# Patient Record
Sex: Female | Born: 1980
Health system: Southern US, Community
[De-identification: ages and names within clinical notes are randomized; demographics above are authoritative.]

## PROBLEM LIST (undated history)

## (undated) ENCOUNTER — Ambulatory Visit (HOSPITAL_COMMUNITY): Source: Home / Self Care

## (undated) DIAGNOSIS — O24419 Gestational diabetes mellitus in pregnancy, unspecified control: Secondary | ICD-10-CM

## (undated) DIAGNOSIS — Z5189 Encounter for other specified aftercare: Secondary | ICD-10-CM

## (undated) DIAGNOSIS — F419 Anxiety disorder, unspecified: Secondary | ICD-10-CM

## (undated) DIAGNOSIS — O139 Gestational [pregnancy-induced] hypertension without significant proteinuria, unspecified trimester: Secondary | ICD-10-CM

## (undated) DIAGNOSIS — E119 Type 2 diabetes mellitus without complications: Secondary | ICD-10-CM

## (undated) DIAGNOSIS — D696 Thrombocytopenia, unspecified: Secondary | ICD-10-CM

## (undated) DIAGNOSIS — I10 Essential (primary) hypertension: Secondary | ICD-10-CM

## (undated) HISTORY — DX: Encounter for other specified aftercare: Z51.89

## (undated) HISTORY — DX: Anxiety disorder, unspecified: F41.9

## (undated) HISTORY — DX: Gestational diabetes mellitus in pregnancy, unspecified control: O24.419

## (undated) HISTORY — DX: Gestational (pregnancy-induced) hypertension without significant proteinuria, unspecified trimester: O13.9

## (undated) NOTE — *Deleted (*Deleted)
HEMATOLOGY/ONCOLOGY CONSULTATION NOTE  Date of Service: 06/18/2020  Patient Care Team: Care, Jovita Kussmaul [facility] as PCP - General (Family Medicine)  CHIEF COMPLAINTS/PURPOSE OF CONSULTATION:  Thrombocytopenia in pregnancy  HISTORY OF PRESENTING ILLNESS:  Terri Bradford is a wonderful 58 y.o. female who has been referred to [place] by Dr Oneita Hurt for evaluation and management of thrombocytopenia in pregnancy. Pt is accompanied today by ***. The pt reports that she is doing well overall.   The pt reports ***   Of note prior to the patient's visit today, pt has had *** completed on *** with results revealing ***.   Most recent lab results (06/17/2020) of CBC is as follows: all values are WNL except for RBC at 3.10, Hgb at 10.5, HCT at 30.5, MCV at 98, MCH at 33.9, PLT at 75K.  On review of systems, pt reports *** and denies ***and any other symptoms.   On PMHx the pt reports ***. On Social Hx the pt reports *** On Family Hx the pt reports ***  A: -Discussed patient's most recent labs from ***, *** -***  MEDICAL HISTORY:  Past Medical History:  Diagnosis Date  . Anxiety   . Blood transfusion without reported diagnosis   . Diabetes mellitus without complication (HCC)   . Hypertension   . Pregnancy induced hypertension   . Thrombocytopenia, unspecified (HCC)    Thrombocytopenia    SURGICAL HISTORY: Past Surgical History:  Procedure Laterality Date  . CESAREAN SECTION      SOCIAL HISTORY: Social History   Socioeconomic History  . Marital status: Single    Spouse name: Not on file  . Number of children: 2  . Years of education: Not on file  . Highest education level: Not on file  Occupational History  . Occupation: unemployed  Tobacco Use  . Smoking status: Current Every Day Smoker    Packs/day: 0.00    Types: Cigarettes  . Smokeless tobacco: Never Used  Vaping Use  . Vaping Use: Never used  Substance and Sexual Activity  . Alcohol use: Not Currently  .  Drug use: No  . Sexual activity: Yes    Birth control/protection: None  Other Topics Concern  . Not on file  Social History Narrative  . Not on file   Social Determinants of Health   Financial Resource Strain:   . Difficulty of Paying Living Expenses: Not on file  Food Insecurity:   . Worried About Programme researcher, broadcasting/film/video in the Last Year: Not on file  . Ran Out of Food in the Last Year: Not on file  Transportation Needs:   . Lack of Transportation (Medical): Not on file  . Lack of Transportation (Non-Medical): Not on file  Physical Activity:   . Days of Exercise per Week: Not on file  . Minutes of Exercise per Session: Not on file  Stress:   . Feeling of Stress : Not on file  Social Connections:   . Frequency of Communication with Friends and Family: Not on file  . Frequency of Social Gatherings with Friends and Family: Not on file  . Attends Religious Services: Not on file  . Active Member of Clubs or Organizations: Not on file  . Attends Banker Meetings: Not on file  . Marital Status: Not on file  Intimate Partner Violence:   . Fear of Current or Ex-Partner: Not on file  . Emotionally Abused: Not on file  . Physically Abused: Not on file  .  Sexually Abused: Not on file    FAMILY HISTORY: Family History  Problem Relation Age of Onset  . Hypertension Mother   . COPD Mother   . Anxiety disorder Sister   . Diabetes Sister     ALLERGIES:  has No Known Allergies.  MEDICATIONS:  Current Outpatient Medications  Medication Sig Dispense Refill  . aspirin EC 81 MG tablet Take 1 tablet (81 mg total) by mouth daily. Take after 12 weeks for prevention of preeclampsia later in pregnancy 300 tablet 2  . Blood Pressure Monitoring (BLOOD PRESSURE KIT) DEVI 1 kit by Does not apply route once a week. Check Blood Pressure regularly and record readings into the [employer] App.  Large Cuff.  DX O90.0 1 each 0  . labetalol (NORMODYNE) 200 MG tablet Take 1 tablet (200 mg  total) by mouth 2 (two) times daily. 60 tablet 3  . metFORMIN (GLUCOPHAGE) 500 MG tablet Take 1 tablet (500 mg total) by mouth 2 (two) times daily with a meal. 60 tablet 5  . potassium chloride (MICRO-K) 10 MEQ CR capsule Take 10 mEq by mouth daily. (Patient not taking: Reported on 01/31/2020)    . Prenatal Vit-Fe Fumarate-FA (PREPLUS) 27-1 MG TABS Take 1 tablet by mouth daily. 30 tablet 13   No current facility-administered medications for this visit.    REVIEW OF SYSTEMS:    10 Point review of Systems was done is negative except as noted above.  PHYSICAL EXAMINATION: ECOG PERFORMANCE STATUS: {CHL ONC ECOG ZO:[id number]}  .There were no vitals filed for this visit. There were no vitals filed for this visit. .There is no height or weight on file to calculate BMI.  *** GENERAL:alert, in no acute distress and comfortable SKIN: no acute rashes, no significant lesions EYES: conjunctiva are pink and non-injected, sclera anicteric OROPHARYNX: MMM, no exudates, no oropharyngeal erythema or ulceration NECK: supple, no JVD LYMPH:  no palpable lymphadenopathy in the cervical, axillary or inguinal regions LUNGS: clear to auscultation b/l with normal respiratory effort HEART: regular rate & rhythm ABDOMEN:  normoactive bowel sounds , non tender, not distended. Extremity: no pedal edema PSYCH: alert & oriented x 3 with fluent speech NEURO: no focal motor/sensory deficits  LABORATORY DATA:  I have reviewed the data as listed  . CBC Latest Ref Rng & Units 06/17/2020 05/08/2020 03/26/2020  WBC 3.4 - 10.8 x10E3/uL 6.6 6.8 9.7  Hemoglobin 11.1 - 15.9 g/dL 10.5(L) 11.8 12.2  Hematocrit 34.0 - 46.6 % 30.5(L) 33.8(L) 36.1  Platelets 150 - 450 x10E3/uL 75(LL) 81(LL) 96(LL)    . CMP Latest Ref Rng & Units 05/08/2020 01/03/2020 08/24/2019  Glucose 65 - 99 mg/dL 76 811(B) 147(W)  BUN 6 - 24 mg/dL 4(L) 5(L) <2(N)  Creatinine 0.57 - 1.00 mg/dL 5.62(Z) 3.08 6.57  Sodium 134 - 144 mmol/L 142 140 134(L)   Potassium 3.5 - 5.2 mmol/L 4.0 3.9 3.6  Chloride 96 - 106 mmol/L 110(H) 107(H) 98  CO2 20 - 29 mmol/L 20 20 26   Calcium 8.7 - 10.2 mg/dL 8.4(O) 9.2 9.5  Total Protein 6.0 - 8.5 g/dL 5.9(L) 6.3 -  Total Bilirubin 0.0 - 1.2 mg/dL 0.5 0.4 -  Alkaline Phos 44 - 121 IU/L 116 68 -  AST 0 - 40 IU/L 15 11 -  ALT 0 - 32 IU/L 9 6 -   Component     Latest Ref Rng & Units 08/24/2019 01/03/2020 01/31/2020 02/28/2020  Platelets     150 - 450 x10E3/uL 154 100 (LL) 91 (LL)  95 (LL)   Component     Latest Ref Rng & Units 03/26/2020 05/08/2020 06/17/2020  Platelets     150 - 450 x10E3/uL 96 (LL) 81 (LL) 75 (LL)    RADIOGRAPHIC STUDIES: I have personally reviewed the radiological images as listed and agreed with the findings in the report. [place] MFM FETAL BPP WO NON STRESS  Result Date: 06/12/2020 ----------------------------------------------------------------------  OBSTETRICS REPORT                       (Signed Final 06/12/2020 03:27 pm) ---------------------------------------------------------------------- Patient Info  ID #:       [id number]                          D.O.B.:  April 05, 1981 (39 yrs)  Name:       Terri Bradford                   Visit Date: 06/12/2020 01:59 pm ---------------------------------------------------------------------- Performed By  Attending:        Ma Rings MD         Ref[facility] Address[facility]     [facility]                                                             [facility]  Performed By:     Reinaldo Raddle            [facility]  Referred By:      Warden Fillers MD ---------------------------------------------------------------------- Orders  #  Description  Code        Ordered By  1  [place] MFM FETAL BPP WO NON               [id number]    YU FANG     STRESS ----------------------------------------------------------------------  #  Order #                     Accession #                Episode #  1  [id number]                   [id number]                 [id number] ---------------------------------------------------------------------- Indications  Polyhydramnios, third trimester, antepartum    O40.3XX0  condition or complication, unspecified fetus  Advanced maternal age multigravida 73+,        O51.523  third trimester  Pre-existing diabetes, type 2, in pregnancy,   O24.113  third trimester (metformin)  Tobacco use complicating pregnancy, third      O99.333  trimester  Hypertension - Chronic/Pre-existing            O10.019  (Labetalol)  Poor obstetric history: Previous IUFD          O09.299  (stillbirth)  Thrombocytopenia affecting pregnancy,          O99.119, D69.6  antepartum  History of cesarean delivery, currently        O6.219  pregnant  Maternal care for known or suspected poor      O36.5930  fetal growth, third trimester, not applicable or  unspecified IUGR  Low Risk NIPS(Negative Horizon)  [redacted] weeks gestation of pregnancy                Z3A.34 ---------------------------------------------------------------------- Vital Signs                                                 Height:        5'3" ---------------------------------------------------------------------- Fetal Evaluation  Num Of Fetuses:         1  Fetal Heart Rate(bpm):  155  Cardiac Activity:       Observed  Presentation:           Cephalic  Placenta:               Posterior  P. Cord Insertion:      Visualized, central  Amniotic Fluid  AFI FV:      Subjectively increased  AFI Sum(cm)     %Tile       Largest Pocket(cm)  24.54           94          8.41  RUQ(cm)       RLQ(cm)       LUQ(cm)        LLQ(cm)  5.72          8.41          5.28           5.13  ---------------------------------------------------------------------- Biophysical Evaluation  Amniotic F.V:   Pocket => 2 cm             F. Tone:        Observed  F. Movement:    Observed  Score:          8/8  F. Breathing:   Observed ---------------------------------------------------------------------- OB History  Gravidity:    4         Term:   3  Living:       2 ---------------------------------------------------------------------- Gestational Age  LMP:           34w 5d        Date:  10/13/19                 EDD:   07/19/20  Best:          34w 5d     Det. By:  LMP  (10/13/19)          EDD:   07/19/20 ---------------------------------------------------------------------- Anatomy  Diaphragm:             Appears normal         Kidneys:                Appear normal  Stomach:               Appears normal, left   Bladder:                Appears normal                         sided ---------------------------------------------------------------------- Cervix Uterus Adnexa  Cervix  Not visualized (advanced GA >24wks) ---------------------------------------------------------------------- Comments  This patient was seen for advanced maternal age greater  than 40, pre-existing diabetes currently treated with  Metformin, history of IUFD, and chronic hypertension  currently treated with labetalol.  She denies any problems  since her last exam.  A biophysical profile performed today was 8 out of 8.  Borderline polyhydramnios continues to be noted today.  Due  to her underlying medical conditions, we will continue to  follow her with weekly fetal testing until delivery.  She reports that she will most likely be delivered at around 37  weeks.  She was advised that I agree with this  recommendation. ----------------------------------------------------------------------                   Ma Rings, MD Electronically Signed Final Report   06/12/2020 03:27 pm  ----------------------------------------------------------------------  [place] MFM FETAL BPP WO NON STRESS  Result Date: 06/05/2020 ----------------------------------------------------------------------  OBSTETRICS REPORT                       (Signed Final 06/05/2020 05:12 pm) ---------------------------------------------------------------------- Patient Info  ID #:       [id number]                          D.O.B.:  1980/08/11 (39 yrs)  Name:       Terri Bradford                   Visit Date: 06/05/2020 02:48 pm ---------------------------------------------------------------------- Performed By  Attending:        Noralee Space MD        Ref[facility]  Performed By:     Emeline Darling BS,      Location[facility]  Referred By:      Warden Fillers MD ---------------------------------------------------------------------- Orders  #  Description                           Code        Ordered By  1  [place] MFM FETAL BPP WO NON               76819.01    RAVI SHANKAR     STRESS  2  [place] MFM OB FOLLOW UP                   76816.01    RAVI [facility] ----------------------------------------------------------------------  #  Order #                     Accession #                Episode #  1  [id number]                   [id number]                 [id number]  2  [id number]                   [id number]                 [id number] ---------------------------------------------------------------------- Indications  [redacted] weeks gestation of pregnancy                Z3A.33  Polyhydramnios, third trimester, antepartum    O40.3XX0  condition or complication,  unspecified fetus  Advanced maternal age multigravida 18+,        O1.523  third trimester  Pre-existing diabetes, type 2, in pregnancy,  O24.113  third trimester (metformin)  Tobacco use complicating pregnancy, third      O99.333  trimester  Hypertension - Chronic/Pre-existing            O10.019  (Labetalol)  Poor obstetric history: Previous IUFD          O09.299  (stillbirth)  Thrombocytopenia affecting pregnancy,          O99.119, D69.6  antepartum  History of cesarean delivery, currently        O34.219  pregnant  Maternal care for known or suspected poor      O36.5930  fetal growth, third trimester, not applicable or  unspecified IUGR  Low Risk NIPS(Negative Horizon) ---------------------------------------------------------------------- Vital Signs                                                 Height:        5'3" ---------------------------------------------------------------------- Fetal Evaluation  Num Of Fetuses:         1  Fetal Heart Rate(bpm):  141  Cardiac Activity:       Observed  Presentation:           Cephalic  Placenta:               Anterior  P. Cord Insertion:      Previously Visualized  Amniotic Fluid  AFI FV:      Polyhydramnios  AFI Sum(cm)     %Tile       Largest Pocket(cm)  25.04           95          10.72  RUQ(cm)       RLQ(cm)       LUQ(cm)        LLQ(cm)  5.49          4.53          10.72          4.3 ---------------------------------------------------------------------- Biophysical Evaluation  Amniotic F.V:   Pocket => 2 cm             F. Tone:        Observed  F. Movement:    Observed                   Score:          8/8  F. Breathing:   Observed ---------------------------------------------------------------------- Biometry  BPD:      80.8  mm     G. Age:  32w 3d         14  %    CI:        71.25   %    70 - 86                                                          FL/HC:      19.4   %    19.4 - 21.8  HC:      304.9  mm     G. Age:  33w 6d         20  %    HC/AC:      1.03  0.96 - 1.11  AC:      295.9  mm     G. Age:  33w 4d         49  %    FL/BPD:     73.1   %    71 - 87  FL:       59.1  mm     G. Age:  30w 6d        < 1  %    FL/AC:      20.0   %    20 - 24  Est. FW:    2035  gm      4 lb 8 oz     17  % ---------------------------------------------------------------------- OB History  Gravidity:    4         Term:   3  Living:       2 ---------------------------------------------------------------------- Gestational Age  LMP:           33w 5d        Date:  10/13/19                 EDD:   07/19/20  U/S Today:     32w 5d                                        EDD:   07/26/20  Best:          33w 5d     Det. By:  LMP  (10/13/19)          EDD:   07/19/20 ---------------------------------------------------------------------- Anatomy  Cranium:               Appears normal         Aortic Arch:            Previously seen  Cavum:                 Previously seen        Ductal Arch:            Previously seen  Ventricles:            Appears normal         Diaphragm:              Appears normal  Choroid Plexus:        Previously seen        Stomach:                Appears normal, left                                                                        sided  Cerebellum:            Previously seen        Abdomen:                Appears normal  Posterior Fossa:       Previously seen        Abdominal Wall:         Previously seen  Nuchal Fold:  Not applicable (>20    Cord Vessels:           Previously seen                         wks GA)  Face:                  Orbits and profile     Kidneys:                Appear normal                         previously seen  Lips:                  Previously seen        Bladder:                Appears normal  Thoracic:              Appears normal         Spine:                  Previously seen  Heart:                 Previously seen        Upper Extremities:      Previously seen  RVOT:                  Previously seen        Lower Extremities:       Rt Clubfoot prev                                                                        seen  LVOT:                  Previously seen  Other:  Fetus appears to be a female. Heels and 5th digit visualized. (Rt Club          Foot) 3VV visualized. Nasal bone visualized. Technically difficult due          to fetal position. ---------------------------------------------------------------------- Cervix Uterus Adnexa  Cervix  Not visualized (advanced GA >24wks) ---------------------------------------------------------------------- Impression  Fetal growth is appropriate for gestational age . Mild  polyhydramnios is seen. Antenatal testing is reassuring. BPP  8/8.  This study was remotely read . ---------------------------------------------------------------------- Recommendations  -Continue weekly BPP till delivery. ----------------------------------------------------------------------                  Noralee Space, MD Electronically Signed Final Report   06/05/2020 05:12 pm ----------------------------------------------------------------------  [place] MFM FETAL BPP WO NON STRESS  Result Date: 05/29/2020 ----------------------------------------------------------------------  OBSTETRICS REPORT                       (Signed Final 05/29/2020 02:10 pm) ---------------------------------------------------------------------- Patient Info  ID #:       [id number]                          D.O.B.:  Dec 10, 1980 (39 yrs)  Name:       Terri Bradford  Visit Date: 05/29/2020 01:27 pm ---------------------------------------------------------------------- Performed By  Attending:        Noralee Space MD        Ref. Address:     [facility]                                                             [facility]care  Performed By:     Lenise Arena        Location[facility]  Referred By:      Warden Fillers MD ---------------------------------------------------------------------- Orders  #  Description                           Code        Ordered By  1  [place] MFM FETAL BPP WO NON               76819.01    RAVI SHANKAR     STRESS  2  [place] MFM UA CORD DOPPLER                16109.60    RAVI [facility] ----------------------------------------------------------------------  #  Order #                     Accession #                Episode #  1  [id number]                   [id number]                 [id number]  2  [id number]  [id number]                 [id number] ---------------------------------------------------------------------- Indications  Polyhydramnios, third trimester, antepartum    O40.3XX0  condition or complication, unspecified fetus  Advanced maternal age multigravida 53+,        O42.523  third trimester  Pre-existing diabetes, type 2, in pregnancy,   O24.113  third trimester (metformin)  Tobacco use complicating pregnancy, third      O99.333  trimester  Hypertension - Chronic/Pre-existing            O10.019  (Labetalol)  [redacted] weeks gestation of pregnancy                Z3A.32  Poor obstetric history: Previous IUFD          O09.299  (stillbirth)  Thrombocytopenia affecting pregnancy,          O99.119, D69.6  antepartum  History of cesarean delivery, currently        O38.219  pregnant  Maternal care for known or suspected poor      O36.5930  fetal growth, third trimester, not applicable or  unspecified IUGR  Encounter for other antenatal screening        Z36.2  follow-up  (low risk NIPS, neg Horizon) ---------------------------------------------------------------------- Vital Signs                                                 Height:        5'3" ----------------------------------------------------------------------  Fetal Evaluation  Num Of Fetuses:         1  Fetal Heart Rate(bpm):  138  Cardiac Activity:       Observed  Presentation:           Cephalic  Placenta:               Anterior  P. Cord Insertion:      Previously Visualized  Amniotic Fluid  AFI FV:      Polyhydramnios  AFI Sum(cm)     %Tile       Largest Pocket(cm)  26.47           96          10.69  RUQ(cm)       RLQ(cm)       LUQ(cm)        LLQ(cm)  6.76          5.42          10.69          3.6 ---------------------------------------------------------------------- Biophysical Evaluation  Amniotic F.V:   Within normal limits       F. Tone:        Observed  F. Movement:    Observed                   Score:          8/8  F. Breathing:   Observed ---------------------------------------------------------------------- OB History  Gravidity:    4         Term:   3  Living:       2 ---------------------------------------------------------------------- Gestational Age  LMP:           32w 5d        Date:  10/13/19  EDD:   07/19/20  Best:          Armida Sans 5d     Det. By:  LMP  (10/13/19)          EDD:   07/19/20 ---------------------------------------------------------------------- Anatomy  Thoracic:              Appears normal         Stomach:                Appears normal, left                                                                        sided  Diaphragm:             Appears normal         Bladder:                Appears normal ---------------------------------------------------------------------- Doppler - Fetal Vessels  Umbilical Artery   S/D     %tile      RI    %tile   3.09       76    0.68       80 ---------------------------------------------------------------------- Cervix Uterus Adnexa  Cervix  Not visualized (advanced GA >24wks) ---------------------------------------------------------------------- Impression  Chronic hypertension.  Patient takes labetalol 200 mg twice  daily.  Blood pressures today at her office were 142/92,  137/92 and repeat  blood pressure 126/76 mmHg.  Patient has diagnosis of pregestational diabetes and her  most recent hemoglobin A1c was 4.8%.  She takes Metformin  500 mg twice daily.  Previously seen fetal growth restriction had resolved.  On ultrasound, mild polyhydramnios is seen.Antenatal testing  is reassuring. BPP 8/8.  Umbilical artery Doppler showed  normal forward diastolic flow.  Cephalic presentation. ---------------------------------------------------------------------- Recommendations  -Fetal growth next week. If normal fetal growth is seen, UA  Doppler studies to be discontinued.  -Continue weekly BPP till delivery. ----------------------------------------------------------------------                  Noralee Space, MD Electronically Signed Final Report   05/29/2020 02:10 pm ----------------------------------------------------------------------  [place] MFM OB FOLLOW UP  Result Date: 06/05/2020 ----------------------------------------------------------------------  OBSTETRICS REPORT                       (Signed Final 06/05/2020 05:12 pm) ---------------------------------------------------------------------- Patient Info  ID #:       [id number]                          D.O.B.:  04/25/1981 (39 yrs)  Name:       Terri Bradford                   Visit Date: 06/05/2020 02:48 pm ---------------------------------------------------------------------- Performed By  Attending:        Noralee Space MD        Ref. Address[facility]     [facility]  Performed By:     Emeline Darling BS,      [facility]                                     [facility]  Referred By:      Warden Fillers MD  ---------------------------------------------------------------------- Orders  #  Description                           Code        Ordered By  1  [place] MFM FETAL BPP WO NON               76819.01    RAVI SHANKAR     STRESS  2  [place] MFM OB FOLLOW UP                   76816.01    [facility] ----------------------------------------------------------------------  #  Order #                     Accession #                Episode #  1  [id number]                   [id number]                 [id number]  2  [id number]                   [id number]                 [id number] ---------------------------------------------------------------------- Indications  [redacted] weeks gestation of pregnancy                Z3A.33  Polyhydramnios, third trimester, antepartum    O40.3XX0  condition or complication, unspecified fetus  Advanced maternal age multigravida 42+,        O23.523  third trimester  Pre-existing diabetes, type 2, in pregnancy,  O24.113  third trimester (metformin)  Tobacco use complicating pregnancy, third      O99.333  trimester  Hypertension - Chronic/Pre-existing            O10.019  (Labetalol)  Poor obstetric history: Previous IUFD          O09.299  (stillbirth)  Thrombocytopenia affecting pregnancy,          O99.119, D69.6  antepartum  History of cesarean delivery, currently        O34.219  pregnant  Maternal care for known or suspected poor      O36.5930  fetal growth, third trimester, not applicable or  unspecified IUGR  Low Risk NIPS(Negative Horizon) ---------------------------------------------------------------------- Vital Signs                                                 Height:        5'3" ---------------------------------------------------------------------- Fetal Evaluation  Num Of Fetuses:         1  Fetal Heart Rate(bpm):  141  Cardiac Activity:       Observed  Presentation:           Cephalic  Placenta:               Anterior  P. Cord Insertion:      Previously Visualized  Amniotic Fluid  AFI FV:       Polyhydramnios  AFI Sum(cm)     %Tile       Largest Pocket(cm)  25.04           95          10.72  RUQ(cm)       RLQ(cm)       LUQ(cm)        LLQ(cm)  5.49          4.53          10.72          4.3 ---------------------------------------------------------------------- Biophysical Evaluation  Amniotic F.V:   Pocket => 2 cm             F. Tone:        Observed  F. Movement:    Observed                   Score:          8/8  F. Breathing:   Observed ---------------------------------------------------------------------- Biometry  BPD:      80.8  mm     G. Age:  32w 3d         14  %    CI:        71.25   %    70 - 86                                                          FL/HC:      19.4   %    19.4 - 21.8  HC:      304.9  mm     G. Age:  33w 6d         20  %    HC/AC:  1.03        0.96 - 1.11  AC:      295.9  mm     G. Age:  33w 4d         49  %    FL/BPD:     73.1   %    71 - 87  FL:       59.1  mm     G. Age:  30w 6d        < 1  %    FL/AC:      20.0   %    20 - 24  Est. FW:    2035  gm      4 lb 8 oz     17  % ---------------------------------------------------------------------- OB History  Gravidity:    4         Term:   3  Living:       2 ---------------------------------------------------------------------- Gestational Age  LMP:           33w 5d        Date:  10/13/19                 EDD:   07/19/20  U/S Today:     32w 5d                                        EDD:   07/26/20  Best:          33w 5d     Det. By:  LMP  (10/13/19)          EDD:   07/19/20 ---------------------------------------------------------------------- Anatomy  Cranium:               Appears normal         Aortic Arch:            Previously seen  Cavum:                 Previously seen        Ductal Arch:            Previously seen  Ventricles:            Appears normal         Diaphragm:              Appears normal  Choroid Plexus:        Previously seen        Stomach:                Appears normal, left                                                                         sided  Cerebellum:            Previously seen        Abdomen:                Appears normal  Posterior Fossa:       Previously seen        Abdominal Wall:  Previously seen  Nuchal Fold:           Not applicable (>20    Cord Vessels:           Previously seen                         wks GA)  Face:                  Orbits and profile     Kidneys:                Appear normal                         previously seen  Lips:                  Previously seen        Bladder:                Appears normal  Thoracic:              Appears normal         Spine:                  Previously seen  Heart:                 Previously seen        Upper Extremities:      Previously seen  RVOT:                  Previously seen        Lower Extremities:      Rt Clubfoot prev                                                                        seen  LVOT:                  Previously seen  Other:  Fetus appears to be a female. Heels and 5th digit visualized. (Rt Club          Foot) 3VV visualized. Nasal bone visualized. Technically difficult due          to fetal position. ---------------------------------------------------------------------- Cervix Uterus Adnexa  Cervix  Not visualized (advanced GA >24wks) ---------------------------------------------------------------------- Impression  Fetal growth is appropriate for gestational age . Mild  polyhydramnios is seen. Antenatal testing is reassuring. BPP  8/8.  This study was remotely read . ---------------------------------------------------------------------- Recommendations  -Continue weekly BPP till delivery. ----------------------------------------------------------------------                  Noralee Space, MD Electronically Signed Final Report   06/05/2020 05:12 pm ----------------------------------------------------------------------  [place] MFM UA CORD DOPPLER  Result Date: 05/29/2020  ----------------------------------------------------------------------  OBSTETRICS REPORT                       (Signed Final 05/29/2020 02:10 pm) ---------------------------------------------------------------------- Patient Info  ID #:       [id number]                          D.O.B.:  12/27/1980 (39  yrs)  Name:       Terri Bradford                   Visit Date: 05/29/2020 01:27 pm ---------------------------------------------------------------------- Performed By  Attending:        Noralee Space MD        Ref. Address[facility]     [facility]                                                             [facility]  Performed By:     Lenise Arena        Location[facility]  Referred By:      Warden Fillers MD ---------------------------------------------------------------------- Orders  #  Description                           Code        Ordered By  1  [place] MFM FETAL BPP WO NON               76819.01    [facility]VI SHANKAR     STRESS  2  [place] MFM UA CORD DOPPLER                21308.65    [facility]VI [facility] ----------------------------------------------------------------------  #  Order #                     Accession #                Episode #  1  [id number]  [id number]                 [id number]  2  [id number]                   [id number]                 [id number] ---------------------------------------------------------------------- Indications  Polyhydramnios, third trimester, antepartum    O40.3XX0  condition or complication, unspecified fetus  Advanced maternal age multigravida 39+,        O69.523  third trimester  Pre-existing diabetes, type 2, in pregnancy,   O24.113  third trimester (metformin)  Tobacco  use complicating pregnancy, third      O99.333  trimester  Hypertension - Chronic/Pre-existing            O10.019  (Labetalol)  [redacted] weeks gestation of pregnancy                Z3A.32  Poor obstetric history: Previous IUFD          O09.299  (stillbirth)  Thrombocytopenia affecting pregnancy,          O99.119, D69.6  antepartum  History of cesarean delivery, currently        O59.219  pregnant  Maternal care for known or suspected poor      O36.5930  fetal growth, third trimester, not applicable or  unspecified IUGR  Encounter for other antenatal screening        Z36.2  follow-up  (low risk NIPS, neg Horizon) ---------------------------------------------------------------------- Vital Signs                                                 Height:        5'3" ---------------------------------------------------------------------- Fetal Evaluation  Num Of Fetuses:         1  Fetal Heart Rate(bpm):  138  Cardiac Activity:       Observed  Presentation:           Cephalic  Placenta:               Anterior  P. Cord Insertion:      Previously Visualized  Amniotic Fluid  AFI FV:      Polyhydramnios  AFI Sum(cm)     %Tile       Largest Pocket(cm)  26.47           96          10.69  RUQ(cm)       RLQ(cm)       LUQ(cm)        LLQ(cm)  6.76          5.42          10.69          3.6 ---------------------------------------------------------------------- Biophysical Evaluation  Amniotic F.V:   Within normal limits       F. Tone:        Observed  F. Movement:    Observed                   Score:          8/8  F. Breathing:   Observed ---------------------------------------------------------------------- OB History  Gravidity:    4         Term:   3  Living:       2 ---------------------------------------------------------------------- Gestational  Age  LMP:           32w 5d        Date:  10/13/19                 EDD:   07/19/20  Best:          Armida Sans 5d     Det. By:  LMP  (10/13/19)          EDD:   07/19/20  ---------------------------------------------------------------------- Anatomy  Thoracic:              Appears normal         Stomach:                Appears normal, left                                                                        sided  Diaphragm:             Appears normal         Bladder:                Appears normal ---------------------------------------------------------------------- Doppler - Fetal Vessels  Umbilical Artery   S/D     %tile      RI    %tile   3.09       76    0.68       80 ---------------------------------------------------------------------- Cervix Uterus Adnexa  Cervix  Not visualized (advanced GA >24wks) ---------------------------------------------------------------------- Impression  Chronic hypertension.  Patient takes labetalol 200 mg twice  daily.  Blood pressures today at her office were 142/92,  137/92 and repeat blood pressure 126/76 mmHg.  Patient has diagnosis of pregestational diabetes and her  most recent hemoglobin A1c was 4.8%.  She takes Metformin  500 mg twice daily.  Previously seen fetal growth restriction had resolved.  On ultrasound, mild polyhydramnios is seen.Antenatal testing  is reassuring. BPP 8/8.  Umbilical artery Doppler showed  normal forward diastolic flow.  Cephalic presentation. ---------------------------------------------------------------------- Recommendations  -Fetal growth next week. If normal fetal growth is seen, UA  Doppler studies to be discontinued.  -Continue weekly BPP till delivery. ----------------------------------------------------------------------                  Noralee Space, MD Electronically Signed Final Report   05/29/2020 02:10 pm ----------------------------------------------------------------------   ASSESSMENT & PLAN:   PLAN: ***  FOLLOW UP: ***   All of the patients questions were answered with apparent satisfaction. The patient knows to call the clinic with any problems, questions or concerns.  I spent  *** counseling the patient face to face. The total time spent in the appointment was *** and more than 50% was on counseling and direct patient cares.    Wyvonnia Lora MD MS AAHIVMS [facility] Hematology/Oncology Physician [facility]  (Office):       [phone] (Work cell):  [phone] (Fax):           [phone]  06/18/2020 8:02 AM  I, Carollee Herter, am acting as a scribe for Dr. Wyvonnia Lora.   {Add Production assistant, radio Statement}

## (undated) NOTE — *Deleted (*Deleted)
[name] CONSULT NOTE  Patient Care Team: Care, Jovita Kussmaul [facility] as PCP - General (Family Medicine)  CHIEF COMPLAINTS/PURPOSE OF CONSULTATION:  Newly diagnosed thrombocytopenia   HISTORY OF PRESENTING ILLNESS:  Terri Bradford 64 y.o. female is here because of recent diagnosis of thrombocytopenia. She is currently [redacted] weeks pregnant. She is referred by the [facility]. Labs on 03/26/20 showed platelets 96, on 05/08/20 showed platelets 81, and on 06/17/20 showed platelets 75. She presents to the clinic today for initial evaluation.   I reviewed her records extensively and collaborated the history with the patient.  MEDICAL HISTORY:  Past Medical History:  Diagnosis Date  . Anxiety   . Blood transfusion without reported diagnosis   . Diabetes mellitus without complication (HCC)   . Hypertension   . Pregnancy induced hypertension   . Thrombocytopenia, unspecified (HCC)    Thrombocytopenia    SURGICAL HISTORY: Past Surgical History:  Procedure Laterality Date  . CESAREAN SECTION      SOCIAL HISTORY: Social History   Socioeconomic History  . Marital status: Single    Spouse name: Not on file  . Number of children: 2  . Years of education: Not on file  . Highest education level: Not on file  Occupational History  . Occupation: unemployed  Tobacco Use  . Smoking status: Current Every Day Smoker    Packs/day: 0.00    Types: Cigarettes  . Smokeless tobacco: Never Used  Vaping Use  . Vaping Use: Never used  Substance and Sexual Activity  . Alcohol use: Not Currently  . Drug use: No  . Sexual activity: Yes    Birth control/protection: None  Other Topics Concern  . Not on file  Social History Narrative  . Not on file   Social Determinants of Health   Financial Resource Strain:   . Difficulty of Paying Living Expenses: Not on file  Food Insecurity:   . Worried About Programme researcher, broadcasting/film/video in the Last Year: Not on file  . Ran Out  of Food in the Last Year: Not on file  Transportation Needs:   . Lack of Transportation (Medical): Not on file  . Lack of Transportation (Non-Medical): Not on file  Physical Activity:   . Days of Exercise per Week: Not on file  . Minutes of Exercise per Session: Not on file  Stress:   . Feeling of Stress : Not on file  Social Connections:   . Frequency of Communication with Friends and Family: Not on file  . Frequency of Social Gatherings with Friends and Family: Not on file  . Attends Religious Services: Not on file  . Active Member of Clubs or Organizations: Not on file  . Attends Banker Meetings: Not on file  . Marital Status: Not on file  Intimate Partner Violence:   . Fear of Current or Ex-Partner: Not on file  . Emotionally Abused: Not on file  . Physically Abused: Not on file  . Sexually Abused: Not on file    FAMILY HISTORY: Family History  Problem Relation Age of Onset  . Hypertension Mother   . COPD Mother   . Anxiety disorder Sister   . Diabetes Sister     ALLERGIES:  has No Known Allergies.  MEDICATIONS:  Current Outpatient Medications  Medication Sig Dispense Refill  . aspirin EC 81 MG tablet Take 1 tablet (81 mg total) by mouth daily. Take after 12 weeks for prevention of preeclampsia later  in pregnancy 300 tablet 2  . Blood Pressure Monitoring (BLOOD PRESSURE KIT) DEVI 1 kit by Does not apply route once a week. Check Blood Pressure regularly and record readings into the Bab[employer] App.  Large Cuff.  DX O90.0 1 each 0  . labetalol (NORMODYNE) 200 MG tablet Take 1 tablet (200 mg total) by mouth 2 (two) times daily. 60 tablet 3  . metFORMIN (GLUCOPHAGE) 500 MG tablet Take 1 tablet (500 mg total) by mouth 2 (two) times daily with a meal. 60 tablet 5  . potassium chloride (MICRO-K) 10 MEQ CR capsule Take 10 mEq by mouth daily. (Patient not taking: Reported on 01/31/2020)    . Prenatal Vit-Fe Fumarate-FA (PREPLUS) 27-1 MG TABS Take 1 tablet by mouth  daily. 30 tablet 13   No current facility-administered medications for this visit.    REVIEW OF SYSTEMS:   Constitutional: Denies fevers, chills or abnormal night sweats Eyes: Denies blurriness of vision, double vision or watery eyes Ears, nose, mouth, throat, and face: Denies mucositis or sore throat Respiratory: Denies cough, dyspnea or wheezes Cardiovascular: Denies palpitation, chest discomfort or lower extremity swelling Gastrointestinal:  Denies nausea, heartburn or change in bowel habits Skin: Denies abnormal skin rashes Lymphatics: Denies new lymphadenopathy or easy bruising Neurological:Denies numbness, tingling or new weaknesses Behavioral/Psych: Mood is stable, no new changes  Breast: Denies any palpable lumps or discharge All other systems were reviewed with the patient and are negative.  PHYSICAL EXAMINATION: ECOG PERFORMANCE STATUS: {CHL ONC ECOG PS:[phone]}  There were no vitals filed for this visit. There were no vitals filed for this visit.  GENERAL:alert, no distress and comfortable SKIN: skin color, texture, turgor are normal, no rashes or significant lesions EYES: normal, conjunctiva are pink and non-injected, sclera clear OROPHARYNX:no exudate, no erythema and lips, buccal mucosa, and tongue normal  NECK: supple, thyroid normal size, non-tender, without nodularity LYMPH:  no palpable lymphadenopathy in the cervical, axillary or inguinal LUNGS: clear to auscultation and percussion with normal breathing effort HEART: regular rate & rhythm and no murmurs and no lower extremity edema ABDOMEN:abdomen soft, non-tender and normal bowel sounds Musculoskeletal:no cyanosis of digits and no clubbing  PSYCH: alert & oriented x 3 with fluent speech NEURO: no focal motor/sensory deficits  LABORATORY DATA:  I have reviewed the data as listed Lab Results  Component Value Date   WBC 6.6 06/17/2020   HGB 10.5 (L) 06/17/2020   HCT 30.5 (L) 06/17/2020   MCV 98 (H)  06/17/2020   PLT 75 (LL) 06/17/2020   Lab Results  Component Value Date   NA 142 05/08/2020   K 4.0 05/08/2020   CL 110 (H) 05/08/2020   CO2 20 05/08/2020    RADIOGRAPHIC STUDIES: I have personally reviewed the radiological reports and agreed with the findings in the report.  ASSESSMENT AND PLAN:  No problem-specific Assessment & Plan notes found for this encounter.   All questions were answered. The patient knows to call the clinic with any problems, questions or concerns.   Sabas Sous, MD, MPH 06/18/2020    I, Molly Dorshimer, am acting as scribe for Serena Croissant, MD.  {Add scribe attestation statement}

---
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
## 1998-01-07 ENCOUNTER — Ambulatory Visit (HOSPITAL_COMMUNITY): Admission: RE | Admit: 1998-01-07 | Discharge: 1998-01-07 | Payer: Self-pay | Admitting: Obstetrics & Gynecology

## 1998-02-10 ENCOUNTER — Inpatient Hospital Stay (HOSPITAL_COMMUNITY): Admission: AD | Admit: 1998-02-10 | Discharge: 1998-02-15 | Payer: Self-pay | Admitting: *Deleted

## 1998-02-18 ENCOUNTER — Inpatient Hospital Stay (HOSPITAL_COMMUNITY): Admission: RE | Admit: 1998-02-18 | Discharge: 1998-02-18 | Payer: Self-pay | Admitting: *Deleted

## 1999-08-05 ENCOUNTER — Inpatient Hospital Stay (HOSPITAL_COMMUNITY): Admission: AD | Admit: 1999-08-05 | Discharge: 1999-08-05 | Payer: Self-pay | Admitting: Obstetrics

## 1999-10-10 ENCOUNTER — Emergency Department (HOSPITAL_COMMUNITY): Admission: EM | Admit: 1999-10-10 | Discharge: 1999-10-10 | Payer: Self-pay | Admitting: Emergency Medicine

## 2003-09-26 ENCOUNTER — Emergency Department (HOSPITAL_COMMUNITY): Admission: EM | Admit: 2003-09-26 | Discharge: 2003-09-26 | Payer: Self-pay | Admitting: Emergency Medicine

## 2004-03-17 ENCOUNTER — Emergency Department (HOSPITAL_COMMUNITY): Admission: EM | Admit: 2004-03-17 | Discharge: 2004-03-17 | Payer: Self-pay | Admitting: Emergency Medicine

## 2005-10-05 ENCOUNTER — Emergency Department (HOSPITAL_COMMUNITY): Admission: EM | Admit: 2005-10-05 | Discharge: 2005-10-05 | Payer: Self-pay | Admitting: Emergency Medicine

## 2005-11-11 ENCOUNTER — Emergency Department (HOSPITAL_COMMUNITY): Admission: EM | Admit: 2005-11-11 | Discharge: 2005-11-11 | Payer: Self-pay | Admitting: Emergency Medicine

## 2006-05-31 ENCOUNTER — Emergency Department (HOSPITAL_COMMUNITY): Admission: EM | Admit: 2006-05-31 | Discharge: 2006-05-31 | Payer: Self-pay | Admitting: Family Medicine

## 2007-02-13 ENCOUNTER — Emergency Department (HOSPITAL_COMMUNITY): Admission: EM | Admit: 2007-02-13 | Discharge: 2007-02-13 | Payer: Self-pay | Admitting: Family Medicine

## 2008-04-11 ENCOUNTER — Emergency Department (HOSPITAL_COMMUNITY): Admission: EM | Admit: 2008-04-11 | Discharge: 2008-04-11 | Payer: Self-pay | Admitting: Family Medicine

## 2008-06-17 ENCOUNTER — Emergency Department (HOSPITAL_COMMUNITY): Admission: EM | Admit: 2008-06-17 | Discharge: 2008-06-17 | Payer: Self-pay | Admitting: Family Medicine

## 2009-01-17 ENCOUNTER — Emergency Department (HOSPITAL_COMMUNITY): Admission: EM | Admit: 2009-01-17 | Discharge: 2009-01-17 | Payer: Self-pay | Admitting: Emergency Medicine

## 2009-06-02 ENCOUNTER — Emergency Department (HOSPITAL_COMMUNITY): Admission: EM | Admit: 2009-06-02 | Discharge: 2009-06-03 | Payer: Self-pay | Admitting: Emergency Medicine

## 2010-01-08 ENCOUNTER — Emergency Department (HOSPITAL_COMMUNITY): Admission: EM | Admit: 2010-01-08 | Discharge: 2010-01-08 | Payer: Self-pay | Admitting: Emergency Medicine

## 2010-10-19 LAB — URINE CULTURE
Colony Count: NO GROWTH
Culture: NO GROWTH

## 2010-10-19 LAB — POCT URINALYSIS DIP (DEVICE)
Bilirubin Urine: NEGATIVE
Glucose, UA: NEGATIVE mg/dL
Hgb urine dipstick: NEGATIVE
Ketones, ur: NEGATIVE mg/dL
Nitrite: NEGATIVE
Protein, ur: NEGATIVE mg/dL
Specific Gravity, Urine: >=1.03 (ref 1.005–1.030)
Urobilinogen, UA: 0.2 mg/dL (ref 0.0–1.0)
pH: 5.5 (ref 5.0–8.0)

## 2010-10-19 LAB — POCT PREGNANCY, URINE: Preg Test, Ur: NEGATIVE

## 2010-11-04 LAB — RAPID URINE DRUG SCREEN, HOSP PERFORMED
Amphetamines: NOT DETECTED
Barbiturates: NOT DETECTED
Benzodiazepines: NOT DETECTED
Cocaine: NOT DETECTED
Opiates: NOT DETECTED
Tetrahydrocannabinol: NOT DETECTED

## 2010-11-04 LAB — CK TOTAL AND CKMB (NOT AT ARMC)
CK, MB: 1.1 ng/mL (ref 0.3–4.0)
Relative Index: 0.8 (ref 0.0–2.5)
Total CK: 144 U/L (ref 7–177)

## 2010-11-04 LAB — TROPONIN I: Troponin I: 0.01 ng/mL (ref 0.00–0.06)

## 2010-11-09 LAB — POCT CARDIAC MARKERS
CKMB, poc: <1 ng/mL — ABNORMAL LOW (ref 1.0–8.0)
Myoglobin, poc: 382 ng/mL (ref 12–200)
Troponin i, poc: <0.05 ng/mL (ref 0.00–0.09)

## 2010-11-09 LAB — BASIC METABOLIC PANEL
BUN: 6 mg/dL (ref 6–23)
CO2: 26 mEq/L (ref 19–32)
Calcium: 9.7 mg/dL (ref 8.4–10.5)
Chloride: 106 mEq/L (ref 96–112)
Creatinine, Ser: 0.89 mg/dL (ref 0.4–1.2)
GFR calc Af Amer: >60 mL/min (ref >60)
GFR calc non Af Amer: >60 mL/min (ref >60)
Glucose, Bld: 141 mg/dL — ABNORMAL HIGH (ref 70–99)
Potassium: 3.7 mEq/L (ref 3.5–5.1)
Sodium: 140 mEq/L (ref 135–145)

## 2010-11-09 LAB — CBC
HCT: 45 % (ref 36.0–46.0)
Hemoglobin: 15.5 g/dL — ABNORMAL HIGH (ref 12.0–15.0)
MCHC: 34.4 g/dL (ref 30.0–36.0)
MCV: 91.4 fL (ref 78.0–100.0)
Platelets: 123 10*3/uL — ABNORMAL LOW (ref 150–400)
RBC: 4.92 MIL/uL (ref 3.87–5.11)
RDW: 12.6 % (ref 11.5–15.5)
WBC: 9.6 10*3/uL (ref 4.0–10.5)

## 2010-11-09 LAB — DIFFERENTIAL
Basophils Absolute: 0.1 10*3/uL (ref 0.0–0.1)
Basophils Relative: 1 % (ref 0–1)
Eosinophils Absolute: 0.1 10*3/uL (ref 0.0–0.7)
Eosinophils Relative: 1 % (ref 0–5)
Lymphocytes Relative: 37 % (ref 12–46)
Lymphs Abs: 3.5 10*3/uL (ref 0.7–4.0)
Monocytes Absolute: 0.8 10*3/uL (ref 0.1–1.0)
Monocytes Relative: 9 % (ref 3–12)
Neutro Abs: 5.1 10*3/uL (ref 1.7–7.7)
Neutrophils Relative %: 53 % (ref 43–77)

## 2011-01-12 ENCOUNTER — Inpatient Hospital Stay (INDEPENDENT_AMBULATORY_CARE_PROVIDER_SITE_OTHER)
Admission: RE | Admit: 2011-01-12 | Discharge: 2011-01-12 | Disposition: A | Discharge status: Home / Self Care | Payer: Self-pay | Source: Ambulatory Visit | Attending: Family Medicine | Admitting: Family Medicine

## 2011-01-12 DIAGNOSIS — S335XXA Sprain of ligaments of lumbar spine, initial encounter: Secondary | ICD-10-CM

## 2011-01-12 LAB — POCT PREGNANCY, URINE: Preg Test, Ur: NEGATIVE

## 2011-01-12 LAB — POCT URINALYSIS DIP (DEVICE)
Bilirubin Urine: NEGATIVE
Glucose, UA: 250 mg/dL — AB
Hgb urine dipstick: NEGATIVE
Ketones, ur: NEGATIVE mg/dL
Leukocytes, UA: NEGATIVE
Nitrite: POSITIVE — AB
Protein, ur: 30 mg/dL — AB
Specific Gravity, Urine: >=1.03 (ref 1.005–1.030)
Urobilinogen, UA: 0.2 mg/dL (ref 0.0–1.0)
pH: 5.5 (ref 5.0–8.0)

## 2011-01-15 ENCOUNTER — Inpatient Hospital Stay (INDEPENDENT_AMBULATORY_CARE_PROVIDER_SITE_OTHER)
Admission: RE | Admit: 2011-01-15 | Discharge: 2011-01-15 | Disposition: A | Discharge status: Home / Self Care | Payer: Medicaid Other | Source: Ambulatory Visit | Attending: Family Medicine | Admitting: Family Medicine

## 2011-01-15 DIAGNOSIS — K006 Disturbances in tooth eruption: Secondary | ICD-10-CM

## 2011-01-15 DIAGNOSIS — K047 Periapical abscess without sinus: Secondary | ICD-10-CM

## 2011-05-05 LAB — POCT PREGNANCY, URINE: Preg Test, Ur: NEGATIVE

## 2011-07-13 ENCOUNTER — Encounter (HOSPITAL_COMMUNITY): Payer: Self-pay | Admitting: *Deleted

## 2011-07-13 ENCOUNTER — Other Ambulatory Visit: Payer: Self-pay

## 2011-07-13 ENCOUNTER — Emergency Department (INDEPENDENT_AMBULATORY_CARE_PROVIDER_SITE_OTHER)
Admission: EM | Admit: 2011-07-13 | Discharge: 2011-07-13 | Disposition: A | Discharge status: Home / Self Care | Payer: Medicaid Other | Source: Home / Self Care

## 2011-07-13 ENCOUNTER — Encounter: Payer: Self-pay | Admitting: *Deleted

## 2011-07-13 ENCOUNTER — Emergency Department (HOSPITAL_COMMUNITY)
Admission: EM | Admit: 2011-07-13 | Discharge: 2011-07-14 | Disposition: A | Discharge status: Home / Self Care | Payer: Medicaid Other | Attending: Emergency Medicine | Admitting: Emergency Medicine

## 2011-07-13 ENCOUNTER — Emergency Department (HOSPITAL_COMMUNITY)
Admission: EM | Admit: 2011-07-13 | Discharge: 2011-07-13 | Discharge status: Against Medical Advice | Payer: Medicaid Other | Source: Home / Self Care

## 2011-07-13 DIAGNOSIS — M5410 Radiculopathy, site unspecified: Secondary | ICD-10-CM

## 2011-07-13 DIAGNOSIS — R079 Chest pain, unspecified: Secondary | ICD-10-CM | POA: Insufficient documentation

## 2011-07-13 DIAGNOSIS — R1013 Epigastric pain: Secondary | ICD-10-CM | POA: Insufficient documentation

## 2011-07-13 DIAGNOSIS — IMO0002 Reserved for concepts with insufficient information to code with codable children: Secondary | ICD-10-CM

## 2011-07-13 DIAGNOSIS — K219 Gastro-esophageal reflux disease without esophagitis: Secondary | ICD-10-CM | POA: Insufficient documentation

## 2011-07-13 DIAGNOSIS — M79609 Pain in unspecified limb: Secondary | ICD-10-CM | POA: Insufficient documentation

## 2011-07-13 DIAGNOSIS — E119 Type 2 diabetes mellitus without complications: Secondary | ICD-10-CM | POA: Insufficient documentation

## 2011-07-13 DIAGNOSIS — I10 Essential (primary) hypertension: Secondary | ICD-10-CM | POA: Insufficient documentation

## 2011-07-13 DIAGNOSIS — F17200 Nicotine dependence, unspecified, uncomplicated: Secondary | ICD-10-CM | POA: Insufficient documentation

## 2011-07-13 HISTORY — DX: Essential (primary) hypertension: I10

## 2011-07-13 MED ORDER — IBUPROFEN 800 MG PO TABS: 800.0000 mg | ORAL_TABLET | Freq: Three times a day (TID) | ORAL | Status: AC

## 2011-07-13 MED ORDER — GI COCKTAIL ~~LOC~~
30.0000 mL | Freq: Once | ORAL | Status: AC
  Administered 2011-07-14: 30 mL via ORAL
  Filled 2011-07-13: qty 30

## 2011-07-13 NOTE — ED Notes (Signed)
To ed for eval of right leg burning, intermittent in severity, for the past cple of months. Denies injury. Ambulatory without difficulty.

## 2011-07-13 NOTE — ED Provider Notes (Signed)
History     CSN: [id number] Arrival date & time: 07/13/2011  3:40 PM   None     Chief Complaint  Patient presents with  . Leg Pain    (Consider location/radiation/quality/duration/timing/severity/associated sxs/prior treatment) HPI Comments: Burning pain Rt thigh x 1-2 mos, worse in the last week. Pain is fairly constant, may go away for few minutes but then comes right back. Has had LBP in the past but denies LBP currently.  Was seen previously at [facility] on [address] and was given stretches to do. "There aren't any stretches that are going to take burning out of my leg." Pt has not taken anything for her discomfort.   Patient is a 30 y.o. female presenting with leg pain. The history is provided by the patient.  Leg Pain  The incident occurred more than 1 week ago. There was no injury mechanism. The pain is present in the right thigh. The pain is at a severity of 8/10. The pain has been constant since onset. Pertinent negatives include no numbness, no muscle weakness and no tingling. The symptoms are aggravated by nothing.    Past Medical History  Diagnosis Date  . Diabetes mellitus     History reviewed. No pertinent past surgical history.  History reviewed. No pertinent family history.  History  Substance Use Topics  . Smoking status: Not on file  . Smokeless tobacco: Not on file  . Alcohol Use:     OB History    Grav Para Term Preterm Abortions TAB SAB Ect Mult Living                  Review of Systems  Constitutional: Negative for fever and chills.  Musculoskeletal: Negative for back pain and joint swelling.  Neurological: Negative for tingling, weakness and numbness.    Allergies  Review of patient's allergies indicates no known allergies.  Home Medications   Current Outpatient Rx  Name Route Sig Dispense Refill  . LISINOPRIL-HYDROCHLOROTHIAZIDE 10-12.5 MG PO TABS Oral Take 1 tablet by mouth daily.      Marland Kitchen VITAMIN D (ERGOCALCIFEROL) 50000 UNITS PO  CAPS Oral Take 50,000 Units by mouth every 7 (seven) days. Thursdays       BP 132/73  Pulse 73  Temp(Src) 98 F (36.7 C) (Oral)  Resp 16  SpO2 100%  Physical Exam  Nursing note and vitals reviewed. Constitutional: She appears well-developed and well-nourished. No distress.  Cardiovascular: Normal rate, regular rhythm and normal heart sounds.   Pulses:      Dorsalis pedis pulses are 2+ on the right side, and 2+ on the left side.       Posterior tibial pulses are 2+ on the right side, and 2+ on the left side.  Pulmonary/Chest: Effort normal and breath sounds normal. No respiratory distress.  Musculoskeletal:       Lumbar back: She exhibits tenderness. She exhibits normal range of motion, no swelling and no spasm.       Back:  Neurological: She is alert. She has normal strength and normal reflexes. No sensory deficit. Gait normal.  Reflex Scores:      Patellar reflexes are 2+ on the right side and 2+ on the left side. Skin: Skin is warm and dry.  Psychiatric: She has a normal mood and affect.    ED Course  Procedures (including critical care time)  Labs Reviewed - No data to display No results found.   No diagnosis found.    MDM  Rt  lumbosacral TTP on exam with burning parasthesias Rt thigh. Pt has previous visits for LBP, though currently denies LBP.  Pt referred to ortho for further eval.        Melody Comas, PA 07/13/11 [phone]

## 2011-07-13 NOTE — ED Notes (Signed)
Received pt. From EMS to room PTO 8, pt. Alert and oriented, NAD noted, pt. Seen here in ED earlier today and discharged home

## 2011-07-13 NOTE — ED Provider Notes (Signed)
Medical screening examination/treatment/procedure(s) were performed by non-physician practitioner and as supervising physician I was immediately available for consultation/collaboration.  Hillery Hunter, MD 07/13/11 [phone]

## 2011-07-13 NOTE — ED Notes (Signed)
Pt  Reports  Pain  r  Thigh      X  approx   1   Week     She  denys  Any  specefic  Injury     -  She  Reports  The  Pain is  Like  A  Burning  Sensation         -   She  Is  Ambulatory  And   Appears  In no  Distress

## 2011-07-14 LAB — D-DIMER, QUANTITATIVE: D-Dimer, Quant: 0.32 ug/mL-FEU (ref 0.00–0.48)

## 2011-07-14 MED ORDER — ALUM & MAG HYDROXIDE-SIMETH 500-450-40 MG/5ML PO SUSP: 15.0000 mL | Freq: Four times a day (QID) | ORAL | Status: AC | PRN

## 2011-07-14 MED ORDER — RANITIDINE HCL 150 MG PO TABS: 150.0000 mg | ORAL_TABLET | Freq: Two times a day (BID) | ORAL | Status: DC

## 2011-07-14 NOTE — ED Provider Notes (Addendum)
I saw and evaluated the patient, reviewed the resident's note and I agree with the findings and plan.  abd soft, lungs clear, no tachycardia, no edema  Vida Roller, MD 07/14/11 1610  Vida Roller, MD 07/14/11 [id number]

## 2011-07-14 NOTE — ED Notes (Signed)
Pt. Alert and oriented, NAD noted respirations even and regular, pt. Ambulatory, NAD noted

## 2011-07-14 NOTE — ED Provider Notes (Signed)
History     CSN: [id number] Arrival date & time: 07/13/2011 10:11 PM   First MD Initiated Contact with Patient 07/13/11 2230      Chief Complaint  Patient presents with  . Chest Pain    (Consider location/radiation/quality/duration/timing/severity/associated sxs/prior treatment) The history is provided by the patient and a parent.   Patient is a 30 year old female who presents with right lower extremity pain and epigastric pain. Right lower extremity pain has been intermittent for the past week. She describes it as mild to moderate discomfort. He tends to be worse with using her leg. It is not significantly changed in the last week. She denies preceding trauma. No low sugar swelling or redness. No recent fever. Patient started having epigastric discomfort. She notes it to be burning sensation. It has been intermittent today. Most recently started after eating a barbecue sandwich. It is worse when she lays flat and better when she sits up. She has not had any trouble breathing. She had mild palpitations at one point but that has resolved. No syncope. She said this epigastric discomfort before and knows not to be from acid reflux. The overall severity is described as mild to moderate.  Past Medical History  Diagnosis Date  . Diabetes mellitus   . Hypertension     History reviewed. No pertinent past surgical history.  History reviewed. No pertinent family history.  History  Substance Use Topics  . Smoking status: Current Everyday Smoker -- 0.5 packs/day    Types: Cigarettes  . Smokeless tobacco: Not on file  . Alcohol Use: Yes    OB History    Grav Para Term Preterm Abortions TAB SAB Ect Mult Living                  Review of Systems  Constitutional: Negative for fever and chills.  HENT: Negative for facial swelling.   Eyes: Negative for visual disturbance.  Respiratory: Negative for cough, chest tightness, shortness of breath and wheezing.   Gastrointestinal: Negative  for nausea, vomiting, abdominal pain and diarrhea.  Genitourinary: Negative for difficulty urinating.  Skin: Negative for rash.  Neurological: Negative for weakness and numbness.  Psychiatric/Behavioral: Negative for behavioral problems and confusion.  All other systems reviewed and are negative.    Allergies  Review of patient's allergies indicates no known allergies.  Home Medications   Current Outpatient Rx  Name Route Sig Dispense Refill  . IBUPROFEN 800 MG PO TABS Oral Take 1 tablet (800 mg total) by mouth 3 (three) times daily. 15 tablet 0  . LISINOPRIL-HYDROCHLOROTHIAZIDE 10-12.5 MG PO TABS Oral Take 1 tablet by mouth daily.      Marland Kitchen VITAMIN D (ERGOCALCIFEROL) 50000 UNITS PO CAPS Oral Take 50,000 Units by mouth every 7 (seven) days. Thursdays     . ALUM & MAG HYDROXIDE-SIMETH 500-450-40 MG/5ML PO SUSP Oral Take 15 mLs by mouth every 6 (six) hours as needed for indigestion. 355 mL 0  . RANITIDINE HCL 150 MG PO TABS Oral Take 1 tablet (150 mg total) by mouth 2 (two) times daily. 30 tablet 0    BP 130/92  Pulse 86  Temp(Src) 98.7 F (37.1 C) (Oral)  Resp 18  SpO2 99%  Physical Exam  Nursing note and vitals reviewed. Constitutional: She is oriented to person, place, and time. She appears well-developed and well-nourished. No distress.  HENT:  Head: Normocephalic.  Nose: Nose normal.  Eyes: EOM are normal.  Neck: Normal range of motion. Neck supple.  Cardiovascular: Normal rate,  regular rhythm and intact distal pulses.   No murmur heard. Pulmonary/Chest: Effort normal and breath sounds normal. No respiratory distress. She has no wheezes. She exhibits no tenderness.  Abdominal: Soft. She exhibits no distension. There is no tenderness.  Musculoskeletal: Normal range of motion. She exhibits no edema and no tenderness.       No calf TTP  Neurological: She is alert and oriented to person, place, and time.       Normal strength  Skin: Skin is warm and dry. No rash noted. She  is not diaphoretic.  Psychiatric: She has a normal mood and affect. Her behavior is normal. Thought content normal.    ED Course  Procedures (including critical care time)   Date: 07/13/2011  Rate: 70  Rhythm: normal sinus rhythm  QRS Axis: normal  Intervals: normal  ST/T Wave abnormalities: nonspecific T wave changes  Conduction Disutrbances:none  Narrative Interpretation: no acute ischemia  Old EKG Reviewed: unchanged     Labs Reviewed  D-DIMER, QUANTITATIVE   No results found.   1. Acid reflux       MDM   Clinical picture is consistent with acid reflux. No  abdominal tenderness on exam. These postprandial symptoms have not been present for a long period.  No historical or clinical evidence of pancreatic or gallbladder etiology. Also doubt gastritis and PUD based on nature pain and nontender abdomen.   Her lower extremity is without swelling, cellulitis, difficulty ranging, or significant tenderness next to palpation. Patient does not take estrogen containing medications nor have history of prior clotting disorder. No other stroke risk factors for DVT. She is therefore low risk by [place] for DVT and PE.  With atypical symptoms involving lower extremity and epigastric/lower chest area, d-dimer ordered. It was negative thereby excluding DVT and PE.  Patient notes complete resolution in her symptoms after GI cocktail. Discussed diagnoses as well as importance of PCP follow up. Return precautions discussed.        Milus Glazier 07/14/11 0141

## 2011-07-19 LAB — GLUCOSE, CAPILLARY: Glucose-Capillary: 126 mg/dL — ABNORMAL HIGH (ref 70–99)

## 2011-11-28 ENCOUNTER — Encounter (HOSPITAL_COMMUNITY): Payer: Self-pay | Admitting: Emergency Medicine

## 2011-11-28 ENCOUNTER — Emergency Department (HOSPITAL_COMMUNITY)
Admission: EM | Admit: 2011-11-28 | Discharge: 2011-11-28 | Disposition: A | Discharge status: Home / Self Care | Payer: Medicaid Other | Attending: Emergency Medicine | Admitting: Emergency Medicine

## 2011-11-28 DIAGNOSIS — R221 Localized swelling, mass and lump, neck: Secondary | ICD-10-CM | POA: Insufficient documentation

## 2011-11-28 DIAGNOSIS — T7840XA Allergy, unspecified, initial encounter: Secondary | ICD-10-CM

## 2011-11-28 DIAGNOSIS — M5416 Radiculopathy, lumbar region: Secondary | ICD-10-CM

## 2011-11-28 DIAGNOSIS — R220 Localized swelling, mass and lump, head: Secondary | ICD-10-CM | POA: Insufficient documentation

## 2011-11-28 DIAGNOSIS — IMO0002 Reserved for concepts with insufficient information to code with codable children: Secondary | ICD-10-CM | POA: Insufficient documentation

## 2011-11-28 DIAGNOSIS — E119 Type 2 diabetes mellitus without complications: Secondary | ICD-10-CM | POA: Insufficient documentation

## 2011-11-28 DIAGNOSIS — T46905A Adverse effect of unspecified agents primarily affecting the cardiovascular system, initial encounter: Secondary | ICD-10-CM | POA: Insufficient documentation

## 2011-11-28 DIAGNOSIS — I10 Essential (primary) hypertension: Secondary | ICD-10-CM | POA: Insufficient documentation

## 2011-11-28 DIAGNOSIS — R51 Headache: Secondary | ICD-10-CM

## 2011-11-28 DIAGNOSIS — F17200 Nicotine dependence, unspecified, uncomplicated: Secondary | ICD-10-CM | POA: Insufficient documentation

## 2011-11-28 DIAGNOSIS — R229 Localized swelling, mass and lump, unspecified: Secondary | ICD-10-CM | POA: Insufficient documentation

## 2011-11-28 LAB — GLUCOSE, CAPILLARY: Glucose-Capillary: 72 mg/dL (ref 70–99)

## 2011-11-28 MED ORDER — HYDROCHLOROTHIAZIDE 25 MG PO TABS: 25.0000 mg | ORAL_TABLET | Freq: Every day | ORAL | Status: DC

## 2011-11-28 NOTE — ED Notes (Signed)
Pt c/o "tingling" to top of head, upper lip tingling,tongue tingling as well. Pt also c/o R leg burning.

## 2011-11-28 NOTE — ED Provider Notes (Signed)
History     CSN: [id number]  Arrival date & time 11/28/11  1911   First MD Initiated Contact with Patient 11/28/11 1943      Chief Complaint  Patient presents with  . Headache    (Consider location/radiation/quality/duration/timing/severity/associated sxs/prior treatment) Patient is a 31 y.o. female presenting with headaches. The history is provided by the patient.  Headache  This is a new problem. The current episode started 6 to 12 hours ago. The problem occurs constantly. The problem has been gradually improving. The headache is associated with nothing. The quality of the pain is described as dull. The pain is at a severity of 4/10. The pain is mild. Pertinent negatives include no fever.  Pt states what she is very concerned about is the fact that her tongue and lip feel numb and swollen, states tingling. Pt denies swelling of throat or difficulty breathing. States thinks headache was caused by this. These symptoms started today. Pt also reports right thigh numbness that has been there for "years." No chage, no new injury.  Past Medical History  Diagnosis Date  . Diabetes mellitus   . Hypertension     Past Surgical History  Procedure Date  . Cesarean section     No family history on file.  History  Substance Use Topics  . Smoking status: Current Everyday Smoker -- 0.5 packs/day    Types: Cigarettes  . Smokeless tobacco: Not on file  . Alcohol Use: Yes     2 weeks    OB History    Grav Para Term Preterm Abortions TAB SAB Ect Mult Living                  Review of Systems  Constitutional: Negative for fever and chills.  HENT: Positive for facial swelling. Negative for congestion, sore throat, trouble swallowing, neck pain, neck stiffness and voice change.   Eyes: Negative.   Respiratory: Negative.   Cardiovascular: Negative.   Gastrointestinal: Negative.   Genitourinary: Negative.   Musculoskeletal: Negative.   Skin: Negative.   Neurological: Positive for  numbness and headaches. Negative for weakness.    Allergies  Review of patient's allergies indicates no known allergies.  Home Medications   Current Outpatient Rx  Name Route Sig Dispense Refill  . LISINOPRIL-HYDROCHLOROTHIAZIDE 10-12.5 MG PO TABS Oral Take 1 tablet by mouth daily.      Marland Kitchen RANITIDINE HCL 150 MG PO TABS Oral Take 1 tablet (150 mg total) by mouth 2 (two) times daily. 30 tablet 0  . VITAMIN D (ERGOCALCIFEROL) 50000 UNITS PO CAPS Oral Take 50,000 Units by mouth every 7 (seven) days. Thursdays       BP 124/77  Pulse 73  Temp(Src) 98.6 F (37 C) (Oral)  Resp 18  Ht 5\' 1"  (1.549 m)  Wt 134 lb 9.6 oz (61.054 kg)  BMI 25.43 kg/m2  SpO2 100%  LMP 11/28/2011  Physical Exam  Nursing note and vitals reviewed. Constitutional: She is oriented to person, place, and time. She appears well-developed and well-nourished. No distress.  HENT:  Head: Normocephalic and atraumatic.  Right Ear: External ear normal.  Left Ear: External ear normal.  Nose: Nose normal.  Mouth/Throat: Oropharynx is clear and moist.       No obvious swelling of the tongue or lips. Uvula normal, non edemoutous  Eyes: Conjunctivae and EOM are normal. Pupils are equal, round, and reactive to light.  Neck: Normal range of motion. Neck supple.  Cardiovascular: Normal rate, regular rhythm and normal  heart sounds.   Pulmonary/Chest: Effort normal and breath sounds normal. No respiratory distress. She has no wheezes.  Musculoskeletal:       Lumbar spine non tender. Slightly decreased sensation to the right lateral thigh compared to left. Pain with right straight leg raise  Neurological: She is alert and oriented to person, place, and time. No cranial nerve deficit. Coordination normal.  Skin: Skin is warm and dry.  Psychiatric: She has a normal mood and affect.    ED Course  Procedures (including critical care time)  Pt with multiple complaints. She is in NAD, non toxic. VS all within normal. i think her  tongue swelling and lip swelling may be caused by lisinorpil. Although i do not see swelling on exam, pt states they do not feel the same, will stop lisinopril. Pt's thigh discomfort has been there for over a year, and she has not injured it again. I suspect this may be related to her back. She is neurovascularly intact with no red flags to suggest cauda equina. Pt asked for cbg to be checked bc was pre diabetic before and has not had it checked in "a long time." checked and it is normal at 72. Will d/c home with outpatient follow up.   1. Allergic reaction to drug   2. Headache   3. Lumbar radiculopathy       MDM          Lottie Mussel, PA 11/29/11 0007

## 2011-11-28 NOTE — Discharge Instructions (Signed)
Your blood sugar here is normal. I think your lip swelling and tingling is caused by lisinopril. Stop this medication. Take hydrochlorothiazide as prescribed. Follow up with your doctor for recheck. Follow up for further evaluation of your thigh pain. Return if worsening.

## 2011-11-29 NOTE — ED Provider Notes (Signed)
Medical screening examination/treatment/procedure(s) were performed by non-physician practitioner and as supervising physician I was immediately available for consultation/collaboration.   Devario Bucklew M Niki Cosman, MD 11/29/11 0023 

## 2012-07-21 ENCOUNTER — Encounter (HOSPITAL_COMMUNITY): Payer: Self-pay | Admitting: Adult Health

## 2012-07-21 ENCOUNTER — Emergency Department (HOSPITAL_COMMUNITY): Payer: Medicaid Other

## 2012-07-21 ENCOUNTER — Emergency Department (HOSPITAL_COMMUNITY)
Admission: EM | Admit: 2012-07-21 | Discharge: 2012-07-22 | Disposition: A | Discharge status: Home / Self Care | Payer: Medicaid Other | Attending: Emergency Medicine | Admitting: Emergency Medicine

## 2012-07-21 DIAGNOSIS — B957 Other staphylococcus as the cause of diseases classified elsewhere: Secondary | ICD-10-CM | POA: Insufficient documentation

## 2012-07-21 DIAGNOSIS — N760 Acute vaginitis: Secondary | ICD-10-CM | POA: Insufficient documentation

## 2012-07-21 DIAGNOSIS — I10 Essential (primary) hypertension: Secondary | ICD-10-CM | POA: Insufficient documentation

## 2012-07-21 DIAGNOSIS — F17200 Nicotine dependence, unspecified, uncomplicated: Secondary | ICD-10-CM | POA: Insufficient documentation

## 2012-07-21 DIAGNOSIS — O2390 Unspecified genitourinary tract infection in pregnancy, unspecified trimester: Secondary | ICD-10-CM | POA: Insufficient documentation

## 2012-07-21 DIAGNOSIS — Z3490 Encounter for supervision of normal pregnancy, unspecified, unspecified trimester: Secondary | ICD-10-CM

## 2012-07-21 DIAGNOSIS — O9989 Other specified diseases and conditions complicating pregnancy, childbirth and the puerperium: Secondary | ICD-10-CM | POA: Insufficient documentation

## 2012-07-21 DIAGNOSIS — Z79899 Other long term (current) drug therapy: Secondary | ICD-10-CM | POA: Insufficient documentation

## 2012-07-21 DIAGNOSIS — O26899 Other specified pregnancy related conditions, unspecified trimester: Secondary | ICD-10-CM

## 2012-07-21 DIAGNOSIS — R350 Frequency of micturition: Secondary | ICD-10-CM | POA: Insufficient documentation

## 2012-07-21 DIAGNOSIS — B9689 Other specified bacterial agents as the cause of diseases classified elsewhere: Secondary | ICD-10-CM

## 2012-07-21 DIAGNOSIS — E119 Type 2 diabetes mellitus without complications: Secondary | ICD-10-CM | POA: Insufficient documentation

## 2012-07-21 DIAGNOSIS — R109 Unspecified abdominal pain: Secondary | ICD-10-CM

## 2012-07-21 LAB — POCT PREGNANCY, URINE: Preg Test, Ur: POSITIVE — AB

## 2012-07-21 LAB — URINALYSIS, ROUTINE W REFLEX MICROSCOPIC
Glucose, UA: NEGATIVE mg/dL
Hgb urine dipstick: NEGATIVE
Ketones, ur: 15 mg/dL — AB
Leukocytes, UA: NEGATIVE
Nitrite: NEGATIVE
Protein, ur: NEGATIVE mg/dL
Specific Gravity, Urine: 1.033 — ABNORMAL HIGH (ref 1.005–1.030)
Urobilinogen, UA: 1 mg/dL (ref 0.0–1.0)
pH: 5.5 (ref 5.0–8.0)

## 2012-07-21 LAB — GLUCOSE, CAPILLARY: Glucose-Capillary: 96 mg/dL (ref 70–99)

## 2012-07-21 LAB — WET PREP, GENITAL
Trich, Wet Prep: NONE SEEN
Yeast Wet Prep HPF POC: NONE SEEN

## 2012-07-21 NOTE — ED Provider Notes (Signed)
History     CSN: [id number]  Arrival date & time 07/21/12  1953   First MD Initiated Contact with Patient 07/21/12 2249      Chief Complaint  Patient presents with  . Abdominal Pain    (Consider location/radiation/quality/duration/timing/severity/associated sxs/prior treatment) HPI Comments: 31 year old female presents to the emergency department complaining of lower abdominal pain for the past 3-4 weeks worsening today while she was standing at work. Describes the pain as cramping and intermittent and worse with movement. Admits to associated white vaginal discharge and increased urinary frequency. Denies nausea, vomiting, dysuria, hematuria, vaginal bleeding. Last menstrual period was about 2-3 months ago. She does not think pregnancy She is sexually active with one partner and not on any birth control. She does not use protection. No fever or chills. Denies back pain.  Patient is a 31 y.o. female presenting with abdominal pain. The history is provided by the patient.  Abdominal Pain The primary symptoms of the illness include abdominal pain and vaginal discharge. The primary symptoms of the illness do not include fever, shortness of breath, dysuria or vaginal bleeding.  The vaginal discharge is not associated with dysuria.   Additional symptoms associated with the illness include frequency. Symptoms associated with the illness do not include chills, hematuria or back pain.    Past Medical History  Diagnosis Date  . Diabetes mellitus   . Hypertension     Past Surgical History  Procedure Date  . Cesarean section     History reviewed. No pertinent family history.  History  Substance Use Topics  . Smoking status: Current Every Day Smoker -- 0.5 packs/day    Types: Cigarettes  . Smokeless tobacco: Not on file  . Alcohol Use: Yes     Comment: 2 weeks    OB History    Grav Para Term Preterm Abortions TAB SAB Ect Mult Living                  Review of Systems   Constitutional: Negative for fever, chills and appetite change.  HENT: Negative.   Respiratory: Negative for shortness of breath.   Cardiovascular: Negative for chest pain.  Gastrointestinal: Positive for abdominal pain.  Genitourinary: Positive for frequency, vaginal discharge and menstrual problem. Negative for dysuria, hematuria, vaginal bleeding and vaginal pain.  Musculoskeletal: Negative for back pain.  Skin: Negative.   Neurological: Negative for dizziness, light-headedness and headaches.  Psychiatric/Behavioral: Negative for confusion.    Allergies  Review of patient's allergies indicates no known allergies.  Home Medications   Current Outpatient Rx  Name  Route  Sig  Dispense  Refill  . LISINOPRIL-HYDROCHLOROTHIAZIDE 10-12.5 MG PO TABS   Oral   Take 1 tablet by mouth daily.             BP 113/79  Pulse 87  Temp 98.7 F (37.1 C) (Oral)  Resp 16  SpO2 99%  Physical Exam  Nursing note and vitals reviewed. Constitutional: She is oriented to person, place, and time. She appears well-developed and well-nourished. No distress.  HENT:  Head: Normocephalic and atraumatic.  Mouth/Throat: Oropharynx is clear and moist.  Eyes: Conjunctivae normal and EOM are normal. Pupils are equal, round, and reactive to light. No scleral icterus.  Neck: Normal range of motion. Neck supple.  Cardiovascular: Normal rate, regular rhythm and normal heart sounds.   Pulmonary/Chest: Effort normal and breath sounds normal.  Abdominal: Soft. Normal appearance and bowel sounds are normal. There is tenderness in the right lower quadrant  and suprapubic area.  Genitourinary: Uterus is enlarged and tender. Cervix exhibits discharge. Cervix exhibits no motion tenderness and no friability. Right adnexum displays tenderness. Right adnexum displays no mass and no fullness. Left adnexum displays no mass, no tenderness and no fullness. No erythema, tenderness or bleeding around the vagina. Vaginal  discharge (thick, while, copious) found.  Musculoskeletal: Normal range of motion. She exhibits no edema.  Neurological: She is alert and oriented to person, place, and time.  Skin: Skin is warm and dry.  Psychiatric: She has a normal mood and affect.    ED Course  Procedures (including critical care time)  Labs Reviewed  URINALYSIS, ROUTINE W REFLEX MICROSCOPIC - Abnormal; Notable for the following:    Color, Urine AMBER (*)  BIOCHEMICALS MAY BE AFFECTED BY COLOR   APPearance CLOUDY (*)     Specific Gravity, Urine 1.033 (*)     Bilirubin Urine SMALL (*)     Ketones, ur 15 (*)     All other components within normal limits  POCT PREGNANCY, URINE - Abnormal; Notable for the following:    Preg Test, Ur POSITIVE (*)     All other components within normal limits  WET PREP, GENITAL - Abnormal; Notable for the following:    Clue Cells Wet Prep HPF POC MODERATE (*)     WBC, Wet Prep HPF POC FEW (*)     All other components within normal limits  GLUCOSE, CAPILLARY  GC/CHLAMYDIA PROBE AMP   US Ob Limited  07/22/2012  *RADIOLOGY REPORT*  Clinical Data: Lower abdominal pain.  Positive pregnancy test.  LIMITED OBSTETRIC ULTRASOUND  Number of Fetuses: 1 Heart Rate: 144 bpm Movement: Present Presentation: Breech Placental Location: Anterior Previa: None. Amniotic Fluid (Subjective): Normal.  Vertical Pocket 3.3 cm   BPD:  3.3 cm     16 w  1 d         EDC: 01/04/2013  MATERNAL FINDINGS: Cervix: Closed Uterus/Adnexae:  Normal.  IMPRESSION: Single viable intrauterine pregnancy.  No complicating features.  Recommend followup with non-emergent complete OB 14+ wk US examination for fetal biometric evaluation and anatomic survey if not already performed.   Original Report Authenticated By: Andreas Newport, M.D.      1. Pregnancy   2. Abdominal pain in pregnancy   3. Bacterial vaginosis       MDM  31 y/o female with 16 week and 1 day viable IUP. No ectopic pregnancy. Patient in NAD. Wet prep with  moderate clue cells. Will treat for BV with flagyl. Patient has OB/GYN Dr. Gaynell Face. Prescribed prenatal vitamins. Return precautions discussed.        Trevor Mace, PA-C 07/22/12 Rich Fuchs

## 2012-07-21 NOTE — ED Notes (Signed)
Pt c/o lower abd/pelvi pain X 3 weeks. Pt reports its has been progressively worse. Thinks LMP was 2 months ago. Pt reports more vaginal d/c thank normal - thick white color. Pt also reports she has had a cough x 3 weeks. Pt denies n/v/d.

## 2012-07-21 NOTE — ED Notes (Signed)
Pt stated that she has been having lower abdominal pain x 2 months. She stated that she has been having white discharge for 2 months. She stated that her LMP 2 months ago. Pt pregnancy test is positive. No bleeding. Will continue to monitor.

## 2012-07-21 NOTE — ED Notes (Signed)
Presents with lower abdominal pain that has been hurting for 3 weeks but is worse today. Unknown last menstrual period. C/o white vaginal discharge, denies odor. Pain is worse with movement. Pain is described as achy. Reports more frequent urination and feeling like "i have to go all the time" denies nausea, vomiting and diarrhea.

## 2012-07-22 MED ORDER — PRENATAL COMPLETE 14-0.4 MG PO TABS: 14.0000 mg | ORAL_TABLET | Freq: Every day | ORAL | Status: DC

## 2012-07-22 MED ORDER — METRONIDAZOLE 500 MG PO TABS
500.0000 mg | ORAL_TABLET | Freq: Once | ORAL | Status: AC
  Administered 2012-07-22: 500 mg via ORAL
  Filled 2012-07-22: qty 1

## 2012-07-22 MED ORDER — METRONIDAZOLE 500 MG PO TABS: 500.0000 mg | ORAL_TABLET | Freq: Two times a day (BID) | ORAL | Status: DC

## 2012-07-22 NOTE — ED Provider Notes (Signed)
Medical screening examination/treatment/procedure(s) were performed by non-physician practitioner and as supervising physician I was immediately available for consultation/collaboration.   Glynn Octave, MD 07/22/12 Moses Manners

## 2012-07-23 LAB — GC/CHLAMYDIA PROBE AMP
CT Probe RNA: NEGATIVE
GC Probe RNA: NEGATIVE

## 2012-08-02 NOTE — L&D Delivery Note (Signed)
Delivery Note At 12:33 AM a viable female was delivered via Vaginal, Spontaneous Delivery (Presentation: ;  ).  APGAR: , ; weight .   Placenta status: , .  Cord:  with the following complications: .  Cord pH: not done  Anesthesia: Epidural  Episiotomy:  Lacerations:  Suture Repair: 2.0 vicryl Est. Blood Loss (mL):   Mom to postpartum.  Baby to nursery-stable.  MARSHALL,BERNARD A 12/20/2012, 12:47 AM

## 2012-08-07 ENCOUNTER — Other Ambulatory Visit: Payer: Self-pay

## 2012-08-07 LAB — OB RESULTS CONSOLE RUBELLA ANTIBODY, IGM: Rubella: NON-IMMUNE/NOT IMMUNE

## 2012-08-07 LAB — OB RESULTS CONSOLE ABO/RH: RH Type: POSITIVE

## 2012-08-07 LAB — OB RESULTS CONSOLE GC/CHLAMYDIA
Chlamydia: NEGATIVE
Gonorrhea: NEGATIVE

## 2012-08-07 LAB — OB RESULTS CONSOLE RPR: RPR: NONREACTIVE

## 2012-08-07 LAB — OB RESULTS CONSOLE HIV ANTIBODY (ROUTINE TESTING): HIV: NONREACTIVE

## 2012-08-07 LAB — OB RESULTS CONSOLE ANTIBODY SCREEN: Antibody Screen: NEGATIVE

## 2012-08-07 LAB — OB RESULTS CONSOLE HEPATITIS B SURFACE ANTIGEN: Hepatitis B Surface Ag: NEGATIVE

## 2012-08-08 ENCOUNTER — Other Ambulatory Visit (HOSPITAL_COMMUNITY): Payer: Self-pay | Admitting: Obstetrics

## 2012-08-08 ENCOUNTER — Encounter (HOSPITAL_COMMUNITY): Payer: Self-pay | Admitting: Obstetrics

## 2012-08-08 DIAGNOSIS — O10919 Unspecified pre-existing hypertension complicating pregnancy, unspecified trimester: Secondary | ICD-10-CM

## 2012-08-08 DIAGNOSIS — Z3689 Encounter for other specified antenatal screening: Secondary | ICD-10-CM

## 2012-08-15 ENCOUNTER — Ambulatory Visit (HOSPITAL_COMMUNITY)
Admission: RE | Admit: 2012-08-15 | Discharge: 2012-08-15 | Disposition: A | Discharge status: Home / Self Care | Payer: Medicaid Other | Source: Ambulatory Visit | Attending: Obstetrics | Admitting: Obstetrics

## 2012-08-15 ENCOUNTER — Encounter (HOSPITAL_COMMUNITY): Payer: Self-pay

## 2012-08-15 ENCOUNTER — Other Ambulatory Visit (HOSPITAL_COMMUNITY): Payer: Medicaid Other

## 2012-08-15 DIAGNOSIS — IMO0001 Reserved for inherently not codable concepts without codable children: Secondary | ICD-10-CM

## 2012-08-15 DIAGNOSIS — O10919 Unspecified pre-existing hypertension complicating pregnancy, unspecified trimester: Secondary | ICD-10-CM

## 2012-08-15 DIAGNOSIS — O09299 Supervision of pregnancy with other poor reproductive or obstetric history, unspecified trimester: Secondary | ICD-10-CM | POA: Insufficient documentation

## 2012-08-15 DIAGNOSIS — Z3689 Encounter for other specified antenatal screening: Secondary | ICD-10-CM

## 2012-08-15 DIAGNOSIS — O99330 Smoking (tobacco) complicating pregnancy, unspecified trimester: Secondary | ICD-10-CM | POA: Insufficient documentation

## 2012-08-15 DIAGNOSIS — Z363 Encounter for antenatal screening for malformations: Secondary | ICD-10-CM | POA: Insufficient documentation

## 2012-08-15 DIAGNOSIS — Z1389 Encounter for screening for other disorder: Secondary | ICD-10-CM | POA: Insufficient documentation

## 2012-08-15 DIAGNOSIS — O34219 Maternal care for unspecified type scar from previous cesarean delivery: Secondary | ICD-10-CM | POA: Insufficient documentation

## 2012-08-15 DIAGNOSIS — O358XX0 Maternal care for other (suspected) fetal abnormality and damage, not applicable or unspecified: Secondary | ICD-10-CM | POA: Insufficient documentation

## 2012-08-15 DIAGNOSIS — O10019 Pre-existing essential hypertension complicating pregnancy, unspecified trimester: Secondary | ICD-10-CM | POA: Insufficient documentation

## 2012-08-15 NOTE — Progress Notes (Addendum)
Maternal Fetal Medicine Consultation  Requesting Provider(s): Dr Gaynell Face  Primary OB: Dr Gaynell Face Reason for consultation: 32 yo G3 P2001 at 19 5/7 weeks, by 2nd trimester U/S, patient is unsure of LMP, and just found out she was pregnant around 16 weeks. HPI: Previous IUFD at 77 weeks, CHTN, and ? Diabetes  OB History: 17 years ago: G1 FT SVD 40 weeks, no complications 12-13 years ago: G2 FT 39 weeks, can not recollect the events but remembers having CTX and no VB, but received up to 5 U RBC. Denied trauma. Unsure as to why she had a C/S, but sound like possible abruption. G3 present PMH:  CHTN-2 years, was on Lisinopril 10 mg QD. stopped 1 week ago. Patient didn't know she was pregnant.  Diabetes dx 1 year ago and was never on meds, only diet controlled. Lost 20 lbs and was d/ch from the clinic bc" everything was fine". Since then has had no f/u. PSH: C/S x1 Meds: PNV Allergies: NKDA FH: Denies HTN, DM, cancer, birth defects Soc: Tobacco smoker 1 pack q2days. Drank ETOH up until 15-16 weeks, and stopped immediately she found out that she was pregnant. Denies etoh, drug use.  Review of Systems: no vaginal bleeding or cramping/contractions, no LOF, no nausea/vomiting, reports good fetal movement. All other systems reviewed and are negative.  PNL: see chart.   PE:  VS: BP      114/71        pulse         80          Weight 141 lbs GEN: well-appearing female ABD: gravid, NT  Please see separate document for fetal ultrasound report.  A/P: 32 yo G3 P2001 at 19 5/7 weeks, with a Previous IUFD at 39 weeks, CHTN, Low platelet count (125K), ETOH use in early pregnancy, smoking, and ? Diabetes  Unexplained previous IUFD at 39 weeks:  Various possible causes of fetal demise including but not limited to infections, chromosomal and genetic abnormalities, smoking, maternal medical disorders, cord event, placental abruption, pre eclampsia, and others. I have no records or work up for fetal or  maternal complications that may have resulted in the fetal death, therefore I cannot fully counsel her or accurate recurrence risk.  The patient was counseled on the recurrence risk based on the IUFD being unexplained, and recommendations were made for :  -Antiphospholipid antibodies (lupus anticoagulant, anticardiolipin antibodies, Beta 2 glycoprotein antibodies) studies were reviewed and found to be normal range. We do not recommend heparin or Lovenox at this time.  -If phospholipid antibody tests return and are negative, we can also consider Asprin 81mg  daily. Based on limited data, there appears to be some benefit if initiated in early pregnancy. She understands our discussion of risks and unproven benefits.  -Fetal surveillance:   -Interval serial growth ultrasounds (every 4 weeks).  -Antepartum testing to begin at 32 weeks: twice weekly non-stress tests, with weekly AFI, or twice weekly biophysical profile assessment.   -Testing may be initiated prior to stated dates if any abnormalities of growth, fluid, placental blood flow, or worsening maternal status are discovered.  -Fetal kick counts to begin at 24-26 weeks, were reviewed as well as documentation of at least 4-6 movements/hour, with presentation to L&D with any abnormality in normal movements or a decrease in amount of activity.  -We do not recommend delivery prior to 39 weeks in the absence of other complications, but since she has CHTN, consider delivery at 37 weeks.   Chronic  Hypertension and lisinopril use:  Preexisting chronic hypertension increases the risk of hypertensive complications during pregnancy including but not limited to: preeclampsia (PEC), PIH, growth restriction, oligohydramnios, and preterm delivery. After [redacted] weeks gestation the goal of antihypertensive treatment is to prevent stroke with target blood pressures less than 150/100. The patient is currently taking labetalol which has a good safety profile in pregnancy.  With a history of possible preeclampsia during prior pregnancies her estimated risk of recurrent toxemia approaches 25-35%.   Recommendations: -Detailed anatomical evaluation  -Fetal surveillance should be as stated above.  -We recommend obtaining baseline studies: EKG, 24 hour urine for total protein, CBC, AST, ALT, BUN, creatinine, and uric acid.  -Prenatal compression stockings  -Recommend never restarting Lisinopril in pregnancy, and if needs any medication, we recommend Labetalol.  If needed, increase incrementally to maximum dose 800 three times daily, and if necessary to add another agent, we suggest Procardia once daily dosing to begin at 30mg . If need to give > 60mg  daily, we recommend switching to twice daily doses to a maximum total dose of 120mg  per day. Please call us at any time if you need clarification, or further advice.  -Recommend delivery at 39 weeks if remains stable, or sooner if clinically indicated.   Suspected past history of Type 2 Diabetes With diabetes there is an increased risk of anatomical birth defects including congenital heart defects and neural tube defects. In addition, there is an increased risk of pregnancy complications including but not limited to: hypertensive disorders of pregnancy, worsening of glycemic control and renal function, preterm delivery, and need for cesarean section. Fetal and neonatal issues related to diabetes include but are not limited to: growth abnormalities neonatal hypoglycemia, hyperbilirubinemia, thrombocytopenia as well as macrosomia, polyhydramnios and increased risk of fetal demise.  This patient appears to have a good understanding of the goals of glycemic control via diet,  Has maintained her 20 lb weight loss.   Recommendations:  -Early screening for gestational diabetes. -Fetal surveillance should be as stated above.  -Fetal echocardiography should be performed, and it was ordered today.  -Because diabetic retinopathy can  worsen during pregnancy, it is recommended to have a comprehensive eye exam with an ophthalmologist for baseline and repeated each trimester.  -Diabetic pregnancies also have an increased risk of Pre-eclampsia, and the possibility of pre existing kidney dysfunction, and I would recommend having a baseline labs for PEC as stated above.  -Strict fetal kick counts with log documentation to begin at 24-26 weeks and continue until delivery.    Gestational thrombocytopenia: Patient reports that she has normal platelet counts, and has never had a problem with this before. Diagnosis, is likely gestational thrombocytopenia and not ITP.  Gestational thrombocytopenia is usually mild, and common (5% pregnancies), and accounts for over 75% of thrombocytopenia's in pregnancy. The cause is usually unclear, but may be an acceleration in the pattern of increased platelet breakdown in pregnancy. Women with this diagnosis are usually healthy, and usually have no increased risk for fetal bleeding in pregnancy (vs ITP where maternal antibodies cross over to the fetus). In gestational thrombocytopenia, platelet counts return to normal after delivery.   Asymptomatic pregnant women with thrombocytopenia and platelet counts greater than 50,000/L do not require treatment.   The American Society of Hematology ITP Practice Guideline Panel recommends treating pregnant women with platelet counts between 10,000 and 30,000/L during the second or third trimester. More aggressive treatment is often pursued close to the estimated due date. Some anesthesiologists may require  a platelet count greater than 80,000/L for placement of an epidural catheter and we would recommend consultation with anesthesia prior to delivery.  Corticosteroid drugs have been the cornerstone of ITP therapy in pregnancy. Prednisone, 1 to 1.5 mg/kg/day is the initial treatment of choice. Improvement usually occurs within 3 to 7 days and reaches a maximum within 2 to  3 weeks. If platelet counts become normal, the steroid dose can be tapered by 10% to 20% per week until the lowest dosage required to maintain the platelet count higher than 50,000/L is reached.  IVIG is used in cases of ITP refractory to corticosteroids as well as in urgent circumstances, such as preoperatively, in the peripartum period, or when the platelet count is less than 10,000/L (or <30,000/L in a bleeding patient). Hematology consultation should be considered prior to IVIG use.  Platelet transfusions should be considered only as a temporary measure to control life-threatening hemorrhage or to prepare a patient for cesarean delivery or other surgery. Survival of transfused platelets is decreased in patients with ITP, because antiplatelet antibodies also bind to platelets. Therefore, the usual elevation in platelets of approximately 10,000/L per unit of platelet concentrate is not achieved in patients with ITP. A transfusion of 8 to 10 packs is sufficient in most cases. Patients should also be counseled about the infectious risk associated with platelet transfusion.  Recommendations: Serial platelet counts q4 weeks until 32 weeks, and afterwards q2 weeks until delivery.  If at any time they are less than 80K, we recommend checking them q1 weekly. Interval should be shortened at any time if severe thrombocytopenia is noted. If platelet count is <50K, Corticosteroid treatment is recommended.  During this pregnancy we would recommend fetal surveillance by serial sonographic evaluation of fetal growth monthly and twice weekly nonstress tests after [redacted] weeks gestation if significant thrombocytopenia is encountered.   Tobacco use: Cessation was advised and discussed with patient.  She is trying to "cut down".  Effects of smoking on fetus and pregnancy were discussed with patient including low birth weight, growth restriction, placental abruption, poor maternal wound healing, increased post partum  pulmonary complications, increased neonatal and maternal morbidity, increased SIDS, and association with childhood leukemia.  Patient states that she will think about her options and use assistance if she is unable to stop.   Recommendations: -Transdermal or oral nicotine should be considered if the patient desires cessation.  Substance use: Alcohol We discussed the implications of substance use and exposure to the fetus. Risks of illicit substance use during pregnancy include but are not limited to: fetal anomalies, brain damage, developmental delay, fetal withdrawal syndromes, fetal alcohol syndrome, need for NICU care, preterm delivery, PPROM, and abruption. She stated that she has stopped and has no desire to restart use.   Recommendations: -Fetal echocardiogram -random urine drug screens, with referral to social services if abnormal.   Case reviewed with Dr Claudean Severance Patient plans to return Thursday for her lab draw, and will also bring in her 24 hr urine collection.   Thank you for the opportunity to be a part of the care of Ms. Fraga.  We will continue to follow her with ultrasounds in our [facility]. Please contact our office if we can be of further assistance.   I spent approximately 30 minutes with this patient with over 50% of time spent in face-to-face counseling.

## 2012-08-15 NOTE — Progress Notes (Signed)
Laritza RAVON MCILHENNY  was seen today for an ultrasound appointment.  See full report in [facility].  Single IUP at 19 2/7 weeks Normal detailed fetal anatomy; somewhat limited views of the fetal spine were obtained due to fetal position No markers associated with aneuploidy noted Normal amniotic fluid volume  Recommend follow-up ultrasound examination in 6 weeks to reevaluate the fetal spine / growth.  Alpha Gula, MD

## 2012-08-17 ENCOUNTER — Other Ambulatory Visit (HOSPITAL_COMMUNITY): Payer: Medicaid Other

## 2012-09-01 ENCOUNTER — Encounter: Payer: Self-pay | Admitting: Obstetrics

## 2012-09-06 ENCOUNTER — Encounter (HOSPITAL_COMMUNITY): Payer: Medicaid Other

## 2012-09-20 ENCOUNTER — Other Ambulatory Visit: Payer: Self-pay | Admitting: Obstetrics

## 2012-09-26 ENCOUNTER — Ambulatory Visit (HOSPITAL_COMMUNITY)
Admission: RE | Admit: 2012-09-26 | Discharge: 2012-09-26 | Disposition: A | Discharge status: Home / Self Care | Payer: Medicaid Other | Source: Ambulatory Visit | Attending: Obstetrics | Admitting: Obstetrics

## 2012-09-26 ENCOUNTER — Other Ambulatory Visit (HOSPITAL_COMMUNITY): Payer: Self-pay | Admitting: Maternal and Fetal Medicine

## 2012-09-26 VITALS — BP 132/70 | HR 93 | Wt 154.0 lb

## 2012-09-26 DIAGNOSIS — O24919 Unspecified diabetes mellitus in pregnancy, unspecified trimester: Secondary | ICD-10-CM | POA: Insufficient documentation

## 2012-09-26 DIAGNOSIS — IMO0001 Reserved for inherently not codable concepts without codable children: Secondary | ICD-10-CM

## 2012-09-26 DIAGNOSIS — O99330 Smoking (tobacco) complicating pregnancy, unspecified trimester: Secondary | ICD-10-CM | POA: Insufficient documentation

## 2012-09-26 DIAGNOSIS — O34219 Maternal care for unspecified type scar from previous cesarean delivery: Secondary | ICD-10-CM | POA: Insufficient documentation

## 2012-09-26 DIAGNOSIS — O09299 Supervision of pregnancy with other poor reproductive or obstetric history, unspecified trimester: Secondary | ICD-10-CM | POA: Insufficient documentation

## 2012-09-26 DIAGNOSIS — O10019 Pre-existing essential hypertension complicating pregnancy, unspecified trimester: Secondary | ICD-10-CM | POA: Insufficient documentation

## 2012-10-09 ENCOUNTER — Other Ambulatory Visit (HOSPITAL_COMMUNITY): Payer: Self-pay | Admitting: Obstetrics and Gynecology

## 2012-10-09 DIAGNOSIS — I10 Essential (primary) hypertension: Secondary | ICD-10-CM

## 2012-10-10 ENCOUNTER — Other Ambulatory Visit (HOSPITAL_COMMUNITY): Payer: Self-pay | Admitting: Obstetrics and Gynecology

## 2012-10-10 ENCOUNTER — Ambulatory Visit (HOSPITAL_COMMUNITY)
Admission: RE | Admit: 2012-10-10 | Discharge: 2012-10-10 | Disposition: A | Discharge status: Home / Self Care | Payer: Medicaid Other | Source: Ambulatory Visit | Attending: Obstetrics | Admitting: Obstetrics

## 2012-10-10 DIAGNOSIS — I10 Essential (primary) hypertension: Secondary | ICD-10-CM

## 2012-10-10 DIAGNOSIS — O34219 Maternal care for unspecified type scar from previous cesarean delivery: Secondary | ICD-10-CM | POA: Insufficient documentation

## 2012-10-10 DIAGNOSIS — O09299 Supervision of pregnancy with other poor reproductive or obstetric history, unspecified trimester: Secondary | ICD-10-CM | POA: Insufficient documentation

## 2012-10-10 DIAGNOSIS — O10019 Pre-existing essential hypertension complicating pregnancy, unspecified trimester: Secondary | ICD-10-CM | POA: Insufficient documentation

## 2012-10-10 DIAGNOSIS — O24919 Unspecified diabetes mellitus in pregnancy, unspecified trimester: Secondary | ICD-10-CM | POA: Insufficient documentation

## 2012-10-10 DIAGNOSIS — O99330 Smoking (tobacco) complicating pregnancy, unspecified trimester: Secondary | ICD-10-CM | POA: Insufficient documentation

## 2012-10-10 NOTE — Progress Notes (Signed)
[facility]  Indication: 32 yr old G3P2001 at [redacted]w[redacted]d with chronic hypertension, type II diabetes, and history of fetal demise and previous finding of lagging abdominal circumference for follow up ultrasound.  Findings: 1. Single intrauterine pregnancy. 2. Estimated fetal weight is in the 26th%. The abdominal circumference is in the 7th%. 3. Anterior placenta without evidence of previa. 4. Normal amniotic fluid volume. 5. Normal transabdominal cervical length. 6. The limited anatomy survey is normal. 7. Normal biophysical profile of 8/8. 8. Normal umbilical artery Doppler studies; systolic/diastolic ratio is at the top end of normal.  Recommendations: 1. Lagging abdominal circumference: - previously counseled - appropriate interval fetal growth - reassuring BPP and Doppler studies - recommend follow up ultrasound for fetal growth in 3 weeks 2. Chronic hypertension: - well controlled off of medication - recommend fetal growth as above - recommend start antenatal testing at [redacted] weeks gestation - recommend delivery by estimated due date but not prior to 38 weeks in the absence of other complications - recommend close monitoring of blood pressures and surveillance for the development of signs/symptoms of preeclampsia 3. Diabetes: - recommend obtain hemoglobin A1C - stressed importance of checking blood sugars 4x/day - recommend fetal growth and antenatal testing as above - normal fetal echocardiogram 4. Previous fetal demise: - previously counseled - recommend fetal growth and antenatal testing as above - do not recommend delivery prior to 38 weeks in the absence of other complications; if patient has anxiety consider option of delivery at 37 weeks with an amniocentesis for fetal lung maturity - recommend start fetal kick counts 5. Follow up in 3 weeks  Eulis Foster, MD

## 2012-10-27 ENCOUNTER — Other Ambulatory Visit (HOSPITAL_COMMUNITY): Payer: Self-pay | Admitting: Obstetrics

## 2012-10-27 DIAGNOSIS — O24319 Unspecified pre-existing diabetes mellitus in pregnancy, unspecified trimester: Secondary | ICD-10-CM

## 2012-10-27 DIAGNOSIS — F17210 Nicotine dependence, cigarettes, uncomplicated: Secondary | ICD-10-CM

## 2012-10-31 ENCOUNTER — Encounter (HOSPITAL_COMMUNITY): Payer: Self-pay

## 2012-10-31 ENCOUNTER — Ambulatory Visit (HOSPITAL_COMMUNITY)
Admission: RE | Admit: 2012-10-31 | Discharge: 2012-10-31 | Disposition: A | Discharge status: Home / Self Care | Payer: Medicaid Other | Source: Ambulatory Visit | Attending: Obstetrics | Admitting: Obstetrics

## 2012-10-31 DIAGNOSIS — O34219 Maternal care for unspecified type scar from previous cesarean delivery: Secondary | ICD-10-CM | POA: Insufficient documentation

## 2012-10-31 DIAGNOSIS — O24319 Unspecified pre-existing diabetes mellitus in pregnancy, unspecified trimester: Secondary | ICD-10-CM

## 2012-10-31 DIAGNOSIS — O09299 Supervision of pregnancy with other poor reproductive or obstetric history, unspecified trimester: Secondary | ICD-10-CM | POA: Insufficient documentation

## 2012-10-31 DIAGNOSIS — F17210 Nicotine dependence, cigarettes, uncomplicated: Secondary | ICD-10-CM

## 2012-10-31 DIAGNOSIS — O24919 Unspecified diabetes mellitus in pregnancy, unspecified trimester: Secondary | ICD-10-CM | POA: Insufficient documentation

## 2012-10-31 DIAGNOSIS — O10019 Pre-existing essential hypertension complicating pregnancy, unspecified trimester: Secondary | ICD-10-CM | POA: Insufficient documentation

## 2012-10-31 DIAGNOSIS — O99330 Smoking (tobacco) complicating pregnancy, unspecified trimester: Secondary | ICD-10-CM | POA: Insufficient documentation

## 2012-11-09 ENCOUNTER — Other Ambulatory Visit (HOSPITAL_COMMUNITY): Payer: Self-pay | Admitting: Obstetrics

## 2012-11-09 DIAGNOSIS — O161 Unspecified maternal hypertension, first trimester: Secondary | ICD-10-CM

## 2012-11-09 DIAGNOSIS — O24319 Unspecified pre-existing diabetes mellitus in pregnancy, unspecified trimester: Secondary | ICD-10-CM

## 2012-11-10 ENCOUNTER — Ambulatory Visit (HOSPITAL_COMMUNITY): Payer: Medicaid Other | Attending: Obstetrics

## 2012-11-10 ENCOUNTER — Other Ambulatory Visit (HOSPITAL_COMMUNITY): Payer: Medicaid Other

## 2012-11-13 ENCOUNTER — Other Ambulatory Visit: Payer: Self-pay | Admitting: Obstetrics

## 2012-11-17 ENCOUNTER — Ambulatory Visit (HOSPITAL_COMMUNITY)
Admission: RE | Admit: 2012-11-17 | Discharge: 2012-11-17 | Disposition: A | Discharge status: Home / Self Care | Payer: Medicaid Other | Source: Ambulatory Visit | Attending: Obstetrics | Admitting: Obstetrics

## 2012-11-17 ENCOUNTER — Encounter (HOSPITAL_COMMUNITY): Payer: Self-pay

## 2012-11-17 DIAGNOSIS — O09299 Supervision of pregnancy with other poor reproductive or obstetric history, unspecified trimester: Secondary | ICD-10-CM | POA: Insufficient documentation

## 2012-11-17 DIAGNOSIS — O10019 Pre-existing essential hypertension complicating pregnancy, unspecified trimester: Secondary | ICD-10-CM | POA: Insufficient documentation

## 2012-11-17 DIAGNOSIS — O161 Unspecified maternal hypertension, first trimester: Secondary | ICD-10-CM

## 2012-11-17 DIAGNOSIS — O99330 Smoking (tobacco) complicating pregnancy, unspecified trimester: Secondary | ICD-10-CM | POA: Insufficient documentation

## 2012-11-17 DIAGNOSIS — O34219 Maternal care for unspecified type scar from previous cesarean delivery: Secondary | ICD-10-CM | POA: Insufficient documentation

## 2012-11-17 DIAGNOSIS — O24319 Unspecified pre-existing diabetes mellitus in pregnancy, unspecified trimester: Secondary | ICD-10-CM

## 2012-11-21 ENCOUNTER — Ambulatory Visit (HOSPITAL_COMMUNITY): Payer: Medicaid Other

## 2012-11-22 ENCOUNTER — Other Ambulatory Visit (HOSPITAL_COMMUNITY): Payer: Self-pay | Admitting: Obstetrics

## 2012-11-22 DIAGNOSIS — O169 Unspecified maternal hypertension, unspecified trimester: Secondary | ICD-10-CM

## 2012-11-24 ENCOUNTER — Encounter (HOSPITAL_COMMUNITY): Payer: Self-pay

## 2012-11-24 ENCOUNTER — Ambulatory Visit (HOSPITAL_COMMUNITY)
Admission: RE | Admit: 2012-11-24 | Discharge: 2012-11-24 | Disposition: A | Discharge status: Home / Self Care | Payer: Medicaid Other | Source: Ambulatory Visit | Attending: Obstetrics | Admitting: Obstetrics

## 2012-11-24 DIAGNOSIS — O99330 Smoking (tobacco) complicating pregnancy, unspecified trimester: Secondary | ICD-10-CM | POA: Insufficient documentation

## 2012-11-24 DIAGNOSIS — O09299 Supervision of pregnancy with other poor reproductive or obstetric history, unspecified trimester: Secondary | ICD-10-CM | POA: Insufficient documentation

## 2012-11-24 DIAGNOSIS — O34219 Maternal care for unspecified type scar from previous cesarean delivery: Secondary | ICD-10-CM | POA: Insufficient documentation

## 2012-11-24 DIAGNOSIS — O169 Unspecified maternal hypertension, unspecified trimester: Secondary | ICD-10-CM

## 2012-11-24 DIAGNOSIS — O10019 Pre-existing essential hypertension complicating pregnancy, unspecified trimester: Secondary | ICD-10-CM | POA: Insufficient documentation

## 2012-11-24 NOTE — Progress Notes (Signed)
Terri Bradford  was seen today for an ultrasound appointment.  See full report in [facility].  Comments: Ms. Badgett returns for follow up antepartum fetal testing due to chronic hypertension, type II diabetes and prior term IUFD  Impression: Single IUP at 34 1/7 weeks Limited ultrasound for amniotic fluid volume only Mild polyhydramnios (AFI=25.6 cm) Reactive NST  BP today : 153/99  Recommendations: Recommend follow up growth scan next week. Continue 2x weekly NSTs with weekly AFIs. Consider delivery at 37-38 weeks.  Alpha Gula, MD

## 2012-11-27 ENCOUNTER — Other Ambulatory Visit (HOSPITAL_COMMUNITY): Payer: Self-pay | Admitting: Obstetrics

## 2012-11-27 DIAGNOSIS — O10919 Unspecified pre-existing hypertension complicating pregnancy, unspecified trimester: Secondary | ICD-10-CM

## 2012-11-27 LAB — OB RESULTS CONSOLE GC/CHLAMYDIA
Chlamydia: NEGATIVE
Gonorrhea: NEGATIVE

## 2012-11-28 ENCOUNTER — Ambulatory Visit (HOSPITAL_COMMUNITY)
Admission: RE | Admit: 2012-11-28 | Discharge: 2012-11-28 | Disposition: A | Discharge status: Home / Self Care | Payer: Medicaid Other | Source: Ambulatory Visit | Attending: Obstetrics | Admitting: Obstetrics

## 2012-11-28 ENCOUNTER — Encounter (HOSPITAL_COMMUNITY): Payer: Self-pay

## 2012-11-28 VITALS — BP 141/91 | HR 98 | Wt 162.0 lb

## 2012-11-28 DIAGNOSIS — O09299 Supervision of pregnancy with other poor reproductive or obstetric history, unspecified trimester: Secondary | ICD-10-CM | POA: Insufficient documentation

## 2012-11-28 DIAGNOSIS — O10019 Pre-existing essential hypertension complicating pregnancy, unspecified trimester: Secondary | ICD-10-CM | POA: Insufficient documentation

## 2012-11-28 DIAGNOSIS — O34219 Maternal care for unspecified type scar from previous cesarean delivery: Secondary | ICD-10-CM | POA: Insufficient documentation

## 2012-11-28 DIAGNOSIS — O10919 Unspecified pre-existing hypertension complicating pregnancy, unspecified trimester: Secondary | ICD-10-CM

## 2012-11-28 DIAGNOSIS — O99330 Smoking (tobacco) complicating pregnancy, unspecified trimester: Secondary | ICD-10-CM | POA: Insufficient documentation

## 2012-11-28 LAB — OB RESULTS CONSOLE GBS: GBS: NEGATIVE

## 2012-11-28 NOTE — Progress Notes (Signed)
[facility]  Indication: 32 yr old G3P2001 at [redacted]w[redacted]d with chronic hypertension, type II diabetes, and history of fetal demise and previous finding of lagging abdominal circumference for follow up ultrasound.  Findings: 1. Single intrauterine pregnancy. 2. Estimated fetal weight is in the 23rd%. The abdominal circumference is in the 10th%. The long bones measure 3-5 weeks behind. The appearance of the bones is normal and calcification is appropriate. 3. Anterior placenta without evidence of previa. 4. Normal amniotic fluid volume. 5. The limited anatomy survey is normal.  Recommendations: 1. Lagging abdominal circumference: - previously counseled - appropriate interval fetal growth - recommend follow up ultrasound for fetal growth in 3 weeks 2. Chronic hypertension: - well controlled off of medication - recommend fetal growth as above - continue antenatal testing at [redacted] weeks gestation - recommend delivery by estimated due date but not prior to 38 weeks in the absence of other complications - recommend close monitoring of blood pressures and surveillance for the development of signs/symptoms of preeclampsia 3. Diabetes: - recommend obtain hemoglobin A1C - stressed importance of checking blood sugars 4x/day - recommend fetal growth and antenatal testing as above - normal fetal echocardiogram 4. Previous fetal demise: - previously counseled - recommend fetal growth and antenatal testing as above - do not recommend delivery prior to 38 weeks in the absence of other complications; if patient has anxiety consider option of delivery at 37 weeks with an amniocentesis for fetal lung maturity - recommend start fetal kick counts 5. Lagging long bones: - discussed that at the anatomy survey the long bones were 1 week behind; at 27 weeks long bones were 1-2 weeks behind; and at 30 weeks long bones were 3 weeks behind - discussed now some long bones (femur) are 5 weeks  behind - discussed this may be constitutional or may be as a result of fetal aneuploidy or skeletal dysplasia - patient did not have aneuploidy screening - discussed not likely to be a lethal skeletal dysplasia if this is a skeletal dysplasia as the appearance of the bones is normal and bones are not severely lagging in size; the fetal chest size appears normal - discussed very difficult to determine prior to delivery - recommend fetal growth in 3 weeks - recommend inform Pediatrics at delivery and possible genetic referral after delivery if appropriate 6. Follow up in 3 weeks   Eulis Foster, MD

## 2012-12-01 ENCOUNTER — Ambulatory Visit (HOSPITAL_COMMUNITY): Payer: Medicaid Other | Attending: Obstetrics

## 2012-12-05 ENCOUNTER — Ambulatory Visit (HOSPITAL_COMMUNITY): Payer: Medicaid Other

## 2012-12-05 ENCOUNTER — Ambulatory Visit (HOSPITAL_COMMUNITY): Payer: Medicaid Other | Attending: Obstetrics

## 2012-12-07 ENCOUNTER — Other Ambulatory Visit (HOSPITAL_COMMUNITY): Payer: Self-pay | Admitting: Obstetrics and Gynecology

## 2012-12-07 DIAGNOSIS — O10919 Unspecified pre-existing hypertension complicating pregnancy, unspecified trimester: Secondary | ICD-10-CM

## 2012-12-12 ENCOUNTER — Ambulatory Visit (HOSPITAL_COMMUNITY): Payer: Medicaid Other | Attending: Obstetrics

## 2012-12-12 ENCOUNTER — Ambulatory Visit (HOSPITAL_COMMUNITY): Admission: RE | Admit: 2012-12-12 | Payer: Medicaid Other | Source: Ambulatory Visit

## 2012-12-13 ENCOUNTER — Ambulatory Visit (HOSPITAL_COMMUNITY): Payer: Medicaid Other | Attending: Obstetrics

## 2012-12-13 ENCOUNTER — Other Ambulatory Visit (HOSPITAL_COMMUNITY): Payer: Self-pay | Admitting: Obstetrics and Gynecology

## 2012-12-13 ENCOUNTER — Ambulatory Visit (HOSPITAL_COMMUNITY): Payer: Medicaid Other

## 2012-12-13 DIAGNOSIS — O10919 Unspecified pre-existing hypertension complicating pregnancy, unspecified trimester: Secondary | ICD-10-CM

## 2012-12-15 ENCOUNTER — Ambulatory Visit (HOSPITAL_COMMUNITY)
Admission: RE | Admit: 2012-12-15 | Discharge: 2012-12-15 | Disposition: A | Discharge status: Home / Self Care | Payer: Medicaid Other | Source: Ambulatory Visit | Attending: Obstetrics | Admitting: Obstetrics

## 2012-12-15 ENCOUNTER — Encounter (HOSPITAL_COMMUNITY): Payer: Self-pay

## 2012-12-15 DIAGNOSIS — O10919 Unspecified pre-existing hypertension complicating pregnancy, unspecified trimester: Secondary | ICD-10-CM

## 2012-12-15 DIAGNOSIS — O09299 Supervision of pregnancy with other poor reproductive or obstetric history, unspecified trimester: Secondary | ICD-10-CM | POA: Insufficient documentation

## 2012-12-15 DIAGNOSIS — O34219 Maternal care for unspecified type scar from previous cesarean delivery: Secondary | ICD-10-CM | POA: Insufficient documentation

## 2012-12-15 DIAGNOSIS — O10019 Pre-existing essential hypertension complicating pregnancy, unspecified trimester: Secondary | ICD-10-CM | POA: Insufficient documentation

## 2012-12-15 DIAGNOSIS — O99330 Smoking (tobacco) complicating pregnancy, unspecified trimester: Secondary | ICD-10-CM | POA: Insufficient documentation

## 2012-12-15 NOTE — Progress Notes (Signed)
Terri Bradford  was seen today for an ultrasound appointment.  See full report in [facility].  Impression: Single IUP at 37 1/7 weeks Limited ultrasound performed for AFI only Normal amniotic fluid volume (18.8 cm) Reactive NST; normal modified BPP  Recommendations: Recommend twice weekly nonstress tests with weekly amniotic fluid volume assessment.  Follow up ultrasound scheduled next week for interval growth and evaluation of the long bones.  Alpha Gula, MD

## 2012-12-15 NOTE — Addendum Note (Signed)
Encounter addended by: Alessandra Bevels. Chase Picket, RN on: 12/15/2012  3:08 PM<BR>     Documentation filed: Flowsheet VN

## 2012-12-18 ENCOUNTER — Inpatient Hospital Stay (HOSPITAL_COMMUNITY)
Admission: AD | Admit: 2012-12-18 | Discharge: 2012-12-22 | DRG: 774 | Disposition: A | Discharge status: Home / Self Care | Payer: Medicaid Other | Source: Ambulatory Visit | Attending: Obstetrics | Admitting: Obstetrics

## 2012-12-18 ENCOUNTER — Encounter (HOSPITAL_COMMUNITY): Payer: Self-pay | Admitting: *Deleted

## 2012-12-18 DIAGNOSIS — D696 Thrombocytopenia, unspecified: Secondary | ICD-10-CM | POA: Diagnosis present

## 2012-12-18 DIAGNOSIS — O1002 Pre-existing essential hypertension complicating childbirth: Secondary | ICD-10-CM | POA: Diagnosis present

## 2012-12-18 DIAGNOSIS — O99334 Smoking (tobacco) complicating childbirth: Secondary | ICD-10-CM | POA: Diagnosis present

## 2012-12-18 DIAGNOSIS — O9912 Other diseases of the blood and blood-forming organs and certain disorders involving the immune mechanism complicating childbirth: Principal | ICD-10-CM | POA: Diagnosis present

## 2012-12-18 DIAGNOSIS — D689 Coagulation defect, unspecified: Principal | ICD-10-CM | POA: Diagnosis present

## 2012-12-18 HISTORY — DX: Thrombocytopenia, unspecified: D69.6

## 2012-12-18 LAB — CBC
HCT: 35 % — ABNORMAL LOW (ref 36.0–46.0)
Hemoglobin: 12.4 g/dL (ref 12.0–15.0)
MCH: 32.4 pg (ref 26.0–34.0)
MCHC: 35.4 g/dL (ref 30.0–36.0)
MCV: 91.4 fL (ref 78.0–100.0)
Platelets: 102 10*3/uL — ABNORMAL LOW (ref 150–400)
RBC: 3.83 MIL/uL — ABNORMAL LOW (ref 3.87–5.11)
RDW: 15.2 % (ref 11.5–15.5)
WBC: 8.2 10*3/uL (ref 4.0–10.5)

## 2012-12-18 LAB — COMPREHENSIVE METABOLIC PANEL
ALT: 8 U/L (ref 0–35)
AST: 15 U/L (ref 0–37)
Albumin: 2.7 g/dL — ABNORMAL LOW (ref 3.5–5.2)
Alkaline Phosphatase: 151 U/L — ABNORMAL HIGH (ref 39–117)
BUN: 4 mg/dL — ABNORMAL LOW (ref 6–23)
CO2: 22 mEq/L (ref 19–32)
Calcium: 8.7 mg/dL (ref 8.4–10.5)
Chloride: 104 mEq/L (ref 96–112)
Creatinine, Ser: 0.65 mg/dL (ref 0.50–1.10)
GFR calc Af Amer: >90 mL/min (ref >90)
GFR calc non Af Amer: >90 mL/min (ref >90)
Glucose, Bld: 90 mg/dL (ref 70–99)
Potassium: 3.2 mEq/L — ABNORMAL LOW (ref 3.5–5.1)
Sodium: 135 mEq/L (ref 135–145)
Total Bilirubin: 0.7 mg/dL (ref 0.3–1.2)
Total Protein: 6.3 g/dL (ref 6.0–8.3)

## 2012-12-18 LAB — ABO/RH: ABO/RH(D): B POS

## 2012-12-18 LAB — TYPE AND SCREEN
ABO/RH(D): B POS
Antibody Screen: NEGATIVE

## 2012-12-18 LAB — URIC ACID: Uric Acid, Serum: 4.1 mg/dL (ref 2.4–7.0)

## 2012-12-18 MED ORDER — PHENYLEPHRINE 40 MCG/ML (10ML) SYRINGE FOR IV PUSH (FOR BLOOD PRESSURE SUPPORT)
80.0000 ug | PREFILLED_SYRINGE | INTRAVENOUS | Status: DC | PRN
  Filled 2012-12-18: qty 5
  Filled 2012-12-18: qty 2

## 2012-12-18 MED ORDER — LACTATED RINGERS IV SOLN
INTRAVENOUS | Status: DC
  Administered 2012-12-18 – 2012-12-19 (×2): via INTRAVENOUS

## 2012-12-18 MED ORDER — BUTORPHANOL TARTRATE 1 MG/ML IJ SOLN
1.0000 mg | INTRAMUSCULAR | Status: DC | PRN
  Administered 2012-12-19 (×2): 1 mg via INTRAVENOUS
  Filled 2012-12-18 (×2): qty 1

## 2012-12-18 MED ORDER — LACTATED RINGERS IV SOLN: 500.0000 mL | INTRAVENOUS | Status: DC | PRN

## 2012-12-18 MED ORDER — LACTATED RINGERS IV SOLN: 500.0000 mL | Freq: Once | INTRAVENOUS | Status: DC

## 2012-12-18 MED ORDER — OXYCODONE-ACETAMINOPHEN 5-325 MG PO TABS
1.0000 | ORAL_TABLET | ORAL | Status: DC | PRN
  Filled 2012-12-18: qty 1

## 2012-12-18 MED ORDER — EPHEDRINE 5 MG/ML INJ
10.0000 mg | INTRAVENOUS | Status: DC | PRN
  Filled 2012-12-18: qty 4
  Filled 2012-12-18: qty 2

## 2012-12-18 MED ORDER — LIDOCAINE HCL (PF) 1 % IJ SOLN
30.0000 mL | INTRAMUSCULAR | Status: DC | PRN
  Administered 2012-12-20: 30 mL via SUBCUTANEOUS
  Filled 2012-12-18 (×2): qty 30

## 2012-12-18 MED ORDER — DIPHENHYDRAMINE HCL 50 MG/ML IJ SOLN: 12.5000 mg | INTRAMUSCULAR | Status: DC | PRN

## 2012-12-18 MED ORDER — ONDANSETRON HCL 4 MG/2ML IJ SOLN
4.0000 mg | Freq: Four times a day (QID) | INTRAMUSCULAR | Status: DC | PRN
  Administered 2012-12-19 (×2): 4 mg via INTRAVENOUS
  Filled 2012-12-18 (×2): qty 2

## 2012-12-18 MED ORDER — IBUPROFEN 600 MG PO TABS
600.0000 mg | ORAL_TABLET | Freq: Four times a day (QID) | ORAL | Status: DC | PRN
  Administered 2012-12-20: 600 mg via ORAL
  Filled 2012-12-18 (×6): qty 1

## 2012-12-18 MED ORDER — MAGNESIUM SULFATE BOLUS VIA INFUSION
4.0000 g | Freq: Once | INTRAVENOUS | Status: AC
  Administered 2012-12-18: 4 g via INTRAVENOUS
  Filled 2012-12-18: qty 500

## 2012-12-18 MED ORDER — TERBUTALINE SULFATE 1 MG/ML IJ SOLN: 0.2500 mg | Freq: Once | INTRAMUSCULAR | Status: AC | PRN

## 2012-12-18 MED ORDER — EPHEDRINE 5 MG/ML INJ
10.0000 mg | INTRAVENOUS | Status: DC | PRN
  Filled 2012-12-18: qty 2

## 2012-12-18 MED ORDER — MAGNESIUM SULFATE 40 G IN LACTATED RINGERS - SIMPLE
2.0000 g/h | INTRAVENOUS | Status: DC
  Administered 2012-12-18: 4 g/h via INTRAVENOUS
  Administered 2012-12-19: 2 g/h via INTRAVENOUS
  Filled 2012-12-18 (×2): qty 500

## 2012-12-18 MED ORDER — OXYTOCIN 40 UNITS IN LACTATED RINGERS INFUSION - SIMPLE MED
1.0000 m[IU]/min | INTRAVENOUS | Status: DC
  Administered 2012-12-18: 12 m[IU]/min via INTRAVENOUS
  Administered 2012-12-18: 2 m[IU]/min via INTRAVENOUS

## 2012-12-18 MED ORDER — FENTANYL 2.5 MCG/ML BUPIVACAINE 1/10 % EPIDURAL INFUSION (WH - ANES)
14.0000 mL/h | INTRAMUSCULAR | Status: DC | PRN
  Administered 2012-12-19 (×4): 14 mL/h via EPIDURAL
  Filled 2012-12-18 (×4): qty 125

## 2012-12-18 MED ORDER — PHENYLEPHRINE 40 MCG/ML (10ML) SYRINGE FOR IV PUSH (FOR BLOOD PRESSURE SUPPORT)
80.0000 ug | PREFILLED_SYRINGE | INTRAVENOUS | Status: DC | PRN
  Filled 2012-12-18: qty 2

## 2012-12-18 MED ORDER — OXYTOCIN BOLUS FROM INFUSION: 500.0000 mL | INTRAVENOUS | Status: DC

## 2012-12-18 MED ORDER — MAGNESIUM SULFATE 40 G IN LACTATED RINGERS - SIMPLE: 2.0000 g/h | INTRAVENOUS | Status: DC

## 2012-12-18 MED ORDER — OXYTOCIN 40 UNITS IN LACTATED RINGERS INFUSION - SIMPLE MED
62.5000 mL/h | INTRAVENOUS | Status: DC
  Administered 2012-12-20: 999 mL/h via INTRAVENOUS
  Filled 2012-12-18 (×2): qty 1000

## 2012-12-18 MED ORDER — MAGNESIUM SULFATE 40 MG/ML IJ SOLN: 4.0000 g | Freq: Once | INTRAMUSCULAR | Status: DC

## 2012-12-18 MED ORDER — CITRIC ACID-SODIUM CITRATE 334-500 MG/5ML PO SOLN: 30.0000 mL | ORAL | Status: DC | PRN

## 2012-12-18 MED ORDER — ACETAMINOPHEN 325 MG PO TABS: 650.0000 mg | ORAL_TABLET | ORAL | Status: DC | PRN

## 2012-12-18 MED ORDER — FLEET ENEMA 7-19 GM/118ML RE ENEM: 1.0000 | ENEMA | RECTAL | Status: DC | PRN

## 2012-12-18 NOTE — H&P (Signed)
This is Dr. Francoise Ceo dictating the history and physical on  Terri Bradford she is a 32 year old gravida 3 para 2001 who had a fetal demise at term and had a C-section for a possible abruption of the placenta she was first seen on 08/07/2012 at 18-20 weeks and at that time she had some mild thrombocytopenia her platelets were 124 showed a history of chronic hypertension not on any medication she was referred to MFM and from 22 weeks to the present time she is run tablet 2 or 3+ proteinuria blood pressures ranged from 116/72 130/90 today however on admission blood pressure was 150 of 103 her hemoglobin A1c was 4.9 she's been getting NSTs twice weekly and ultrasounds her couple weeks with MFM today in the office showed 3+ proteinuria in 1 blood pressure 1:30 of 109 correction 130/90 and a three-day history of a headache cervix 1 cm 70% vertex -3 MFM said that since she is now developed this persistent headache she should've brought and and delivery because of a mild preeclampsia she was admitted and started on magnesium sulfate 4 g loading and 2 g and I'll and also started on Pitocin to 2 today's platelet count has not come back at the present Past medical history chronic hypertension was on lisinopril prior to her pregnancy Past medical surgical history she had a C-section in the past Social history she's a smoker System review noncontributory Physical exam well-developed female in no distress HEENT negative Breasts negative Heart regular rhythm no murmurs no gallops Abdomen 36-38 weeks size Pelvic as described above Extremities negative and

## 2012-12-19 ENCOUNTER — Encounter (HOSPITAL_COMMUNITY): Payer: Self-pay | Admitting: Anesthesiology

## 2012-12-19 ENCOUNTER — Encounter (HOSPITAL_COMMUNITY): Payer: Self-pay | Admitting: *Deleted

## 2012-12-19 ENCOUNTER — Ambulatory Visit (HOSPITAL_COMMUNITY): Payer: Medicaid Other

## 2012-12-19 ENCOUNTER — Inpatient Hospital Stay (HOSPITAL_COMMUNITY): Payer: Medicaid Other | Admitting: Anesthesiology

## 2012-12-19 LAB — CBC
HCT: 37.3 % (ref 36.0–46.0)
Hemoglobin: 13.1 g/dL (ref 12.0–15.0)
MCH: 32 pg (ref 26.0–34.0)
MCHC: 35.1 g/dL (ref 30.0–36.0)
MCV: 91 fL (ref 78.0–100.0)
Platelets: 96 10*3/uL — ABNORMAL LOW (ref 150–400)
RBC: 4.1 MIL/uL (ref 3.87–5.11)
RDW: 15.3 % (ref 11.5–15.5)
WBC: 9.7 10*3/uL (ref 4.0–10.5)

## 2012-12-19 LAB — RPR: RPR Ser Ql: NONREACTIVE

## 2012-12-19 MED ORDER — LIDOCAINE HCL (PF) 1 % IJ SOLN
INTRAMUSCULAR | Status: DC | PRN
  Administered 2012-12-19 (×2): 5 mL

## 2012-12-19 NOTE — Progress Notes (Signed)
Patient ID: Terri Bradford, female   DOB: 06-09-1981, 32 y.o.   MRN: [id number] Cervix no 5 cm vertex 0 to plus one

## 2012-12-19 NOTE — Anesthesia Procedure Notes (Signed)
Epidural Patient location during procedure: OB Start time: 12/19/2012 4:43 AM  Staffing Anesthesiologist: Angus Seller., Harrell Gave. Performed by: anesthesiologist   Preanesthetic Checklist Completed: patient identified, site marked, surgical consent, pre-op evaluation, timeout performed, IV checked, risks and benefits discussed and monitors and equipment checked  Epidural Patient position: sitting Prep: site prepped and draped and DuraPrep Patient monitoring: continuous pulse ox and blood pressure Approach: midline Injection technique: LOR air and LOR saline  Needle:  Needle type: Tuohy  Needle gauge: 17 G Needle length: 9 cm and 9 Needle insertion depth: 6 cm Catheter type: closed end flexible Catheter size: 19 Gauge Catheter at skin depth: 12 cm Test dose: negative  Assessment Events: blood not aspirated, injection not painful, no injection resistance, negative IV test and no paresthesia  Additional Notes Patient identified.  Risk benefits discussed including failed block, incomplete pain control, headache, nerve damage, paralysis, blood pressure changes, nausea, vomiting, reactions to medication both toxic or allergic, and postpartum back pain.  Patient expressed understanding and wished to proceed.  All questions were answered.  Sterile technique used throughout procedure and epidural site dressed with sterile barrier dressing. No paresthesia or other complications noted.The patient did not experience any signs of intravascular injection such as tinnitus or metallic taste in mouth nor signs of intrathecal spread such as rapid motor block. Please see nursing notes for vital signs.

## 2012-12-19 NOTE — Progress Notes (Signed)
Patient ID: Terri Bradford, female   DOB: 1980/12/14, 32 y.o.   MRN: [id number]  highest  blood pressure 150/96 she is on magnesium sulfate 2 g per hour Cervix is 2 cm 70% vertex -2 and amniotomy was performed the fluids clear she is on 60 milliunits of Pitocin

## 2012-12-19 NOTE — Progress Notes (Signed)
Patient ID: Terri Bradford, female   DOB: 02-28-1981, 32 y.o.   MRN: [id number] Patient has no 4 cm dilated 90% with the vertex at 0 station contracting every 2-3 minutes on 26 milliunits of Pitocin and her blood pressures are normal her platelets this morning   98 down from 104 last night

## 2012-12-19 NOTE — Anesthesia Preprocedure Evaluation (Signed)
Anesthesia Evaluation  Patient identified by MRN, date of birth, ID band Patient awake    Reviewed: Allergy & Precautions, H&P , Patient's Chart, lab work & pertinent test results  Airway Mallampati: II TM Distance: >3 FB Neck ROM: full    Dental no notable dental hx.    Pulmonary neg pulmonary ROS,  breath sounds clear to auscultation  Pulmonary exam normal       Cardiovascular hypertension, negative cardio ROS  Rhythm:regular Rate:Normal     Neuro/Psych negative neurological ROS  negative psych ROS   GI/Hepatic negative GI ROS, Neg liver ROS,   Endo/Other  negative endocrine ROSdiabetes  Renal/GU negative Renal ROS     Musculoskeletal   Abdominal   Peds  Hematology negative hematology ROS (+)   Anesthesia Other Findings plts 98K  Reproductive/Obstetrics (+) Pregnancy                           Anesthesia Physical Anesthesia Plan  ASA: III  Anesthesia Plan: Epidural   Post-op Pain Management:    Induction:   Airway Management Planned:   Additional Equipment:   Intra-op Plan:   Post-operative Plan:   Informed Consent: I have reviewed the patients History and Physical, chart, labs and discussed the procedure including the risks, benefits and alternatives for the proposed anesthesia with the patient or authorized representative who has indicated his/her understanding and acceptance.     Plan Discussed with:   Anesthesia Plan Comments:         Anesthesia Quick Evaluation

## 2012-12-19 NOTE — Progress Notes (Signed)
Patient ID: Terri Bradford, female   DOB: 04-16-1981, 32 y.o.   MRN: [id number] Patient is 2 cm dilated 80% and she is on 20 milliunits of Pitocin an IUPC was inserted doing well

## 2012-12-20 ENCOUNTER — Encounter (HOSPITAL_COMMUNITY): Payer: Self-pay | Admitting: *Deleted

## 2012-12-20 LAB — CBC
HCT: 35.2 % — ABNORMAL LOW (ref 36.0–46.0)
HCT: 31.5 % — ABNORMAL LOW (ref 36.0–46.0)
Hemoglobin: 11.9 g/dL — ABNORMAL LOW (ref 12.0–15.0)
Hemoglobin: 10.8 g/dL — ABNORMAL LOW (ref 12.0–15.0)
MCH: 31.2 pg (ref 26.0–34.0)
MCH: 31.6 pg (ref 26.0–34.0)
MCHC: 33.8 g/dL (ref 30.0–36.0)
MCHC: 34.3 g/dL (ref 30.0–36.0)
MCV: 92.4 fL (ref 78.0–100.0)
MCV: 92.1 fL (ref 78.0–100.0)
Platelets: 102 10*3/uL — ABNORMAL LOW (ref 150–400)
Platelets: 93 10*3/uL — ABNORMAL LOW (ref 150–400)
RBC: 3.81 MIL/uL — ABNORMAL LOW (ref 3.87–5.11)
RBC: 3.42 MIL/uL — ABNORMAL LOW (ref 3.87–5.11)
RDW: 15.4 % (ref 11.5–15.5)
RDW: 15.3 % (ref 11.5–15.5)
WBC: 18.7 10*3/uL — ABNORMAL HIGH (ref 4.0–10.5)
WBC: 26.8 10*3/uL — ABNORMAL HIGH (ref 4.0–10.5)

## 2012-12-20 LAB — MRSA PCR SCREENING: MRSA by PCR: NEGATIVE

## 2012-12-20 MED ORDER — OXYCODONE-ACETAMINOPHEN 5-325 MG PO TABS
1.0000 | ORAL_TABLET | ORAL | Status: DC | PRN
  Administered 2012-12-21: 1 via ORAL

## 2012-12-20 MED ORDER — BENZOCAINE-MENTHOL 20-0.5 % EX AERO
1.0000 "application " | INHALATION_SPRAY | CUTANEOUS | Status: DC | PRN
  Filled 2012-12-20: qty 56

## 2012-12-20 MED ORDER — SIMETHICONE 80 MG PO CHEW: 80.0000 mg | CHEWABLE_TABLET | ORAL | Status: DC | PRN

## 2012-12-20 MED ORDER — IBUPROFEN 600 MG PO TABS
600.0000 mg | ORAL_TABLET | Freq: Four times a day (QID) | ORAL | Status: DC
  Administered 2012-12-20 – 2012-12-22 (×9): 600 mg via ORAL
  Filled 2012-12-20 (×5): qty 1

## 2012-12-20 MED ORDER — WITCH HAZEL-GLYCERIN EX PADS: 1.0000 "application " | MEDICATED_PAD | CUTANEOUS | Status: DC | PRN

## 2012-12-20 MED ORDER — ZOLPIDEM TARTRATE 5 MG PO TABS: 5.0000 mg | ORAL_TABLET | Freq: Every evening | ORAL | Status: DC | PRN

## 2012-12-20 MED ORDER — DIPHENHYDRAMINE HCL 25 MG PO CAPS: 25.0000 mg | ORAL_CAPSULE | Freq: Four times a day (QID) | ORAL | Status: DC | PRN

## 2012-12-20 MED ORDER — LACTATED RINGERS IV SOLN
INTRAVENOUS | Status: DC
  Administered 2012-12-20 (×3): via INTRAVENOUS

## 2012-12-20 MED ORDER — DIBUCAINE 1 % RE OINT: 1.0000 "application " | TOPICAL_OINTMENT | RECTAL | Status: DC | PRN

## 2012-12-20 MED ORDER — SENNOSIDES-DOCUSATE SODIUM 8.6-50 MG PO TABS
2.0000 | ORAL_TABLET | Freq: Every day | ORAL | Status: DC
  Administered 2012-12-20 – 2012-12-21 (×2): 2 via ORAL

## 2012-12-20 MED ORDER — ONDANSETRON HCL 4 MG/2ML IJ SOLN: 4.0000 mg | INTRAMUSCULAR | Status: DC | PRN

## 2012-12-20 MED ORDER — TETANUS-DIPHTH-ACELL PERTUSSIS 5-2.5-18.5 LF-MCG/0.5 IM SUSP
0.5000 mL | Freq: Once | INTRAMUSCULAR | Status: DC
  Filled 2012-12-20 (×2): qty 0.5

## 2012-12-20 MED ORDER — MAGNESIUM SULFATE 40 G IN LACTATED RINGERS - SIMPLE
2.0000 g/h | INTRAVENOUS | Status: DC
  Administered 2012-12-20: 2 g/h via INTRAVENOUS
  Filled 2012-12-20 (×2): qty 500

## 2012-12-20 MED ORDER — ONDANSETRON HCL 4 MG PO TABS: 4.0000 mg | ORAL_TABLET | ORAL | Status: DC | PRN

## 2012-12-20 MED ORDER — LANOLIN HYDROUS EX OINT: TOPICAL_OINTMENT | CUTANEOUS | Status: DC | PRN

## 2012-12-20 MED ORDER — PRENATAL MULTIVITAMIN CH
1.0000 | ORAL_TABLET | Freq: Every day | ORAL | Status: DC
  Administered 2012-12-20 – 2012-12-21 (×2): 1 via ORAL
  Filled 2012-12-20 (×2): qty 1

## 2012-12-20 MED ORDER — FERROUS SULFATE 325 (65 FE) MG PO TABS
325.0000 mg | ORAL_TABLET | Freq: Two times a day (BID) | ORAL | Status: DC
  Administered 2012-12-20 – 2012-12-22 (×5): 325 mg via ORAL
  Filled 2012-12-20 (×5): qty 1

## 2012-12-20 NOTE — Progress Notes (Signed)
Pt asked if the nurse had to go out with her when she wants to smoke. Pt advised of no smoking policy & offered a Nicotine patch - pt declined patch.

## 2012-12-20 NOTE — Progress Notes (Deleted)
Spoke with Traci @ Dr Silvestre Mesi office & gave her the dosage & timing of pts lovenox so that she could call in the prescription per Dr Billy Coast

## 2012-12-20 NOTE — Progress Notes (Signed)
Patient ID: Terri Bradford, female   DOB: Jan 27, 1981, 32 y.o.   MRN: [id number] Postpartum day 0 Vital signs 157/77 output good afebrile no complaints Fundus firm Lochia moderate Legs negative Continue magnesium sulfate for 24 hours

## 2012-12-21 MED ORDER — LABETALOL HCL 200 MG PO TABS
200.0000 mg | ORAL_TABLET | Freq: Three times a day (TID) | ORAL | Status: DC
  Administered 2012-12-21 – 2012-12-22 (×3): 200 mg via ORAL
  Filled 2012-12-21 (×4): qty 1

## 2012-12-21 NOTE — Progress Notes (Signed)
Elmarie Shiley, RN [facility] charge notified pt will be ready for transfer around 0930 - will call back with bed/nurse assignment.

## 2012-12-21 NOTE — Progress Notes (Signed)
Pt asking about "going out to smoke - reminded of no smoking policy, & the effects of smoking on her BP. Pt was offered a nicotine patch yesterday but refused it.

## 2012-12-21 NOTE — Progress Notes (Signed)
Patient ID: Terri Bradford, female   DOB: [birth year], 32 y.o.   MRN: [id number] Postpartum day one Vital signs normal Fundus firm Lochia moderate Vital signs normal EDC magnesium sulfate today and transfer   stable

## 2012-12-21 NOTE — Anesthesia Postprocedure Evaluation (Signed)
  Anesthesia Post-op Note  Patient: Terri Bradford  Procedure(s) Performed: * No procedures listed *  Patient Location: Mother/Baby  Anesthesia Type:Epidural  Level of Consciousness: awake, alert  and oriented  Airway and Oxygen Therapy: Patient Spontanous Breathing  Post-op Pain: mild  Post-op Assessment: Post-op Vital signs reviewed, Patient's Cardiovascular Status Stable, No headache, No backache, No residual numbness and No residual motor weakness  Post-op Vital Signs: Reviewed and stable  Complications: No apparent anesthesia complications

## 2012-12-21 NOTE — Progress Notes (Signed)
UR chart review completed.  

## 2012-12-22 NOTE — Progress Notes (Signed)
Patient ID: Terri Bradford, female   DOB: Mar 15, 1981, 32 y.o.   MRN: [id number] Postpartum day 2 BP 123 placenta 77  She is on labetalol 200 mg every 8 hours Good fundus firm the moderate legs negative discharge today on labetalol to see me in one week for blood pressure check

## 2012-12-22 NOTE — Discharge Summary (Signed)
Obstetric Discharge Summary Reason for Admission: induction of labor Prenatal Procedures: NST Intrapartum Procedures: spontaneous vaginal delivery Postpartum Procedures: none Complications-Operative and Postpartum: none Hemoglobin  Date Value Range Status  12/20/2012 10.8* 12.0 - 15.0 g/dL Final     HCT  Date Value Range Status  12/20/2012 31.5* 36.0 - 46.0 % Final    Physical Exam:  General: alert Lochia: appropriate Uterine Fundus: firm Incision: healing well DVT Evaluation: No evidence of DVT seen on physical exam.  Discharge Diagnoses: Term Pregnancy-delivered  Discharge Information: Date: 12/22/2012 Activity: pelvic rest Diet: routine Medications: Percocet Condition: stable Instructions: refer to practice specific booklet Discharge to: home Follow-up Information   Schedule an appointment as soon as possible for a visit with Kathreen Cosier, MD.   Contact information:   [address] [place] [address] [phone]       Newborn Data: Live born female  Birth Weight: 5 lb 15 oz (2693 g) APGAR: 8, 9  Home with mother.  MARSHALL,BERNARD A 12/22/2012, 6:56 AM

## 2012-12-22 NOTE — Plan of Care (Signed)
Problem: Discharge Progression Outcomes Goal: MMR given as ordered Outcome: Not Met (add Reason) Pt declines

## 2013-01-03 ENCOUNTER — Inpatient Hospital Stay (HOSPITAL_COMMUNITY)
Admission: AD | Admit: 2013-01-03 | Discharge: 2013-01-06 | DRG: 776 | Disposition: A | Discharge status: Home / Self Care | Payer: Medicaid Other | Source: Ambulatory Visit | Attending: Obstetrics | Admitting: Obstetrics

## 2013-01-03 ENCOUNTER — Encounter (HOSPITAL_COMMUNITY): Payer: Self-pay | Admitting: *Deleted

## 2013-01-03 DIAGNOSIS — O139 Gestational [pregnancy-induced] hypertension without significant proteinuria, unspecified trimester: Secondary | ICD-10-CM | POA: Diagnosis present

## 2013-01-03 DIAGNOSIS — I10 Essential (primary) hypertension: Secondary | ICD-10-CM | POA: Diagnosis present

## 2013-01-03 DIAGNOSIS — O1495 Unspecified pre-eclampsia, complicating the puerperium: Secondary | ICD-10-CM

## 2013-01-03 DIAGNOSIS — IMO0001 Reserved for inherently not codable concepts without codable children: Principal | ICD-10-CM | POA: Diagnosis present

## 2013-01-03 LAB — URINALYSIS, ROUTINE W REFLEX MICROSCOPIC
Bilirubin Urine: NEGATIVE
Glucose, UA: NEGATIVE mg/dL
Ketones, ur: 15 mg/dL — AB
Nitrite: NEGATIVE
Protein, ur: 30 mg/dL — AB
Specific Gravity, Urine: 1.025 (ref 1.005–1.030)
Urobilinogen, UA: 1 mg/dL (ref 0.0–1.0)
pH: 6 (ref 5.0–8.0)

## 2013-01-03 LAB — COMPREHENSIVE METABOLIC PANEL
ALT: 17 U/L (ref 0–35)
AST: 19 U/L (ref 0–37)
Albumin: 3 g/dL — ABNORMAL LOW (ref 3.5–5.2)
Alkaline Phosphatase: 104 U/L (ref 39–117)
BUN: 9 mg/dL (ref 6–23)
CO2: 25 mEq/L (ref 19–32)
Calcium: 9.1 mg/dL (ref 8.4–10.5)
Chloride: 108 mEq/L (ref 96–112)
Creatinine, Ser: 0.81 mg/dL (ref 0.50–1.10)
GFR calc Af Amer: >90 mL/min (ref >90)
GFR calc non Af Amer: >90 mL/min (ref >90)
Glucose, Bld: 90 mg/dL (ref 70–99)
Potassium: 3.7 mEq/L (ref 3.5–5.1)
Sodium: 141 mEq/L (ref 135–145)
Total Bilirubin: 0.3 mg/dL (ref 0.3–1.2)
Total Protein: 6.7 g/dL (ref 6.0–8.3)

## 2013-01-03 LAB — CBC
HCT: 34.7 % — ABNORMAL LOW (ref 36.0–46.0)
Hemoglobin: 11.7 g/dL — ABNORMAL LOW (ref 12.0–15.0)
MCH: 30.5 pg (ref 26.0–34.0)
MCHC: 33.7 g/dL (ref 30.0–36.0)
MCV: 90.4 fL (ref 78.0–100.0)
Platelets: 249 10*3/uL (ref 150–400)
RBC: 3.84 MIL/uL — ABNORMAL LOW (ref 3.87–5.11)
RDW: 13.9 % (ref 11.5–15.5)
WBC: 6.2 10*3/uL (ref 4.0–10.5)

## 2013-01-03 LAB — LACTATE DEHYDROGENASE: LDH: 262 U/L — ABNORMAL HIGH (ref 94–250)

## 2013-01-03 LAB — MRSA PCR SCREENING: MRSA by PCR: NEGATIVE

## 2013-01-03 LAB — URINE MICROSCOPIC-ADD ON

## 2013-01-03 LAB — URIC ACID: Uric Acid, Serum: 7.1 mg/dL — ABNORMAL HIGH (ref 2.4–7.0)

## 2013-01-03 MED ORDER — SODIUM CHLORIDE 0.9 % IJ SOLN
3.0000 mL | Freq: Two times a day (BID) | INTRAMUSCULAR | Status: DC
  Administered 2013-01-05 (×2): 3 mL via INTRAVENOUS

## 2013-01-03 MED ORDER — LACTATED RINGERS IV SOLN
INTRAVENOUS | Status: DC
  Administered 2013-01-03 – 2013-01-05 (×3): via INTRAVENOUS

## 2013-01-03 MED ORDER — CLONIDINE HCL 0.1 MG PO TABS
0.1000 mg | ORAL_TABLET | Freq: Two times a day (BID) | ORAL | Status: DC
  Administered 2013-01-03 – 2013-01-06 (×5): 0.1 mg via ORAL
  Filled 2013-01-03 (×6): qty 1

## 2013-01-03 MED ORDER — MAGNESIUM SULFATE BOLUS VIA INFUSION
4.0000 g | Freq: Once | INTRAVENOUS | Status: AC
  Administered 2013-01-03: 4 g via INTRAVENOUS
  Filled 2013-01-03: qty 500

## 2013-01-03 MED ORDER — SODIUM CHLORIDE 0.9 % IJ SOLN: 3.0000 mL | INTRAMUSCULAR | Status: DC | PRN

## 2013-01-03 MED ORDER — LABETALOL HCL 5 MG/ML IV SOLN
40.0000 mg | INTRAVENOUS | Status: DC | PRN
  Administered 2013-01-03 – 2013-01-05 (×5): 40 mg via INTRAVENOUS
  Filled 2013-01-03 (×5): qty 8

## 2013-01-03 MED ORDER — SODIUM CHLORIDE 0.9 % IV SOLN: 250.0000 mL | INTRAVENOUS | Status: DC | PRN

## 2013-01-03 MED ORDER — MAGNESIUM SULFATE 40 G IN LACTATED RINGERS - SIMPLE
2.0000 g/h | INTRAVENOUS | Status: DC
  Administered 2013-01-04: 2 g/h via INTRAVENOUS
  Filled 2013-01-03 (×2): qty 500

## 2013-01-03 MED ORDER — PRENATAL MULTIVITAMIN CH
1.0000 | ORAL_TABLET | Freq: Every day | ORAL | Status: DC
  Administered 2013-01-04 – 2013-01-05 (×2): 1 via ORAL
  Filled 2013-01-03 (×2): qty 1

## 2013-01-03 MED ORDER — ZOLPIDEM TARTRATE 5 MG PO TABS: 5.0000 mg | ORAL_TABLET | Freq: Every evening | ORAL | Status: DC | PRN

## 2013-01-03 MED ORDER — LABETALOL HCL 200 MG PO TABS
400.0000 mg | ORAL_TABLET | Freq: Three times a day (TID) | ORAL | Status: DC
  Administered 2013-01-03: 400 mg via ORAL
  Filled 2013-01-03 (×5): qty 2

## 2013-01-03 MED ORDER — CLONIDINE HCL 0.1 MG PO TABS
0.1000 mg | ORAL_TABLET | Freq: Once | ORAL | Status: AC
  Administered 2013-01-03: 0.1 mg via ORAL
  Filled 2013-01-03: qty 1

## 2013-01-03 NOTE — MAU Provider Note (Signed)
History     CSN: [id number]  Arrival date and time: 01/03/13 1655   None     Chief Complaint  Patient presents with  . Hypertension   HPI 32 y.o. G3P3002 at 2 weeks PP with elevated BP. Sent by RN after home BP check, BP 180s/100s. Denies headache, + scotoma x 2-3 days. Has had 2 doses of labetalol 200 mg today. H/O CHTN, induced at term for mild preeclampsia. Pt has been seeing Dr. Gaynell Face regularly for BP checks, prescribed clonidine in addition to labetalol last week, but did not pick up rx because she did not have the money.   Past Medical History  Diagnosis Date  . Hypertension   . Thrombocytopenia, unspecified     Thrombocytopenia    Past Surgical History  Procedure Laterality Date  . Cesarean section      History reviewed. No pertinent family history.  History  Substance Use Topics  . Smoking status: Current Every Day Smoker -- 0.50 packs/day    Types: Cigarettes  . Smokeless tobacco: Not on file  . Alcohol Use: Yes     Comment: 2 weeks    Allergies: No Known Allergies  Prescriptions prior to admission  Medication Sig Dispense Refill  . labetalol (NORMODYNE) 200 MG tablet Take 200 mg by mouth every 8 (eight) hours.        Review of Systems  Constitutional: Negative.   Eyes:       + scotoma  Respiratory: Negative.   Cardiovascular: Negative.   Gastrointestinal: Negative for nausea, vomiting, abdominal pain, diarrhea and constipation.  Genitourinary: Negative for dysuria, urgency, frequency, hematuria and flank pain.  Musculoskeletal: Negative.   Neurological: Negative.  Negative for headaches.  Psychiatric/Behavioral: Negative.    Physical Exam   Blood pressure 180/103, pulse 55, temperature 98.3 F (36.8 C), temperature source Oral, resp. rate 18, weight 136 lb (61.689 kg), not currently breastfeeding.  Physical Exam  Nursing note and vitals reviewed. Constitutional: She is oriented to person, place, and time. She appears well-developed and  well-nourished. No distress.  Cardiovascular: Normal rate.   Respiratory: Effort normal.  GI: Soft. There is no tenderness.  Musculoskeletal: Normal range of motion.  Neurological: She is alert and oriented to person, place, and time. She has normal reflexes.  Skin: Skin is warm and dry.  Psychiatric: She has a normal mood and affect.    MAU Course  Procedures Results for orders placed during the hospital encounter of 01/03/13 (from the past 24 hour(s))  URINALYSIS, ROUTINE W REFLEX MICROSCOPIC     Status: Abnormal   Collection Time    01/03/13  5:15 PM      Result Value Range   Color, Urine YELLOW  YELLOW   APPearance HAZY (*) CLEAR   Specific Gravity, Urine 1.025  1.005 - 1.030   pH 6.0  5.0 - 8.0   Glucose, UA NEGATIVE  NEGATIVE mg/dL   Hgb urine dipstick LARGE (*) NEGATIVE   Bilirubin Urine NEGATIVE  NEGATIVE   Ketones, ur 15 (*) NEGATIVE mg/dL   Protein, ur 30 (*) NEGATIVE mg/dL   Urobilinogen, UA 1.0  0.0 - 1.0 mg/dL   Nitrite NEGATIVE  NEGATIVE   Leukocytes, UA LARGE (*) NEGATIVE  URINE MICROSCOPIC-ADD ON     Status: Abnormal   Collection Time    01/03/13  5:15 PM      Result Value Range   Squamous Epithelial / LPF FEW (*) RARE   WBC, UA 21-50  <3 WBC/hpf  RBC / HPF 3-6  <3 RBC/hpf   Bacteria, UA FEW (*) RARE  CBC     Status: Abnormal   Collection Time    01/03/13  5:20 PM      Result Value Range   WBC 6.2  4.0 - 10.5 K/uL   RBC 3.84 (*) 3.87 - 5.11 MIL/uL   Hemoglobin 11.7 (*) 12.0 - 15.0 g/dL   HCT 16.1 (*) 09.6 - 04.5 %   MCV 90.4  78.0 - 100.0 fL   MCH 30.5  26.0 - 34.0 pg   MCHC 33.7  30.0 - 36.0 g/dL   RDW 40.9  81.1 - 91.4 %   Platelets 249  150 - 400 K/uL  COMPREHENSIVE METABOLIC PANEL     Status: Abnormal   Collection Time    01/03/13  5:20 PM      Result Value Range   Sodium 141  135 - 145 mEq/L   Potassium 3.7  3.5 - 5.1 mEq/L   Chloride 108  96 - 112 mEq/L   CO2 25  19 - 32 mEq/L   Glucose, Bld 90  70 - 99 mg/dL   BUN 9  6 - 23 mg/dL    Creatinine, Ser 7.82  0.50 - 1.10 mg/dL   Calcium 9.1  8.4 - 95.6 mg/dL   Total Protein 6.7  6.0 - 8.3 g/dL   Albumin 3.0 (*) 3.5 - 5.2 g/dL   AST 19  0 - 37 U/L   ALT 17  0 - 35 U/L   Alkaline Phosphatase 104  39 - 117 U/L   Total Bilirubin 0.3  0.3 - 1.2 mg/dL   GFR calc non Af Amer >90  >90 mL/min   GFR calc Af Amer >90  >90 mL/min  URIC ACID     Status: Abnormal   Collection Time    01/03/13  5:20 PM      Result Value Range   Uric Acid, Serum 7.1 (*) 2.4 - 7.0 mg/dL  LACTATE DEHYDROGENASE     Status: Abnormal   Collection Time    01/03/13  5:20 PM      Result Value Range   LDH 262 (*) 94 - 250 U/L    Clonidine 0.1 mg PO given in MAU per Dr. Gaynell Face  BP became more elevated after dose of clonidine: Patient Vitals for the past 24 hrs:  BP Temp Temp src Pulse Resp Weight  01/03/13 1915 180/103 mmHg 98.3 F (36.8 C) Oral 55 18 -  01/03/13 1900 201/116 mmHg - - 54 - -  01/03/13 1850 196/108 mmHg - - 67 - -  01/03/13 1818 189/111 mmHg - - 53 - -  01/03/13 1808 188/108 mmHg - - 53 - -  01/03/13 1745 179/103 mmHg - - 55 - -  01/03/13 1730 175/101 mmHg - - 59 - -  01/03/13 1719 166/107 mmHg - - 62 - -  01/03/13 1710 181/100 mmHg 98.1 F (36.7 C) Oral 57 18 136 lb (61.689 kg)   Admit for BP meds and magnesium per Dr. Gaynell Face  Assessment and Plan  32 y.o. O1H0865 at 2 weeks postpartum with severe range blood pressures Admit to AICU for magnesium sulfate 4 g loading/2 g/hr Labetalol 400 mg TID Clonidine 0.1 mg BID IV labetalol PRN for >160/105   Yeray Tomas 01/03/2013, 7:27 PM

## 2013-01-03 NOTE — MAU Note (Addendum)
SVD on 05/21. Home nurse called MD, pt was told to come in. 182/92. Denies HA or epigastric pain, no increase in swelling.  occ sees spots.  Induced because of blood pressure.

## 2013-01-04 LAB — URINE CULTURE
Colony Count: NO GROWTH
Culture: NO GROWTH

## 2013-01-04 NOTE — Progress Notes (Signed)
UR chart review completed.  

## 2013-01-04 NOTE — Care Management Note (Signed)
    Page 1 of 1   01/04/2013     10:05:45 AM   CARE MANAGEMENT NOTE 01/04/2013  Patient:  Terri Bradford, Terri Bradford   Account Number:  [id number]  Date Initiated:  01/04/2013  Documentation initiated by:  Roseanne Reno  Subjective/Objective Assessment:   Pregnancy Induced Hypertension     Action/Plan:   Medication assistance   Anticipated DC Date:  01/06/2013   Anticipated DC Plan:  HOME/SELF CARE      DC Planning Services  Medication Assistance      Status of service:  Completed, signed off  Comments:  01/04/13  915a  Upon reviewing this pt's chart and being notified by Child psychotherapist, the pt states that she is unable to afford her medications.  Pt does not qualify for hospital medication assistance as she has a payer source - Medicaid.  Spoke w/ pt's Nurse as pt was sleeping, called pt's pharmacy as listed in EPIC - [facility] [phone] - copay for her Medications would be $3.00.  Nurse is aware that we are unable to provide medication assistance.  Nurse stated that she would tell the pt once she woke up.  CM available to speak w/ pt at any time.  TJohnson, RNBSN   [phone]

## 2013-01-04 NOTE — Progress Notes (Signed)
CSW received consult due to inability to afford medications.  CSW passed consult on to Care Management department.

## 2013-01-04 NOTE — Progress Notes (Addendum)
Morning labetalol and clonidine held by morning nurse because of pt's drop in BP last night after a dose of clonidine and because pt normotensive during morning.    BPs have increased this afternoon with SBPs 140's-150's and DBP 80's-90's.  Pulse upper 50's to lower 60's.    Phoned Dr. Gaynell Face with above information.  He said to hold Labetalol for now, give dose of clonidine, and continue magnesium drip overnight.  He will reevaluate in the AM.

## 2013-01-04 NOTE — H&P (Signed)
This is Dr. Francoise Ceo dictating the history and physical on  Jazae Oshima she's a 32 year old gravida 3 para 3002 is a readmit she was delivered on 521 at 37 weeks followed by MFM she was induced because of PIH and she  Has a  history of previous stillborn at 74 weeks during this pregnancy   she had a normal vaginal delivery and also had PIH was discharged home on labetalol 200  Mg q 8 hours she was seen in the office a week ago and her blood pressures were elevated a prescription clonidine 0.1 twice a day was given   but the patient states now that she did not fill a prescription because she had no money she was admitted last night blood pressure of 200  Over  116 she was started on magnesium sulfate labetalol 400 mg every 8 hours clonidine 0.1 twice a day and this morning her blood pressures 127/78 she  Will be  seen by social service today Past medical history of PIH with stillborn in the past Past surgical history negative Social history negative System review noncontributory Physical exam well-developed female in no distress HEENT negative lungs clear to P&A Heart regular rhythm no murmurs no gallops Breasts negative Abdomen uterus 20 week size postpartum Pelvic negative

## 2013-01-04 NOTE — Progress Notes (Signed)
Patient ID: Terri Bradford, female   DOB: 12-08-1980, 32 y.o.   MRN: [id number] Blood pressures 1 for similar 78 this morning there has been a wide range of elevated blood pressures row tonight She will be kept on magnesium sulfate today and observed she has no complaints

## 2013-01-05 MED ORDER — LABETALOL HCL 200 MG PO TABS
400.0000 mg | ORAL_TABLET | Freq: Three times a day (TID) | ORAL | Status: DC
  Administered 2013-01-05 – 2013-01-06 (×4): 400 mg via ORAL
  Filled 2013-01-05 (×6): qty 2

## 2013-01-05 MED ORDER — HYDRALAZINE HCL 20 MG/ML IJ SOLN
10.0000 mg | Freq: Once | INTRAMUSCULAR | Status: AC
  Administered 2013-01-05: 10 mg via INTRAVENOUS
  Filled 2013-01-05: qty 1

## 2013-01-05 MED ORDER — ACETAMINOPHEN 325 MG PO TABS
650.0000 mg | ORAL_TABLET | ORAL | Status: DC | PRN
  Administered 2013-01-05 (×2): 650 mg via ORAL
  Filled 2013-01-05 (×3): qty 2

## 2013-01-05 NOTE — Progress Notes (Signed)
Patient ID: Terri Bradford, female   DOB: Mar 06, 1981, 32 y.o.   MRN: [id number] BP 148/93  will  discontinue magnesium sulfate this morning Continued on labetalol 400 every 8 hours and clonidine as ordered

## 2013-01-05 NOTE — Progress Notes (Signed)
Labetalol 40mg  IV given

## 2013-01-05 NOTE — Progress Notes (Signed)
Dr. Gaynell Face in to see pt. Magnesium d/c'd and po labetalol ordered

## 2013-01-05 NOTE — Progress Notes (Signed)
0000 - BP 164/102, checked at 0012 - 172/110, re-check 0016 - 166/107. Proceeded to give prn Labetalol 40mg  IV, pt. Tolerated well

## 2013-01-06 DIAGNOSIS — I10 Essential (primary) hypertension: Secondary | ICD-10-CM | POA: Diagnosis present

## 2013-01-06 DIAGNOSIS — O139 Gestational [pregnancy-induced] hypertension without significant proteinuria, unspecified trimester: Secondary | ICD-10-CM | POA: Diagnosis present

## 2013-01-06 MED ORDER — LABETALOL HCL 200 MG PO TABS: 400.0000 mg | ORAL_TABLET | Freq: Three times a day (TID) | ORAL | Status: DC

## 2013-01-06 MED ORDER — CLONIDINE HCL 0.1 MG PO TABS: 0.1000 mg | ORAL_TABLET | Freq: Two times a day (BID) | ORAL | Status: DC

## 2013-01-06 NOTE — Progress Notes (Signed)
Discharge instructions provided to patient and family at bedside.  Activity, merdications, follow up appintments, when to call the doctor and community resources.  No questions at this time.  Patient left unit in stable condition with all personal belongings accompanied by staff.  Prescriptions electronically sent to pharmacy, patient verbally confirmed proper pharmacy location.  Osvaldo Angst, RN--------------------

## 2013-01-06 NOTE — Discharge Summary (Signed)
  Physician Discharge Summary  Patient ID: Terri Bradford MRN: [id number] DOB/AGE: 1981/01/27 32 y.o.  Admit date: 01/03/2013 Discharge date: 01/06/2013  Admission Diagnoses: Active Problems:   Unspecified essential hypertension   PIH (pregnancy induced hypertension)  Discharge Diagnoses:  Active Problems:   Unspecified essential hypertension   PIH (pregnancy induced hypertension)   Discharged Condition: stable  Hospital Course: The patient received MgSO4 for seizure prophylaxis.  Her blood pressures were labile, requiring parenteral medications for severe blood pressure elevations.  She was started on oral Labetalol.  On the day of discharge her blood pressures were adequately controlled.  Consults: None  Significant Diagnostic Studies: labs: PIH  Treatments: see above  Discharge Exam: Blood pressure 157/89, pulse 59, temperature 98.5 F (36.9 C), temperature source Oral, resp. rate 16, height 5\' 1"  (1.549 m), weight 135 lb (61.236 kg), SpO2 100.00%, not currently breastfeeding. General appearance: alert GI: soft, non-tender; bowel sounds normal; no masses,  no organomegaly Extremities: extremities normal, atraumatic, no cyanosis or edema  Disposition: 01-Home or Self Care  Discharge Orders   Future Orders Complete By Expires     Bed rest  As directed     Call MD for:  extreme fatigue  As directed     Call MD for:  persistant dizziness or light-headedness  As directed     Call MD for:  persistant nausea and vomiting  As directed     Call MD for:  severe uncontrolled pain  As directed     Diet - low sodium heart healthy  As directed     May shower / Bathe  As directed     Comments:      No tub baths for 6 weeks    Sexual Activity Restrictions  As directed     Comments:      No intercourse for 6  weeks        Medication List    TAKE these medications       labetalol 200 MG tablet  Commonly known as:  NORMODYNE  Take 2 tablets (400 mg total) by mouth every 8  (eight) hours.           Follow-up Information   Follow up with MARSHALL,BERNARD A, MD. Schedule an appointment as soon as possible for a visit in 2 days.   Contact information:   [address] [place] [address] [phone]       Signed: Roseanna Rainbow 01/06/2013, 11:42 AM

## 2013-02-23 ENCOUNTER — Encounter (HOSPITAL_COMMUNITY): Payer: Self-pay | Admitting: Adult Health

## 2013-02-23 DIAGNOSIS — H539 Unspecified visual disturbance: Secondary | ICD-10-CM | POA: Insufficient documentation

## 2013-02-23 DIAGNOSIS — R42 Dizziness and giddiness: Secondary | ICD-10-CM | POA: Insufficient documentation

## 2013-02-23 DIAGNOSIS — E860 Dehydration: Secondary | ICD-10-CM | POA: Insufficient documentation

## 2013-02-23 DIAGNOSIS — F17200 Nicotine dependence, unspecified, uncomplicated: Secondary | ICD-10-CM | POA: Insufficient documentation

## 2013-02-23 DIAGNOSIS — R209 Unspecified disturbances of skin sensation: Secondary | ICD-10-CM | POA: Insufficient documentation

## 2013-02-23 DIAGNOSIS — R0602 Shortness of breath: Secondary | ICD-10-CM | POA: Insufficient documentation

## 2013-02-23 DIAGNOSIS — Z862 Personal history of diseases of the blood and blood-forming organs and certain disorders involving the immune mechanism: Secondary | ICD-10-CM | POA: Insufficient documentation

## 2013-02-23 DIAGNOSIS — Z79899 Other long term (current) drug therapy: Secondary | ICD-10-CM | POA: Insufficient documentation

## 2013-02-23 DIAGNOSIS — Z3202 Encounter for pregnancy test, result negative: Secondary | ICD-10-CM | POA: Insufficient documentation

## 2013-02-23 DIAGNOSIS — I10 Essential (primary) hypertension: Secondary | ICD-10-CM | POA: Insufficient documentation

## 2013-02-23 NOTE — ED Notes (Signed)
Presents with dizziness that began yesterday and went away and has returned. She reports taking her blood pressure medication at 10 am and then she began to feel dizzy again. Pt is hypertensive, alert and oriented. Reports blurred vision for 2 months. Denies headache. No neurological deficits. She reports tingling in bilateral feet that occurs all day long.

## 2013-02-24 ENCOUNTER — Emergency Department (HOSPITAL_COMMUNITY)
Admission: EM | Admit: 2013-02-24 | Discharge: 2013-02-24 | Disposition: A | Discharge status: Home / Self Care | Payer: Medicaid Other | Attending: Emergency Medicine | Admitting: Emergency Medicine

## 2013-02-24 ENCOUNTER — Emergency Department (HOSPITAL_COMMUNITY): Payer: Medicaid Other

## 2013-02-24 DIAGNOSIS — E860 Dehydration: Secondary | ICD-10-CM

## 2013-02-24 DIAGNOSIS — I10 Essential (primary) hypertension: Secondary | ICD-10-CM

## 2013-02-24 DIAGNOSIS — R42 Dizziness and giddiness: Secondary | ICD-10-CM

## 2013-02-24 LAB — COMPREHENSIVE METABOLIC PANEL
ALT: 15 U/L (ref 0–35)
AST: 19 U/L (ref 0–37)
Albumin: 3.6 g/dL (ref 3.5–5.2)
Alkaline Phosphatase: 87 U/L (ref 39–117)
BUN: 9 mg/dL (ref 6–23)
CO2: 27 mEq/L (ref 19–32)
Calcium: 9.5 mg/dL (ref 8.4–10.5)
Chloride: 102 mEq/L (ref 96–112)
Creatinine, Ser: 0.84 mg/dL (ref 0.50–1.10)
GFR calc Af Amer: >90 mL/min (ref >90)
GFR calc non Af Amer: 90 mL/min — ABNORMAL LOW (ref >90)
Glucose, Bld: 103 mg/dL — ABNORMAL HIGH (ref 70–99)
Potassium: 3.6 mEq/L (ref 3.5–5.1)
Sodium: 138 mEq/L (ref 135–145)
Total Bilirubin: 0.2 mg/dL — ABNORMAL LOW (ref 0.3–1.2)
Total Protein: 7 g/dL (ref 6.0–8.3)

## 2013-02-24 LAB — URINALYSIS, ROUTINE W REFLEX MICROSCOPIC
Bilirubin Urine: NEGATIVE
Glucose, UA: NEGATIVE mg/dL
Hgb urine dipstick: NEGATIVE
Ketones, ur: NEGATIVE mg/dL
Leukocytes, UA: NEGATIVE
Nitrite: NEGATIVE
Protein, ur: NEGATIVE mg/dL
Specific Gravity, Urine: 1.019 (ref 1.005–1.030)
Urobilinogen, UA: 1 mg/dL (ref 0.0–1.0)
pH: 6.5 (ref 5.0–8.0)

## 2013-02-24 LAB — CBC
HCT: 40.4 % (ref 36.0–46.0)
Hemoglobin: 13.5 g/dL (ref 12.0–15.0)
MCH: 29.4 pg (ref 26.0–34.0)
MCHC: 33.4 g/dL (ref 30.0–36.0)
MCV: 88 fL (ref 78.0–100.0)
Platelets: 130 10*3/uL — ABNORMAL LOW (ref 150–400)
RBC: 4.59 MIL/uL (ref 3.87–5.11)
RDW: 13.3 % (ref 11.5–15.5)
WBC: 6.8 10*3/uL (ref 4.0–10.5)

## 2013-02-24 LAB — TROPONIN I: Troponin I: <0.3 ng/mL (ref <0.30)

## 2013-02-24 LAB — POCT PREGNANCY, URINE: Preg Test, Ur: NEGATIVE

## 2013-02-24 MED ORDER — IPRATROPIUM BROMIDE 0.02 % IN SOLN
0.5000 mg | RESPIRATORY_TRACT | Status: DC
  Administered 2013-02-24: 0.5 mg via RESPIRATORY_TRACT
  Filled 2013-02-24 (×2): qty 2.5

## 2013-02-24 MED ORDER — ALBUTEROL SULFATE (5 MG/ML) 0.5% IN NEBU
2.5000 mg | INHALATION_SOLUTION | RESPIRATORY_TRACT | Status: DC
  Administered 2013-02-24: 2.5 mg via RESPIRATORY_TRACT
  Filled 2013-02-24 (×2): qty 0.5

## 2013-02-24 MED ORDER — AMLODIPINE BESYLATE 5 MG PO TABS: 5.0000 mg | ORAL_TABLET | Freq: Once | ORAL | Status: DC

## 2013-02-24 NOTE — ED Provider Notes (Signed)
CSN: [id number]     Arrival date & time 02/23/13  2118 History     First MD Initiated Contact with Patient 02/24/13 0005     Chief Complaint  Patient presents with  . Dizziness   (Consider location/radiation/quality/duration/timing/severity/associated sxs/prior Treatment) The history is provided by the patient and medical records. No language interpreter was used.    Terri Bradford is a 32 y.o. female  with a hx of HTN (on lisinopril), preeclampsia and eclampsia presents to the Emergency Department complaining of gradual, persistent, progressively worsening HTN with associated lightheaded beginning this morning.  Pt states she has had a lot of trouble controlling her BP.  She was last seen by her PCP (Evans-Blount)< 1 month ago and started on lisinopril. She reports that she has a followup appointment on August 1. Pt had borderline high BP prior to becoming pregnant last year.  Pt states her BP went way up during her pregnancy and has continued to be high.  Associated symptoms include tingling in her hands and feet for almost 1 year, blurred vision for 2 months, intermittent SOB.  Nothing makes it better and nothing makes it worse.  Pt denies fever, chills, headache, chest pain, weakness, dizziness, syncope, dysuria, hematuria.     Past Medical History  Diagnosis Date  . Hypertension   . Thrombocytopenia, unspecified     Thrombocytopenia   Past Surgical History  Procedure Laterality Date  . Cesarean section     History reviewed. No pertinent family history. History  Substance Use Topics  . Smoking status: Current Every Day Smoker -- 0.50 packs/day    Types: Cigarettes  . Smokeless tobacco: Not on file  . Alcohol Use: Yes     Comment: 2 weeks   OB History   Grav Para Term Preterm Abortions TAB SAB Ect Mult Living   3 3 3  0 0 0 0 0 0 2     Review of Systems  Constitutional: Negative for fever, diaphoresis, appetite change, fatigue and unexpected weight change.  HENT: Negative  for mouth sores and neck stiffness.   Eyes: Positive for visual disturbance.  Respiratory: Positive for shortness of breath. Negative for cough, chest tightness and wheezing.   Cardiovascular: Negative for chest pain.  Gastrointestinal: Negative for nausea, vomiting, abdominal pain, diarrhea and constipation.  Endocrine: Negative for polydipsia, polyphagia and polyuria.  Genitourinary: Negative for dysuria, urgency, frequency and hematuria.  Musculoskeletal: Negative for back pain.  Skin: Negative for rash.  Allergic/Immunologic: Negative for immunocompromised state.  Neurological: Positive for light-headedness. Negative for syncope and headaches.       Paresthesias of the hands and feet  Hematological: Does not bruise/bleed easily.  Psychiatric/Behavioral: Negative for sleep disturbance. The patient is not nervous/anxious.     Allergies  Review of patient's allergies indicates no known allergies.  Home Medications   Current Outpatient Rx  Name  Route  Sig  Dispense  Refill  . lisinopril-hydrochlorothiazide (PRINZIDE,ZESTORETIC) 20-12.5 MG per tablet   Oral   Take 1 tablet by mouth daily.          BP 169/105  Pulse 63  Temp(Src) 98.5 F (36.9 C) (Oral)  Resp 15  SpO2 100% Physical Exam  Nursing note and vitals reviewed. Constitutional: She is oriented to person, place, and time. She appears well-developed and well-nourished. No distress.  Awake, alert, nontoxic appearance  HENT:  Head: Normocephalic and atraumatic.  Mouth/Throat: Oropharynx is clear and moist. No oropharyngeal exudate.  Eyes: Conjunctivae and EOM are normal. Pupils  are equal, round, and reactive to light. No scleral icterus.  Neck: Normal range of motion. Neck supple.  Cardiovascular: Normal rate, regular rhythm, normal heart sounds and intact distal pulses.   No murmur heard. Pulmonary/Chest: Effort normal and breath sounds normal. No respiratory distress. She has no wheezes. She has no rales. She  exhibits no tenderness.  Abdominal: Soft. Bowel sounds are normal. She exhibits no distension and no mass. There is no tenderness. There is no rebound and no guarding.  Musculoskeletal: Normal range of motion. She exhibits no edema.  Lymphadenopathy:    She has no cervical adenopathy.  Neurological: She is alert and oriented to person, place, and time. She has normal reflexes. No cranial nerve deficit. She exhibits normal muscle tone. Coordination normal.  Speech is clear and goal oriented, follows commands Cranial nerves III - XII without deficit, no facial droop Normal strength in upper and lower extremities bilaterally, strong and equal grip strength Sensation normal to light and sharp touch Moves extremities without ataxia, coordination intact Normal finger to nose and rapid alternating movements Neg romberg, no pronator drift Normal gait Normal heel-shin and balance   Skin: Skin is warm and dry. No rash noted. She is not diaphoretic. No erythema.  Psychiatric: She has a normal mood and affect. Her behavior is normal. Judgment and thought content normal.    ED Course   Procedures (including critical care time)  Labs Reviewed  CBC - Abnormal; Notable for the following:    Platelets 130 (*)    All other components within normal limits  COMPREHENSIVE METABOLIC PANEL - Abnormal; Notable for the following:    Glucose, Bld 103 (*)    Total Bilirubin 0.2 (*)    GFR calc non Af Amer 90 (*)    All other components within normal limits  URINALYSIS, ROUTINE W REFLEX MICROSCOPIC  TROPONIN I  POCT PREGNANCY, URINE   Dg Chest 2 View  02/24/2013   *RADIOLOGY REPORT*  Clinical Data: Shortness of breath and weakness.  History of recent childbirth.  CHEST - 2 VIEW  Comparison: 06/02/2009  Findings: The heart size and pulmonary vascularity are normal. The lungs appear clear and expanded without focal air space disease or consolidation. No blunting of the costophrenic angles.  No pneumothorax.   Mediastinal contours appear intact.  No significant change since previous study.  IMPRESSION: No evidence of active pulmonary disease.   Original Report Authenticated By: Burman Nieves, M.D.   ECG:  Date: 02/24/2013  Rate: 56  Rhythm: sinus bradycardia  QRS Axis: normal  Intervals: normal  ST/T Wave abnormalities: normal  Conduction Disutrbances:none  Narrative Interpretation: Sinus rhythm, nonischemic ECG, unchanged from 07/13/2011  Old EKG Reviewed: unchanged    1. HTN (hypertension)   2. Dehydration   3. Lightheadedness     MDM  Loreley CARLEEN RHUE presents with hypertension and dizziness.  Test negative, UA without evidence of urinary tract infection, troponin negative, CBC and CMP unremarkable.  ECG nonischemic and chest x-ray without evidence of cardiomegaly or pulmonary edema. No evidence of hypertensive urgency or emergency.  Patient endorses alcohol intake and minimal water intake today with decreased urine; dizziness likely secondary to dehydration.  Neurologically intact, no nystagmus on exam.  PERC negative, VSS, no tracheal deviation, no JVD or new murmur, RRR, breath sounds equal bilaterally, EKG without acute abnormalities, negative troponin, and negative CXR. I personally reviewed the imaging tests through PACS system.  I reviewed available ER/hospitalization records through the EMR. Patient is to be discharged  with instructions to take medication as prescribed and recommendation to follow up with PCP in regards to today's hospital visit. I have also discussed reasons to return immediately to the ER.  Patient expresses understanding and agrees with plan.  Case has been discussed with and seen by Dr. Rulon Abide who agrees with the above plan to discharge.   Dahlia Client Jacson Rapaport, PA-C 02/24/13 0203  Dierdre Forth, PA-C 02/24/13 1610

## 2013-02-24 NOTE — ED Provider Notes (Signed)
Medical screening examination/treatment/procedure(s) were conducted as a shared visit with non-physician practitioner(s) and myself.  I personally evaluated the patient during the encounter Jones Skene, M.D.  Terri Bradford is a 32 y.o. female complaining about dizziness and high blood pressure. Patient is taking a combination lisinopril/hydrochlorothiazide, however she's only taking about half pill a time. She is only taking half the dose she takes it sporadically. Patient sees the [facility]. She denies any chest pain says she's had some mild shortness of breath-patient smokes a pack a day. She's also complaining about some dizziness, this occurred with this, it does occur on changing positions. Patient says she's only urinated twice today, and SO says she drinks significant amount of liquor, "whenever she can get it." Symptoms are mild, intermittent. No history of venous thromboembolic disease, no chest pain.   VITAL SIGNS:   Filed Vitals:   02/24/13 0229  BP: 150/100  Pulse: 54  Temp: 97.3 F (36.3 C)  Resp: 16   CONSTITUTIONAL: Awake, oriented, appears non-toxic HENT: Atraumatic, normocephalic, oral mucosa pink and moist, airway patent. Nares patent without drainage. External ears normal. EYES: Conjunctiva clear, EOMI, PERRLA NECK: Trachea midline, non-tender, supple CARDIOVASCULAR: Normal heart rate, Normal rhythm, No murmurs, rubs, gallops PULMONARY/CHEST: Good air movement bilaterally, wheezing bilaterally Symmetrical breath sounds. Non-tender. ABDOMINAL: Non-distended, soft, non-tender - no rebound or guarding.  BS normal. NEUROLOGIC: Non-focal, moving all four extremities, no gross sensory or motor deficits. EXTREMITIES: No clubbing, cyanosis, or edema SKIN: Warm, Dry, No erythema, No rash  MDM: PERC negative, patient presents with multiple complaints including hypertension, dizziness on changing position, and some mild shortness of breath. Patient is a  smoker, she is some mild wheezing, do not think she's got a pneumonia, is likely secondary to some mild bronchitis versus early COPD.  Patient's dizziness seems to be a changing position, her she's probably mildly dehydrated, vital signs are stable and within normal limits with the exception of elevated blood pressure-I. do not think she needs intravenous fluids, she can rehydrate orally. She has no nausea vomiting or diarrhea, no abdominal pain either. Patient is counseled to quit smoking, and alcohol only in moderation, also encouraged to take medication 1 pill daily as prescribed. She has a followup appointment at her clinic, at that point on August 1 and check her blood pressures again. We'll not start a third medication. Patient stable for discharge.   Jones Skene, MD 02/24/13 [phone]

## 2013-05-11 IMAGING — US US OB LIMITED
1 series · 13 of 22 positions shown · non-contrast
Comparison: none

[Series 1: us ob limited · 0.23mm/px · 22 acquisitions, 13 frames shown]
[im 1/22]
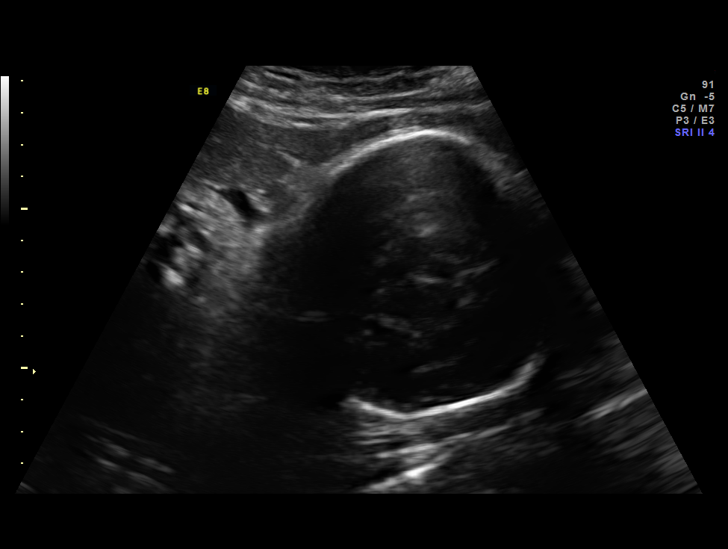
[im 3/22]
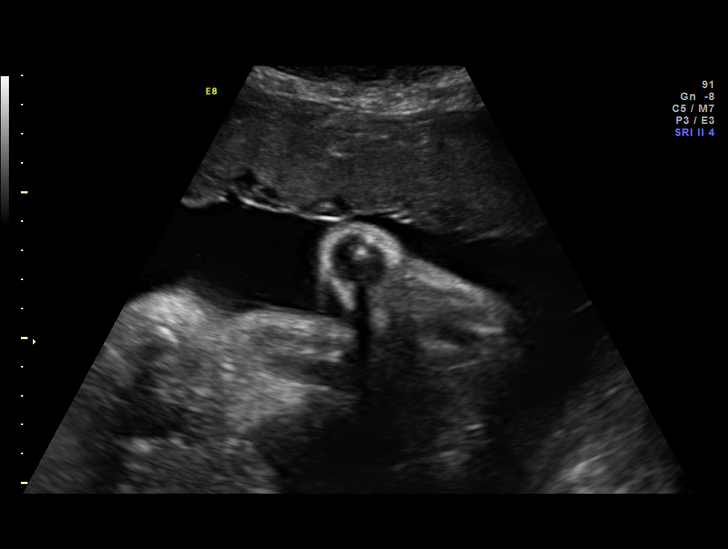
[im 5/22]
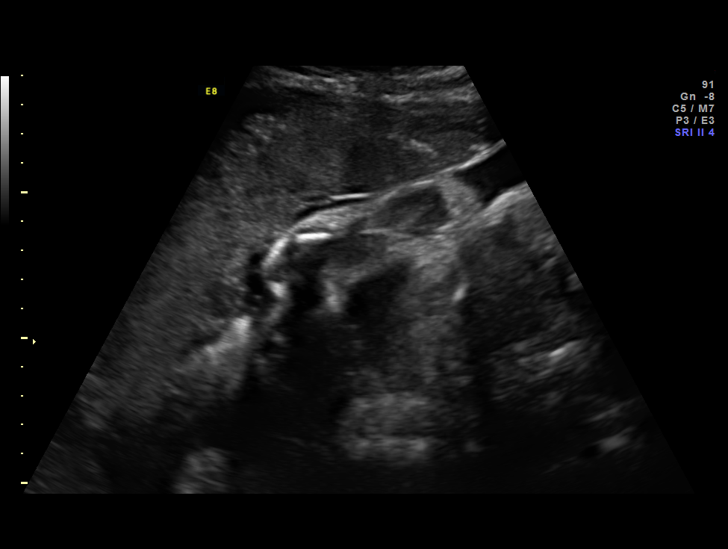
[im 6/22]
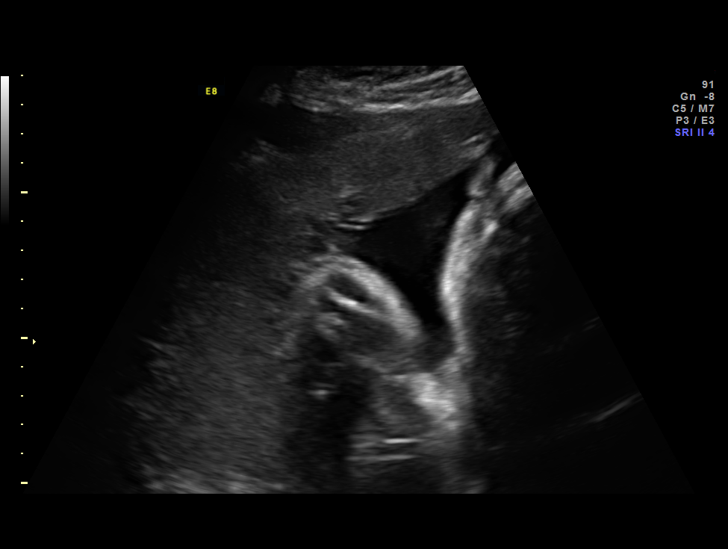
[im 8/22]
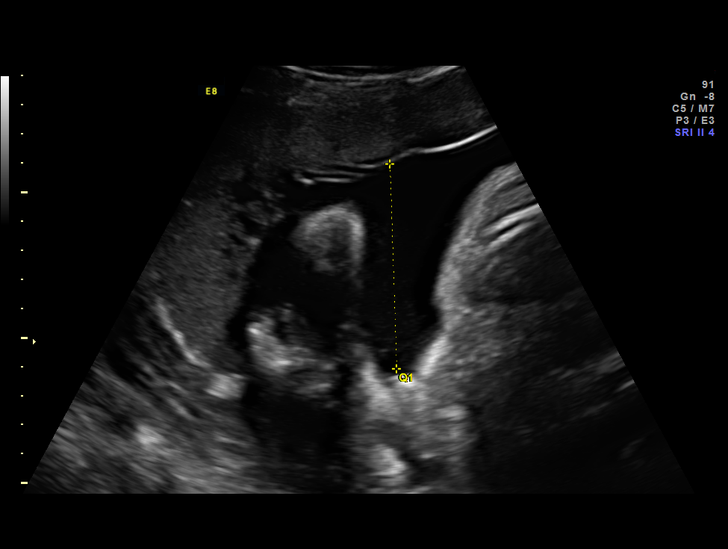
[im 10/22]
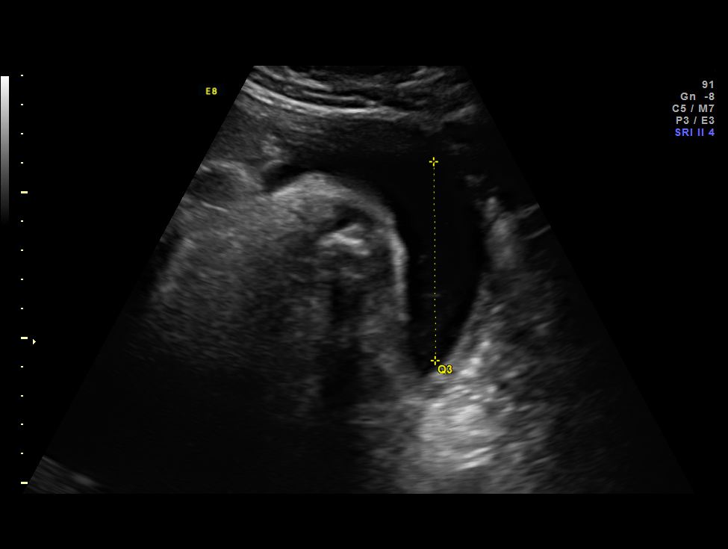
[im 12/22]
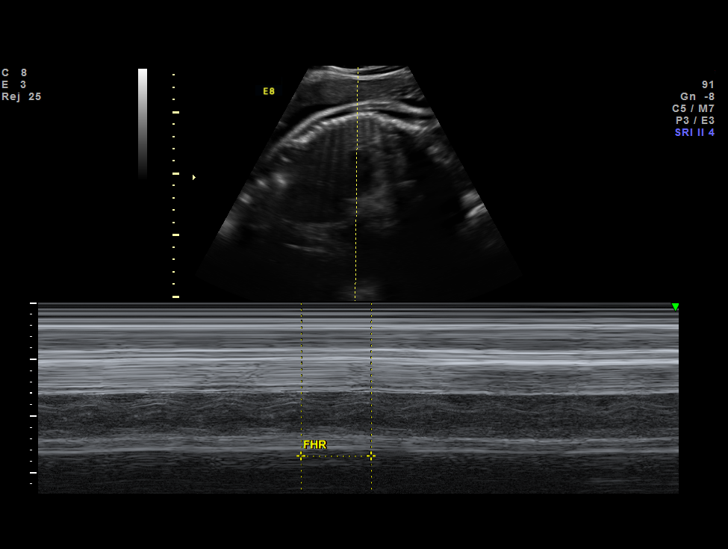
[im 13/22]
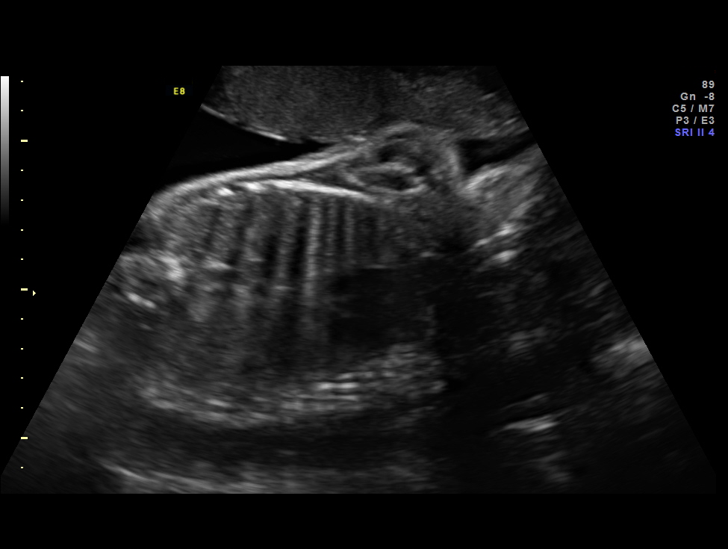
[im 15/22]
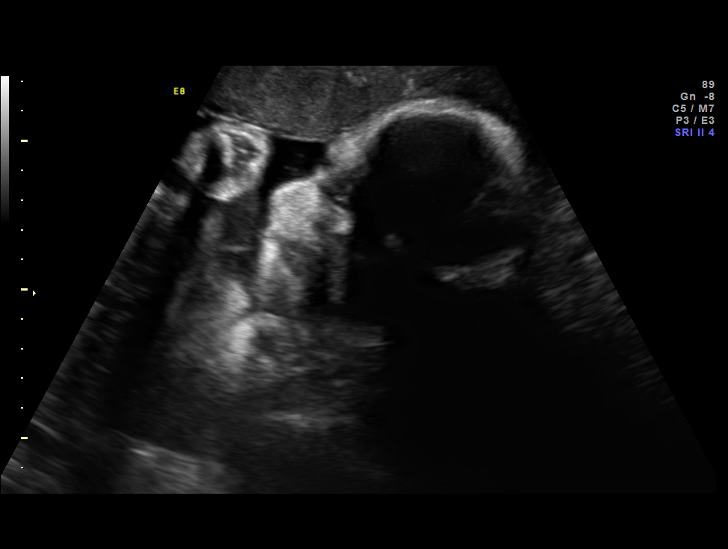
[im 17/22]
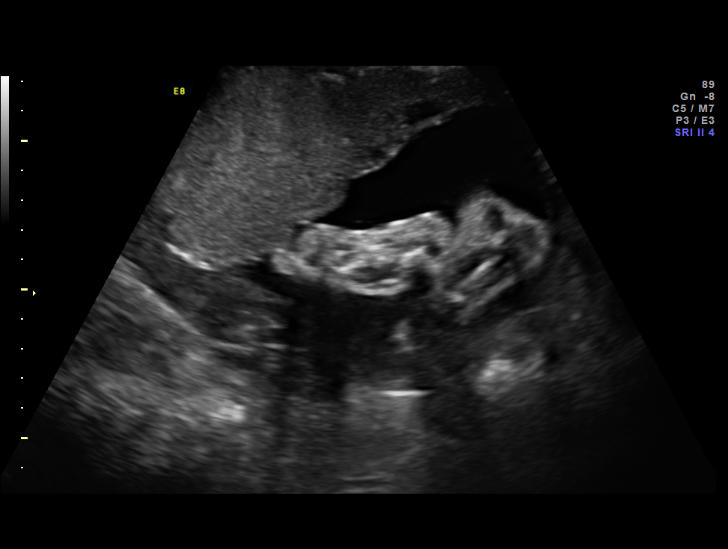
[im 18/22]
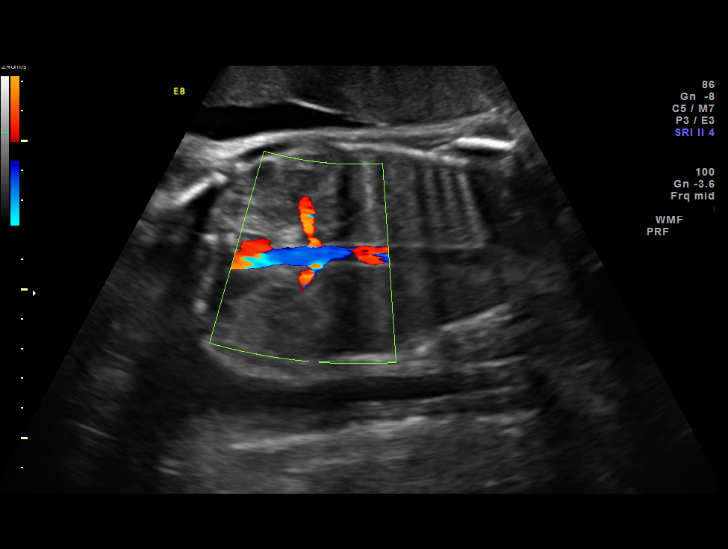
[im 20/22]
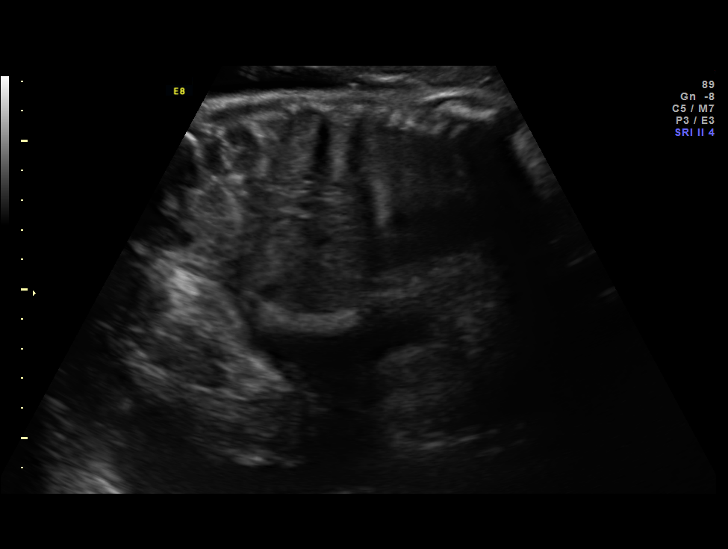
[im 22/22]
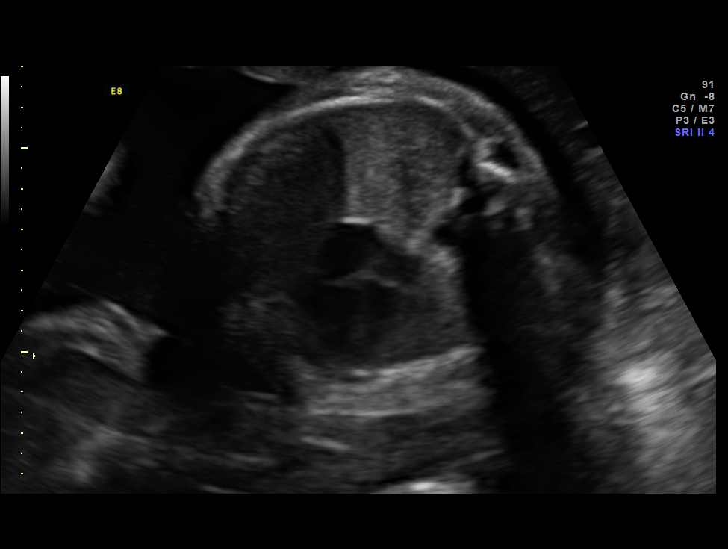

[13 of 22 positions shown; findings below may reference images not displayed]

OBSTETRICS REPORT
                      (Signed Final 11/17/2012 [DATE])

Service(s) Provided

 [HOSPITAL]                                         76815.0
Indications

 Hypertension - Chronic/Pre-existing; no meds
 during pregnancy
 Poor obstetric history: perinatal demise - ?
 abruption
 Cigarette smoker
 Previous cesarean section
 Probable mild gestational thrombocytopenia
Fetal Evaluation

 Num Of Fetuses:    1
 Fetal Heart Rate:  144                          bpm
 Cardiac Activity:  Observed
 Presentation:      Cephalic
 Placenta:          Anterior, above cervical os
 P. Cord            Previously Visualized
 Insertion:

 Amniotic Fluid
 AFI FV:      Subjectively within normal limits
 AFI Sum:     24.58   cm       95  %Tile     Larg Pckt:    7.38  cm
 RUQ:   7.05    cm   RLQ:    3.31   cm    LUQ:   7.38    cm   LLQ:    6.84   cm
Biophysical Evaluation

 N.S.T:          Reactive
Gestational Age

 Best:          33w 1d     Det. By:  Previous Ultrasound      EDD:   01/04/13
                                     (07/21/12)
Cervix Uterus Adnexa

 Cervix:       Not visualized (advanced GA >90wks)
Impression

 IUP at 33+1 weeks
 Normal amniotic fluid volume
 NST reactive
Recommendations

 Continue twice weekly NSTs with weekly AFIs
 Follow platelets every 2 weeks
 Consider delivery at 37-38 weeks

 questions or concerns.

## 2014-02-08 ENCOUNTER — Inpatient Hospital Stay (HOSPITAL_COMMUNITY)
Admission: AD | Admit: 2014-02-08 | Discharge: 2014-02-08 | Disposition: A | Discharge status: Home / Self Care | Payer: Medicaid Other | Source: Ambulatory Visit | Attending: Obstetrics & Gynecology | Admitting: Obstetrics & Gynecology

## 2014-02-08 ENCOUNTER — Encounter (HOSPITAL_COMMUNITY): Payer: Self-pay

## 2014-02-08 DIAGNOSIS — D696 Thrombocytopenia, unspecified: Secondary | ICD-10-CM | POA: Insufficient documentation

## 2014-02-08 DIAGNOSIS — I10 Essential (primary) hypertension: Secondary | ICD-10-CM | POA: Insufficient documentation

## 2014-02-08 DIAGNOSIS — R109 Unspecified abdominal pain: Secondary | ICD-10-CM | POA: Diagnosis present

## 2014-02-08 DIAGNOSIS — F17200 Nicotine dependence, unspecified, uncomplicated: Secondary | ICD-10-CM | POA: Diagnosis not present

## 2014-02-08 DIAGNOSIS — N390 Urinary tract infection, site not specified: Secondary | ICD-10-CM | POA: Insufficient documentation

## 2014-02-08 LAB — POCT PREGNANCY, URINE: Preg Test, Ur: NEGATIVE

## 2014-02-08 LAB — URINALYSIS, ROUTINE W REFLEX MICROSCOPIC
Bilirubin Urine: NEGATIVE
Glucose, UA: NEGATIVE mg/dL
Hgb urine dipstick: NEGATIVE
Ketones, ur: NEGATIVE mg/dL
Leukocytes, UA: NEGATIVE
Nitrite: POSITIVE — AB
Protein, ur: NEGATIVE mg/dL
Specific Gravity, Urine: >1.03 — ABNORMAL HIGH (ref 1.005–1.030)
Urobilinogen, UA: 1 mg/dL (ref 0.0–1.0)
pH: 6 (ref 5.0–8.0)

## 2014-02-08 LAB — URINE MICROSCOPIC-ADD ON

## 2014-02-08 MED ORDER — PHENAZOPYRIDINE HCL 100 MG PO TABS: 95.0000 mg | ORAL_TABLET | Freq: Three times a day (TID) | ORAL | Status: DC | PRN

## 2014-02-08 MED ORDER — CIPROFLOXACIN HCL 250 MG PO TABS
250.0000 mg | ORAL_TABLET | Freq: Once | ORAL | Status: AC
  Administered 2014-02-08: 250 mg via ORAL
  Filled 2014-02-08: qty 1

## 2014-02-08 MED ORDER — CIPROFLOXACIN HCL 500 MG PO TABS: 500.0000 mg | ORAL_TABLET | Freq: Two times a day (BID) | ORAL | Status: DC

## 2014-02-08 MED ORDER — PHENAZOPYRIDINE HCL 100 MG PO TABS
200.0000 mg | ORAL_TABLET | Freq: Once | ORAL | Status: AC
Start: 2014-02-08 — End: 2014-02-08
  Administered 2014-02-08: 200 mg via ORAL
  Filled 2014-02-08: qty 2

## 2014-02-08 NOTE — MAU Note (Signed)
States lower abdominal pain that is "just there. Not cramping, but kind of like a bladder infection." Pt states a lot of pressure with urination, but no burning or pain. States increase in frequency as well. Denies vaginal bleeding or discharge. LMP: January 20, 2014.

## 2014-02-08 NOTE — MAU Provider Note (Signed)
History     CSN: [id number]  Arrival date and time: 02/08/14 2026   First Provider Initiated Contact with Patient 02/08/14 2054      Chief Complaint  Patient presents with  . Abdominal Pain   HPI  Terri Bradford is a 33 y.o. [id number] who presents today with lower abdominal pain, burning, pressure and pain with urination. She states that these symptoms started yesterday, and that it feels like a UTI.   Past Medical History  Diagnosis Date  . Hypertension   . Thrombocytopenia, unspecified     Thrombocytopenia    Past Surgical History  Procedure Laterality Date  . Cesarean section      History reviewed. No pertinent family history.  History  Substance Use Topics  . Smoking status: Current Every Day Smoker -- 0.50 packs/day    Types: Cigarettes  . Smokeless tobacco: Not on file  . Alcohol Use: Yes     Comment: 2 weeks    Allergies: No Known Allergies  Prescriptions prior to admission  Medication Sig Dispense Refill  . lisinopril-hydrochlorothiazide (PRINZIDE,ZESTORETIC) 20-12.5 MG per tablet Take 1 tablet by mouth daily.        ROS Physical Exam   Blood pressure 119/76, pulse 90, temperature 98.6 F (37 C), temperature source Oral, resp. rate 18, height 5\' 3"  (1.6 m), weight 69.854 kg (154 lb), last menstrual period 01/20/2014.  Physical Exam  Nursing note and vitals reviewed. Constitutional: She is oriented to person, place, and time. She appears well-developed and well-nourished. No distress.  Cardiovascular: Normal rate.   Respiratory: Effort normal.  GI: Soft. There is no tenderness. There is no rebound.  Neurological: She is alert and oriented to person, place, and time.  Skin: Skin is warm and dry.  Psychiatric: She has a normal mood and affect.    MAU Course  Procedures  Results for orders placed during the hospital encounter of 02/08/14 (from the past 24 hour(s))  URINALYSIS, ROUTINE W REFLEX MICROSCOPIC     Status: Abnormal   Collection Time    02/08/14  8:31 PM      Result Value Ref Range   Color, Urine YELLOW  YELLOW   APPearance CLEAR  CLEAR   Specific Gravity, Urine >1.030 (*) 1.005 - 1.030   pH 6.0  5.0 - 8.0   Glucose, UA NEGATIVE  NEGATIVE mg/dL   Hgb urine dipstick NEGATIVE  NEGATIVE   Bilirubin Urine NEGATIVE  NEGATIVE   Ketones, ur NEGATIVE  NEGATIVE mg/dL   Protein, ur NEGATIVE  NEGATIVE mg/dL   Urobilinogen, UA 1.0  0.0 - 1.0 mg/dL   Nitrite POSITIVE (*) NEGATIVE   Leukocytes, UA NEGATIVE  NEGATIVE  URINE MICROSCOPIC-ADD ON     Status: Abnormal   Collection Time    02/08/14  8:31 PM      Result Value Ref Range   Squamous Epithelial / LPF FEW (*) RARE   WBC, UA 0-2  <3 WBC/hpf   RBC / HPF 3-6  <3 RBC/hpf   Bacteria, UA MANY (*) RARE  POCT PREGNANCY, URINE     Status: None   Collection Time    02/08/14  8:37 PM      Result Value Ref Range   Preg Test, Ur NEGATIVE  NEGATIVE    Assessment and Plan   1. UTI (lower urinary tract infection)   Urine culture pending     Medication List         ciprofloxacin 500 MG tablet  Commonly known  as:  CIPRO  Take 1 tablet (500 mg total) by mouth 2 (two) times daily.     lisinopril-hydrochlorothiazide 20-12.5 MG per tablet  Commonly known as:  PRINZIDE,ZESTORETIC  Take 1 tablet by mouth daily.     phenazopyridine 100 MG tablet  Commonly known as:  PYRIDIUM  Take 1 tablet (100 mg total) by mouth 3 (three) times daily as needed for pain.       Follow-up Information   Follow up with Kathreen Cosier, MD. (If symptoms worsen)    Specialty:  Obstetrics and Gynecology   Contact information:   [address] [place] [address] [phone]        Tawnya Crook 02/08/2014, 8:55 PM

## 2014-02-08 NOTE — Discharge Instructions (Signed)

## 2014-02-10 LAB — URINE CULTURE

## 2014-02-11 NOTE — MAU Provider Note (Signed)
Attestation of Attending Supervision of Advanced Practitioner (PA/CNM/NP): Evaluation and management procedures were performed by the Advanced Practitioner under my supervision and collaboration.  I have reviewed the Advanced Practitioner's note and chart, and I agree with the management and plan.  Arzu Mcgaughey, MD, FACOG Attending Obstetrician & Gynecologist Faculty Practice, [facility]   

## 2014-05-19 ENCOUNTER — Emergency Department (HOSPITAL_COMMUNITY)
Admission: EM | Admit: 2014-05-19 | Discharge: 2014-05-19 | Disposition: A | Discharge status: Home / Self Care | Payer: Medicaid Other | Attending: Emergency Medicine | Admitting: Emergency Medicine

## 2014-05-19 ENCOUNTER — Encounter (HOSPITAL_COMMUNITY): Payer: Self-pay | Admitting: Emergency Medicine

## 2014-05-19 ENCOUNTER — Emergency Department (HOSPITAL_COMMUNITY): Payer: Medicaid Other

## 2014-05-19 DIAGNOSIS — M25511 Pain in right shoulder: Secondary | ICD-10-CM | POA: Diagnosis not present

## 2014-05-19 DIAGNOSIS — Z862 Personal history of diseases of the blood and blood-forming organs and certain disorders involving the immune mechanism: Secondary | ICD-10-CM | POA: Insufficient documentation

## 2014-05-19 DIAGNOSIS — Z791 Long term (current) use of non-steroidal anti-inflammatories (NSAID): Secondary | ICD-10-CM | POA: Insufficient documentation

## 2014-05-19 DIAGNOSIS — Z720 Tobacco use: Secondary | ICD-10-CM | POA: Diagnosis not present

## 2014-05-19 DIAGNOSIS — Z79899 Other long term (current) drug therapy: Secondary | ICD-10-CM | POA: Diagnosis not present

## 2014-05-19 DIAGNOSIS — R0789 Other chest pain: Secondary | ICD-10-CM

## 2014-05-19 DIAGNOSIS — M79601 Pain in right arm: Secondary | ICD-10-CM | POA: Insufficient documentation

## 2014-05-19 DIAGNOSIS — I10 Essential (primary) hypertension: Secondary | ICD-10-CM | POA: Insufficient documentation

## 2014-05-19 DIAGNOSIS — E876 Hypokalemia: Secondary | ICD-10-CM

## 2014-05-19 DIAGNOSIS — R079 Chest pain, unspecified: Secondary | ICD-10-CM | POA: Diagnosis present

## 2014-05-19 LAB — I-STAT TROPONIN, ED: Troponin i, poc: 0 ng/mL (ref 0.00–0.08)

## 2014-05-19 LAB — BASIC METABOLIC PANEL
Anion gap: 13 (ref 5–15)
BUN: 8 mg/dL (ref 6–23)
CO2: 25 mEq/L (ref 19–32)
Calcium: 8.9 mg/dL (ref 8.4–10.5)
Chloride: 101 mEq/L (ref 96–112)
Creatinine, Ser: 0.88 mg/dL (ref 0.50–1.10)
GFR calc Af Amer: >90 mL/min (ref >90)
GFR calc non Af Amer: 84 mL/min — ABNORMAL LOW (ref >90)
Glucose, Bld: 131 mg/dL — ABNORMAL HIGH (ref 70–99)
Potassium: 3.3 mEq/L — ABNORMAL LOW (ref 3.7–5.3)
Sodium: 139 mEq/L (ref 137–147)

## 2014-05-19 LAB — CBC
HCT: 39.2 % (ref 36.0–46.0)
Hemoglobin: 13.3 g/dL (ref 12.0–15.0)
MCH: 30.9 pg (ref 26.0–34.0)
MCHC: 33.9 g/dL (ref 30.0–36.0)
MCV: 91 fL (ref 78.0–100.0)
Platelets: 120 10*3/uL — ABNORMAL LOW (ref 150–400)
RBC: 4.31 MIL/uL (ref 3.87–5.11)
RDW: 12.8 % (ref 11.5–15.5)
WBC: 6 10*3/uL (ref 4.0–10.5)

## 2014-05-19 MED ORDER — NAPROXEN 500 MG PO TABS: 500.0000 mg | ORAL_TABLET | Freq: Two times a day (BID) | ORAL | Status: DC

## 2014-05-19 MED ORDER — KETOROLAC TROMETHAMINE 10 MG PO TABS
10.0000 mg | ORAL_TABLET | Freq: Once | ORAL | Status: AC
  Administered 2014-05-19: 10 mg via ORAL
  Filled 2014-05-19: qty 1

## 2014-05-19 MED ORDER — POTASSIUM CHLORIDE CRYS ER 20 MEQ PO TBCR
80.0000 meq | EXTENDED_RELEASE_TABLET | Freq: Once | ORAL | Status: AC
  Administered 2014-05-19: 80 meq via ORAL
  Filled 2014-05-19: qty 4

## 2014-05-19 NOTE — ED Provider Notes (Signed)
CSN: [id number]     Arrival date & time 05/19/14  1625 History   First MD Initiated Contact with Patient 05/19/14 1719     Chief Complaint  Patient presents with  . right arm pain      (Consider location/radiation/quality/duration/timing/severity/associated sxs/prior Treatment) The history is provided by the patient and medical records. No language interpreter was used.    Terri Bradford is a 33 y.o. female  with a hx of hypertension, thrombocytopenia presents to the Emergency Department complaining of intermittent, persistent central chest pain onset last night.  Patient reports pain is worse with movement and deep breaths, is relieved while lying still.  Patient denies personal or family cardiac history. Associated symptoms include right arm pain that radiates from her right shoulder to her right elbow, worse with movement and palpation. Patient denies new exercises or heavy lifting. She works in a [employer].  Patient was seen approximately 1.5 years ago for significant hypertension however she reports that she is now well controlled on her lisinopril and hydrochlorothiazide. She reports no headache or vision changes.  She does not have chest pain or shortness of breath with exertion.  She has no associated nausea, diaphoresis, weakness, syncope or near syncope. Patient denies exogenous estrogen, recent surgery, recent or swelling of either leg.   Past Medical History  Diagnosis Date  . Hypertension   . Thrombocytopenia, unspecified     Thrombocytopenia   Past Surgical History  Procedure Laterality Date  . Cesarean section     No family history on file. History  Substance Use Topics  . Smoking status: Current Every Day Smoker -- 0.50 packs/day    Types: Cigarettes  . Smokeless tobacco: Not on file  . Alcohol Use: Yes     Comment: 2 weeks   OB History   Grav Para Term Preterm Abortions TAB SAB Ect Mult Living   3 3 3  0 0 0 0 0 0 2     Review of Systems   Constitutional: Negative for fever, diaphoresis, appetite change, fatigue and unexpected weight change.  HENT: Negative for mouth sores.   Eyes: Negative for visual disturbance.  Respiratory: Negative for cough, chest tightness, shortness of breath and wheezing.   Cardiovascular: Positive for chest pain (Central, with movement).  Gastrointestinal: Negative for nausea, vomiting, abdominal pain, diarrhea and constipation.  Endocrine: Negative for polydipsia, polyphagia and polyuria.  Genitourinary: Negative for dysuria, urgency, frequency and hematuria.  Musculoskeletal: Positive for arthralgias (right shoulder) and myalgias (right arm). Negative for back pain and neck stiffness.  Skin: Negative for rash.  Allergic/Immunologic: Negative for immunocompromised state.  Neurological: Negative for syncope, light-headedness and headaches.  Hematological: Does not bruise/bleed easily.  Psychiatric/Behavioral: Negative for sleep disturbance. The patient is not nervous/anxious.       Allergies  Review of patient's allergies indicates no known allergies.  Home Medications   Prior to Admission medications   Medication Sig Start Date End Date Taking? Authorizing Provider  lisinopril-hydrochlorothiazide (PRINZIDE,ZESTORETIC) 10-12.5 MG per tablet Take 1 tablet by mouth daily.   Yes Historical Provider, MD  naproxen (NAPROSYN) 500 MG tablet Take 1 tablet (500 mg total) by mouth 2 (two) times daily with a meal. 05/19/14   Nadezhda Pollitt, PA-C   BP 112/61  Pulse 53  Temp(Src) 98.4 F (36.9 C) (Oral)  Resp 18  Ht 5\' 2"  (1.575 m)  SpO2 100%  LMP 04/22/2014 Physical Exam  Nursing note and vitals reviewed. Constitutional: She is oriented to person, place, and  time. She appears well-developed and well-nourished. No distress.  Awake, alert, nontoxic appearance  HENT:  Head: Normocephalic and atraumatic.  Mouth/Throat: Oropharynx is clear and moist. No oropharyngeal exudate.  Eyes:  Conjunctivae are normal. No scleral icterus.  Neck: Normal range of motion. Neck supple.  Full range of motion of the C-spine without pain Neck no midline or paraspinal tenderness to the C-spine  Cardiovascular: Normal rate, regular rhythm, normal heart sounds and intact distal pulses.   No murmur heard. Pulmonary/Chest: Effort normal and breath sounds normal. No respiratory distress. She has no wheezes. She exhibits tenderness.  Equal chest expansion Mild anterior chest tenderness  Abdominal: Soft. Bowel sounds are normal. She exhibits no mass. There is no tenderness. There is no rebound and no guarding.  Musculoskeletal: Normal range of motion. She exhibits no edema.  Full range of motion of right shoulder with reproduction of chest pain and right shoulder and right arm pain Full range of motion of left shoulder without reproduction of chest pain or right arm pain No midline or paraspinal T-spine or L-spine tenderness  Neurological: She is alert and oriented to person, place, and time. She exhibits normal muscle tone. Coordination normal.  Speech is clear and goal oriented Moves extremities without ataxia  Skin: Skin is warm and dry. She is not diaphoretic. No erythema.  Psychiatric: She has a normal mood and affect.    ED Course  Procedures (including critical care time) Labs Review Labs Reviewed  CBC - Abnormal; Notable for the following:    Platelets 120 (*)    All other components within normal limits  BASIC METABOLIC PANEL - Abnormal; Notable for the following:    Potassium 3.3 (*)    Glucose, Bld 131 (*)    GFR calc non Af Amer 84 (*)    All other components within normal limits  I-STAT TROPOININ, ED    Imaging Review Dg Chest 2 View  05/19/2014   CLINICAL DATA:  Mid chest pain, 2 weeks duration. Extension into the right arm.  EXAM: CHEST  2 VIEW  COMPARISON:  02/24/2013  FINDINGS: Heart size is normal. Mediastinal shadows are normal. Lungs are clear. No effusions. No  bony abnormalities.  IMPRESSION: Normal chest   Electronically Signed   By: Paulina FusiMark  Shogry M.D.   On: 05/19/2014 18:35     EKG Interpretation None      ECG:  Date: 05/19/2014  Rate: 65  Rhythm: normal sinus rhythm  QRS Axis: normal  Intervals: normal  ST/T Wave abnormalities: normal  Conduction Disutrbances:none  Narrative Interpretation: Nonischemic ECG, unchanged from July 2014  Old EKG Reviewed: unchanged    MDM   Final diagnoses:  Chest wall pain  Hypokalemia   Kerilyn Marzella SchleinM Chausse presents with chest pain reproducible with palpation in the right arm movement and with deep breaths.  Patient declines her current pain medication. We'll give Toradol and reassess. Labs and chest x-ray pending.  8:46 PM Pt with mild hypokalemia repleted in the emergency department. Patient with improvement of chest pain after administration of Toradol. Chest pain remains reproducible and without associated symptoms.   Patient is to be discharged with recommendation to follow up with PCP in regards to today's hospital visit. Chest pain is not likely of cardiac or pulmonary etiology d/t presentation, PERC negative, VSS, no tracheal deviation, no JVD or new murmur, RRR, breath sounds equal bilaterally, EKG without acute abnormalities, negative troponin, and negative CXR. Pt has been advised to return to the ED if CP  becomes exertional, associated with diaphoresis or nausea, radiates to left jaw/arm, worsens or becomes concerning in any way. Pt appears reliable for follow up and is agreeable to discharge.   Case has been discussed with Dr. Micheline Mazeocherty who agrees with the above plan to discharge.   BP 112/61  Pulse 53  Temp(Src) 98.4 F (36.9 C) (Oral)  Resp 18  Ht 5\' 2"  (1.575 m)  SpO2 100%  LMP 04/22/2014   Dierdre ForthHannah Akasha Melena, PA-C 05/19/14 2049

## 2014-05-19 NOTE — ED Notes (Signed)
Pt from home c/o right pain x 2 weeks and chest pain since last pm. Painful when taking deep breaths. Denies nausea or vomiting.

## 2014-05-19 NOTE — ED Provider Notes (Signed)
Medical screening examination/treatment/procedure(s) were performed by non-physician practitioner and as supervising physician I was immediately available for consultation/collaboration.   EKG Interpretation None        Toy CookeyMegan Ruslan Mccabe, MD 05/19/14 2324

## 2014-05-19 NOTE — Discharge Instructions (Signed)
1. Medications: naprosyn, usual home medications 2. Treatment: rest, drink plenty of fluids,  3. Follow Up: Please followup with your primary doctor in 3 days for discussion of your diagnoses and further evaluation after today's visit;   Chest Wall Pain Chest wall pain is pain in or around the bones and muscles of your chest. It may take up to 6 weeks to get better. It may take longer if you must stay physically active in your work and activities.  CAUSES  Chest wall pain may happen on its own. However, it may be caused by:  A viral illness like the flu.  Injury.  Coughing.  Exercise.  Arthritis.  Fibromyalgia.  Shingles. HOME CARE INSTRUCTIONS   Avoid overtiring physical activity. Try not to strain or perform activities that cause pain. This includes any activities using your chest or your abdominal and side muscles, especially if heavy weights are used.  Put ice on the sore area.  Put ice in a plastic bag.  Place a towel between your skin and the bag.  Leave the ice on for 15-20 minutes per hour while awake for the first 2 days.  Only take over-the-counter or prescription medicines for pain, discomfort, or fever as directed by your caregiver. SEEK IMMEDIATE MEDICAL CARE IF:   Your pain increases, or you are very uncomfortable.  You have a fever.  Your chest pain becomes worse.  You have new, unexplained symptoms.  You have nausea or vomiting.  You feel sweaty or lightheaded.  You have a cough with phlegm (sputum), or you cough up blood. MAKE SURE YOU:   Understand these instructions.  Will watch your condition.  Will get help right away if you are not doing well or get worse. Document Released: 07/19/2005 Document Revised: 10/11/2011 Document Reviewed: 03/15/2011 [facility] Patient Information 2015 [place], [place]. This information is not intended to replace advice given to you by your health care provider. Make sure you discuss any questions you have with  your health care provider.

## 2014-06-03 ENCOUNTER — Encounter (HOSPITAL_COMMUNITY): Payer: Self-pay | Admitting: Emergency Medicine

## 2014-07-18 ENCOUNTER — Encounter (HOSPITAL_COMMUNITY): Payer: Self-pay | Admitting: Emergency Medicine

## 2014-07-18 ENCOUNTER — Emergency Department (HOSPITAL_COMMUNITY)
Admission: EM | Admit: 2014-07-18 | Discharge: 2014-07-18 | Disposition: A | Discharge status: Home / Self Care | Payer: Medicaid Other | Attending: Emergency Medicine | Admitting: Emergency Medicine

## 2014-07-18 DIAGNOSIS — M79601 Pain in right arm: Secondary | ICD-10-CM

## 2014-07-18 DIAGNOSIS — M79621 Pain in right upper arm: Secondary | ICD-10-CM | POA: Insufficient documentation

## 2014-07-18 DIAGNOSIS — Z3202 Encounter for pregnancy test, result negative: Secondary | ICD-10-CM | POA: Diagnosis not present

## 2014-07-18 DIAGNOSIS — Z862 Personal history of diseases of the blood and blood-forming organs and certain disorders involving the immune mechanism: Secondary | ICD-10-CM | POA: Insufficient documentation

## 2014-07-18 DIAGNOSIS — Z79899 Other long term (current) drug therapy: Secondary | ICD-10-CM | POA: Diagnosis not present

## 2014-07-18 DIAGNOSIS — Z720 Tobacco use: Secondary | ICD-10-CM | POA: Diagnosis not present

## 2014-07-18 DIAGNOSIS — R110 Nausea: Secondary | ICD-10-CM | POA: Insufficient documentation

## 2014-07-18 DIAGNOSIS — I10 Essential (primary) hypertension: Secondary | ICD-10-CM | POA: Diagnosis not present

## 2014-07-18 LAB — CBC WITH DIFFERENTIAL/PLATELET
Basophils Absolute: 0 10*3/uL (ref 0.0–0.1)
Basophils Relative: 0 % (ref 0–1)
Eosinophils Absolute: 0.1 10*3/uL (ref 0.0–0.7)
Eosinophils Relative: 1 % (ref 0–5)
HCT: 44.2 % (ref 36.0–46.0)
Hemoglobin: 15 g/dL (ref 12.0–15.0)
Lymphocytes Relative: 52 % — ABNORMAL HIGH (ref 12–46)
Lymphs Abs: 3.5 10*3/uL (ref 0.7–4.0)
MCH: 30.7 pg (ref 26.0–34.0)
MCHC: 33.9 g/dL (ref 30.0–36.0)
MCV: 90.6 fL (ref 78.0–100.0)
Monocytes Absolute: 0.7 10*3/uL (ref 0.1–1.0)
Monocytes Relative: 10 % (ref 3–12)
Neutro Abs: 2.5 10*3/uL (ref 1.7–7.7)
Neutrophils Relative %: 37 % — ABNORMAL LOW (ref 43–77)
Platelets: 120 10*3/uL — ABNORMAL LOW (ref 150–400)
RBC: 4.88 MIL/uL (ref 3.87–5.11)
RDW: 13.3 % (ref 11.5–15.5)
WBC: 6.7 10*3/uL (ref 4.0–10.5)

## 2014-07-18 LAB — COMPREHENSIVE METABOLIC PANEL
ALT: 21 U/L (ref 0–35)
AST: 21 U/L (ref 0–37)
Albumin: 3.8 g/dL (ref 3.5–5.2)
Alkaline Phosphatase: 91 U/L (ref 39–117)
Anion gap: 12 (ref 5–15)
BUN: 8 mg/dL (ref 6–23)
CO2: 26 mEq/L (ref 19–32)
Calcium: 9.5 mg/dL (ref 8.4–10.5)
Chloride: 101 mEq/L (ref 96–112)
Creatinine, Ser: 0.8 mg/dL (ref 0.50–1.10)
GFR calc Af Amer: >90 mL/min (ref >90)
GFR calc non Af Amer: >90 mL/min (ref >90)
Glucose, Bld: 92 mg/dL (ref 70–99)
Potassium: 3.8 mEq/L (ref 3.7–5.3)
Sodium: 139 mEq/L (ref 137–147)
Total Bilirubin: 0.3 mg/dL (ref 0.3–1.2)
Total Protein: 7.5 g/dL (ref 6.0–8.3)

## 2014-07-18 LAB — URINALYSIS, ROUTINE W REFLEX MICROSCOPIC
Bilirubin Urine: NEGATIVE
Glucose, UA: NEGATIVE mg/dL
Hgb urine dipstick: NEGATIVE
Ketones, ur: NEGATIVE mg/dL
Leukocytes, UA: NEGATIVE
Nitrite: NEGATIVE
Protein, ur: NEGATIVE mg/dL
Specific Gravity, Urine: 1.021 (ref 1.005–1.030)
Urobilinogen, UA: 1 mg/dL (ref 0.0–1.0)
pH: 6 (ref 5.0–8.0)

## 2014-07-18 LAB — LIPASE, BLOOD: Lipase: 54 U/L (ref 11–59)

## 2014-07-18 LAB — POC URINE PREG, ED: Preg Test, Ur: NEGATIVE

## 2014-07-18 MED ORDER — IBUPROFEN 600 MG PO TABS: 600.0000 mg | ORAL_TABLET | Freq: Three times a day (TID) | ORAL | Status: DC

## 2014-07-18 MED ORDER — IBUPROFEN 800 MG PO TABS
800.0000 mg | ORAL_TABLET | Freq: Once | ORAL | Status: AC
  Administered 2014-07-18: 800 mg via ORAL
  Filled 2014-07-18: qty 1

## 2014-07-18 NOTE — ED Notes (Addendum)
C/o right arm pain [id number], no trauma, no meds pta, also c/o of abd pain X2 days (last period Aug), vomiting, no diarrhea, A/O X4, ambulatory and in NAD

## 2014-07-18 NOTE — ED Notes (Signed)
Pt. Refused wheelchair and left with all belongings 

## 2014-07-18 NOTE — ED Provider Notes (Signed)
CSN: [id number]     Arrival date & time 07/18/14  1820 History   First MD Initiated Contact with Patient 07/18/14 1944     Chief Complaint  Patient presents with  . Arm Injury     (Consider location/radiation/quality/duration/timing/severity/associated sxs/prior Treatment) HPI  Terri Bradford is a 33 y.o. female with PMH of hypertension, thrombocytopenia presenting with  one month history of right arm pain. She denies any trauma and has not taken anything for the pain. She does work at a [employer] is constantly using her hands. She does note that it is worse at the end of the day. She denies fevers, chills. She denies numbness, tingling, weakness. No erythema, swelling. Patient also notes she has had mild aching abdominal pain for 2 days. She is completely asymptomatic at this time. At times she has mild nausea but no vomiting or diarrhea. Patient not concerned with this pain but more concerned that her last period was in August and that she may be pregnant. No fevers or chills. Patient is eating and drinking like normal. No back pain. No vaginal complaints or urinary complaints.   Past Medical History  Diagnosis Date  . Hypertension   . Thrombocytopenia, unspecified     Thrombocytopenia   Past Surgical History  Procedure Laterality Date  . Cesarean section     No family history on file. History  Substance Use Topics  . Smoking status: Current Every Day Smoker -- 0.50 packs/day    Types: Cigarettes  . Smokeless tobacco: Not on file  . Alcohol Use: Yes     Comment: 2 weeks   OB History    Gravida Para Term Preterm AB TAB SAB Ectopic Multiple Living   3 3 3  0 0 0 0 0 0 2     Review of Systems  Constitutional: Negative for fever and chills.  HENT: Negative for congestion and rhinorrhea.   Eyes: Negative for visual disturbance.  Respiratory: Negative for cough and shortness of breath.   Cardiovascular: Negative for chest pain and palpitations.  Gastrointestinal:  Positive for nausea. Negative for vomiting and diarrhea.  Genitourinary: Negative for dysuria and hematuria.  Musculoskeletal: Negative for back pain and gait problem.  Skin: Negative for rash.  Neurological: Negative for weakness and headaches.      Allergies  Review of patient's allergies indicates no known allergies.  Home Medications   Prior to Admission medications   Medication Sig Start Date End Date Taking? Authorizing Provider  lisinopril-hydrochlorothiazide (PRINZIDE,ZESTORETIC) 10-12.5 MG per tablet Take 1 tablet by mouth daily.   Yes Historical Provider, MD  ibuprofen (ADVIL,MOTRIN) 600 MG tablet Take 1 tablet (600 mg total) by mouth 3 (three) times daily. 07/18/14   Louann SjogrenVictoria L Nivaan Dicenzo, PA-C  naproxen (NAPROSYN) 500 MG tablet Take 1 tablet (500 mg total) by mouth 2 (two) times daily with a meal. Patient not taking: Reported on 07/18/2014 05/19/14   Hannah Muthersbaugh, PA-C   BP 110/70 mmHg  Pulse 55  Temp(Src) 97.6 F (36.4 C) (Oral)  Resp 18  Ht 5\' 3"  (1.6 m)  Wt 148 lb (67.132 kg)  BMI 26.22 kg/m2  SpO2 100%  LMP 03/18/2014 Physical Exam  Constitutional: She appears well-developed and well-nourished. No distress.  HENT:  Head: Normocephalic and atraumatic.  Mouth/Throat: Oropharynx is clear and moist.  Eyes: Conjunctivae and EOM are normal. Right eye exhibits no discharge. Left eye exhibits no discharge.  Cardiovascular: Normal rate, regular rhythm and normal heart sounds.   Pulmonary/Chest: Effort normal  and breath sounds normal. No respiratory distress. She has no wheezes.  Abdominal: Soft. Bowel sounds are normal. She exhibits no distension. There is no tenderness.  Musculoskeletal:  Tenderness to right anterior and lateral upper arm. No swelling, erythema. Full range of motion of right upper extremity without pain. 5 out of 5 strength in right upper extremity. Sensation intact. 2+ radial pulses equal bilaterally. Less than 3 seconds cap refill.   Neurological: She is alert. She exhibits normal muscle tone. Coordination normal.  Skin: Skin is warm and dry. She is not diaphoretic.  Nursing note and vitals reviewed.   ED Course  Procedures (including critical care time) Labs Review Labs Reviewed  CBC WITH DIFFERENTIAL - Abnormal; Notable for the following:    Platelets 120 (*)    Neutrophils Relative % 37 (*)    Lymphocytes Relative 52 (*)    All other components within normal limits  URINALYSIS, ROUTINE W REFLEX MICROSCOPIC - Abnormal; Notable for the following:    APPearance CLOUDY (*)    All other components within normal limits  LIPASE, BLOOD  COMPREHENSIVE METABOLIC PANEL  POC URINE PREG, ED    Imaging Review No results found.   EKG Interpretation None      MDM   Final diagnoses:  Nausea  Right arm pain   Patient with one month's worth of right arm pain that is worse with overuse. Patient has not taken anything for it. Trauma or injury. VSS. Neurovascularly intact. No erythema, swelling, limited range of motion. I doubt septic arthritis. Patient improved greatly with ibuprofen in the ED. Patient has been given a sling for comfort. She is to follow-up with her primary care provider/orthopedics if symptoms persist or worsen.  Patient also with 2 day history of abdominal pain and nausea. Patient without any pain currently. No fevers or chills. Patient eating and drinking like normally. Patient not concerned with the pain but concerned with her menses not occurring this month as well as concerns for pregnancy.  Patient not pregnant. Patient with benign abdomen. No signs of peritonitis. Labs reassuring. White count within normal limits. UA without signs of infection. Patient refused any abdominal imaging which I think is reasonable given she is afebrile, asymptomatic presentation in ED with benign exam, and reassuring laboratory workup. Patient given strict return precautions to return to the ED with return of her pain,  unable to tolerate fluids, vomiting blood or blood in her stool. [facility] concerns.  Discussed return precautions with patient. Discussed all results and patient verbalizes understanding and agrees with plan.      Louann SjogrenVictoria L Glennie Bose, PA-C 07/18/14 2205  Warnell Foresterrey Wofford, MD 07/19/14 [phone]

## 2014-07-18 NOTE — Discharge Instructions (Signed)
Return to the emergency room with worsening of symptoms, new symptoms or with symptoms that are concerning, especially for fevers, redness, swelling, red streaks or weakness. Or if your abdominal pain returns, unable to tolerate fluids by mouth, severe pain, vomiting blood or blood in her stool or you feel faint. RICE: Rest, Ice (three cycles of 20 mins on, 20mins off at least twice a day), compression/brace, elevation. Heating pad works well for back pain. Ibuprofen 400mg  (2 tablets 200mg ) every 5-6 hours for 3-5 days and then as needed for pain. Follow up with PCP/orthopedist if symptoms worsen or are persistent.

## 2014-09-02 ENCOUNTER — Emergency Department (HOSPITAL_COMMUNITY)
Admission: EM | Admit: 2014-09-02 | Discharge: 2014-09-02 | Disposition: A | Discharge status: Home / Self Care | Payer: Medicaid Other | Attending: Emergency Medicine | Admitting: Emergency Medicine

## 2014-09-02 ENCOUNTER — Encounter (HOSPITAL_COMMUNITY): Payer: Self-pay | Admitting: Adult Health

## 2014-09-02 DIAGNOSIS — Z720 Tobacco use: Secondary | ICD-10-CM | POA: Diagnosis not present

## 2014-09-02 DIAGNOSIS — Z791 Long term (current) use of non-steroidal anti-inflammatories (NSAID): Secondary | ICD-10-CM | POA: Diagnosis not present

## 2014-09-02 DIAGNOSIS — G8929 Other chronic pain: Secondary | ICD-10-CM | POA: Diagnosis not present

## 2014-09-02 DIAGNOSIS — I10 Essential (primary) hypertension: Secondary | ICD-10-CM | POA: Insufficient documentation

## 2014-09-02 DIAGNOSIS — M25511 Pain in right shoulder: Secondary | ICD-10-CM | POA: Diagnosis present

## 2014-09-02 DIAGNOSIS — Z862 Personal history of diseases of the blood and blood-forming organs and certain disorders involving the immune mechanism: Secondary | ICD-10-CM | POA: Insufficient documentation

## 2014-09-02 DIAGNOSIS — Z79899 Other long term (current) drug therapy: Secondary | ICD-10-CM | POA: Insufficient documentation

## 2014-09-02 NOTE — ED Notes (Signed)
Pt reports right arm and shoulder pain for 2-3 months of unknown origin.  Pt able to move arm, but with limited movement.  Pt reports being seen here 2 or 3 times for same complaint.  Pt alert and oriented.

## 2014-09-02 NOTE — ED Provider Notes (Signed)
CSN: [id number]     Arrival date & time 09/02/14  1616 History  This chart was scribed for non-physician practitioner, Wynetta EmeryNicole Ryo Klang, PA-C working with Rolan BuccoMelanie Belfi, MD by Greggory StallionKayla Andersen, ED scribe. This patient was seen in room TR04C/TR04C and the patient's care was started at 5:07 PM.   Chief Complaint  Patient presents with  . Arm Pain   The history is provided by the patient. No language interpreter was used.    HPI Comments: Terri Bradford is a 34 y.o. female who presents to the Emergency Department complaining of intermittent, throbbing right shoulder and arm pain that started 2-3 months ago. Denies injury. Rates pain 7/10 and states certain movements worsen pain. She has not seen her PCP for this. States she hasn't taken any medications for her symptoms. Pt is also complaining of right leg burning and itching due to her lisinopril. States started taking the lisinopril 3-4 years ago. She stopped taking the medication and reports relief of symptoms.   Past Medical History  Diagnosis Date  . Hypertension   . Thrombocytopenia, unspecified     Thrombocytopenia   Past Surgical History  Procedure Laterality Date  . Cesarean section     History reviewed. No pertinent family history. History  Substance Use Topics  . Smoking status: Current Every Day Smoker -- 0.50 packs/day    Types: Cigarettes  . Smokeless tobacco: Not on file  . Alcohol Use: Yes     Comment: 2 weeks   OB History    Gravida Para Term Preterm AB TAB SAB Ectopic Multiple Living   3 3 3  0 0 0 0 0 0 2     Review of Systems  A complete 10 system review of systems was obtained and all systems are negative except as noted in the HPI and PMH.   Allergies  Review of patient's allergies indicates no known allergies.  Home Medications   Prior to Admission medications   Medication Sig Start Date End Date Taking? Authorizing Provider  ibuprofen (ADVIL,MOTRIN) 600 MG tablet Take 1 tablet (600 mg total) by mouth 3  (three) times daily. 07/18/14   Louann SjogrenVictoria L Creech, PA-C  lisinopril-hydrochlorothiazide (PRINZIDE,ZESTORETIC) 10-12.5 MG per tablet Take 1 tablet by mouth daily.    Historical Provider, MD  naproxen (NAPROSYN) 500 MG tablet Take 1 tablet (500 mg total) by mouth 2 (two) times daily with a meal. Patient not taking: Reported on 07/18/2014 05/19/14   Dahlia ClientHannah Muthersbaugh, PA-C   BP 122/81 mmHg  Pulse 74  Temp(Src) 98.1 F (36.7 C) (Oral)  Resp 16  SpO2 100%  LMP 08/22/2014   Physical Exam  Constitutional: She is oriented to person, place, and time. She appears well-developed and well-nourished. No distress.  HENT:  Head: Normocephalic.  Eyes: Conjunctivae and EOM are normal.  Cardiovascular: Normal rate, regular rhythm and intact distal pulses.   Pulmonary/Chest: Effort normal and breath sounds normal. No stridor.  Abdominal: Soft. Bowel sounds are normal.  Musculoskeletal: Normal range of motion. She exhibits tenderness. She exhibits no edema.  Shoulder with no deformity. Slightly reduced range of motion to shoulder but patient is able to abduct greater than 90. Full range of motion to elbow. No focal or bony TTP. She is diffusely tender to palpation from shoulder to wrist. Drop arm negative. Neurovascularly intact   Neurological: She is alert and oriented to person, place, and time.  Psychiatric: She has a normal mood and affect.  Nursing note and vitals reviewed.   ED Course  Procedures (including critical care time)  DIAGNOSTIC STUDIES: Oxygen Saturation is 100% on RA, normal by my interpretation.    COORDINATION OF CARE: 5:09 PM-Discussed treatment plan which includes an anti-inflammatory with pt at bedside and pt agreed to plan. Will give pt an orthopedic referral and advised her to follow up with them and with her PCP.    Labs Review Labs Reviewed - No data to display  Imaging Review No results found.   EKG Interpretation None      MDM   Final diagnoses:   Chronic right shoulder pain   Filed Vitals:   09/02/14 1623 09/02/14 1718  BP: 122/81 114/87  Pulse: 74 98  Temp: 98.1 F (36.7 C) 98.3 F (36.8 C)  TempSrc: Oral Oral  Resp: 16 22  SpO2: 100% 99%    [name] is a pleasant 34 y.o. female presenting with diffuse right arm pain. States lifting makes worse, physical exam with slightly reduced range of motion. Patient with multiple visits for the same complaint, advised her she will need to follow with her primary care physician and I will give her an orthopedic referral. Patient states she occasionally has a burning sensation in the bilateral legs she thinks this is secondary to lisinopril, she's taken lisinopril for years. She states she's had this earning an itching sensation in her legs for years as well. Patient is recently self DC'd her BP medications however her blood pressure is normal in the ED today. Advised her to follow closely with her primary care physician and let them know that she will need an alternative blood pressure medication.   Evaluation does not show pathology that would require ongoing emergent intervention or inpatient treatment. Pt is hemodynamically stable and mentating appropriately. Discussed findings and plan with patient/guardian, who agrees with care plan. All questions answered. Return precautions discussed and outpatient follow up given.    I personally performed the services described in this documentation, which was scribed in my presence. The recorded information has been reviewed and is accurate.  Joni Reining Whitt Auletta, PA-C 09/03/14 1610  Rolan Bucco, MD 09/06/14 [phone]

## 2014-09-02 NOTE — ED Notes (Addendum)
Presents with right arm ongoing for months pain described as throbbing. CMS intact. Also reports that her bp medication makes her right leg burn and itch. Not taking the bp medication makes the leg burn and itching better. The m\bp med is lisinopril. She reports lifting at work makes her arm pain worse.

## 2014-09-02 NOTE — Discharge Instructions (Signed)
For pain control please take ibuprofen (also known as Motrin or Advil)  (this is normally 4 over the counter pills) 3 times a day  for 5 days. Take with food to minimize stomach irritation.   Please follow with your primary care doctor in the next 2 days for a check-up. They must obtain records for further management.   Do not hesitate to return to the Emergency Department for any new, worsening or concerning symptoms.    Arthralgia Your caregiver has diagnosed you as suffering from an arthralgia. Arthralgia means there is pain in a joint. This can come from many reasons including:  Bruising the joint which causes soreness (inflammation) in the joint.  Wear and tear on the joints which occur as we grow older (osteoarthritis).  Overusing the joint.  Various forms of arthritis.  Infections of the joint. Regardless of the cause of pain in your joint, most of these different pains respond to anti-inflammatory drugs and rest. The exception to this is when a joint is infected, and these cases are treated with antibiotics, if it is a bacterial infection. HOME CARE INSTRUCTIONS   Rest the injured area for as long as directed by your caregiver. Then slowly start using the joint as directed by your caregiver and as the pain allows. Crutches as directed may be useful if the ankles, knees or hips are involved. If the knee was splinted or casted, continue use and care as directed. If an stretchy or elastic wrapping bandage has been applied today, it should be removed and re-applied every 3 to 4 hours. It should not be applied tightly, but firmly enough to keep swelling down. Watch toes and feet for swelling, bluish discoloration, coldness, numbness or excessive pain. If any of these problems (symptoms) occur, remove the ace bandage and re-apply more loosely. If these symptoms persist, contact your caregiver or return to this location.  For the first 24 hours, keep the injured extremity elevated on  pillows while lying down.  Apply ice for 15-20 minutes to the sore joint every couple hours while awake for the first half day. Then 03-04 times per day for the first 48 hours. Put the ice in a plastic bag and place a towel between the bag of ice and your skin.  Wear any splinting, casting, elastic bandage applications, or slings as instructed.  Only take over-the-counter or prescription medicines for pain, discomfort, or fever as directed by your caregiver. Do not use aspirin immediately after the injury unless instructed by your physician. Aspirin can cause increased bleeding and bruising of the tissues.  If you were given crutches, continue to use them as instructed and do not resume weight bearing on the sore joint until instructed. Persistent pain and inability to use the sore joint as directed for more than 2 to 3 days are warning signs indicating that you should see a caregiver for a follow-up visit as soon as possible. Initially, a hairline fracture (break in bone) may not be evident on X-rays. Persistent pain and swelling indicate that further evaluation, non-weight bearing or use of the joint (use of crutches or slings as instructed), or further X-rays are indicated. X-rays may sometimes not show a small fracture until a week or 10 days later. Make a follow-up appointment with your own caregiver or one to whom we have referred you. A radiologist (specialist in reading X-rays) may read your X-rays. Make sure you know how you are to obtain your X-ray results. Do not assume everything is  normal if you do not hear from us. SEEK MEDICAL CARE IF: Bruising, swelling, or pain increases. SEEK IMMEDIATE MEDICAL CARE IF:   Your fingers or toes are numb or blue.  The pain is not responding to medications and continues to stay the same or get worse.  The pain in your joint becomes severe.  You develop a fever over 102 F (38.9 C).  It becomes impossible to move or use the joint. MAKE SURE YOU:     Understand these instructions.  Will watch your condition.  Will get help right away if you are not doing well or get worse. Document Released: 07/19/2005 Document Revised: 10/11/2011 Document Reviewed: 03/06/2008 [facility] Patient Information 2015 [employer] [place]. This information is not intended to replace advice given to you by your health care provider. Make sure you discuss any questions you have with your health care provider.

## 2014-09-02 NOTE — ED Notes (Signed)
Declined W/C at D/C and was escorted to lobby by RN. 

## 2014-12-12 ENCOUNTER — Encounter (HOSPITAL_COMMUNITY): Payer: Self-pay | Admitting: *Deleted

## 2014-12-12 ENCOUNTER — Emergency Department (HOSPITAL_COMMUNITY)
Admission: EM | Admit: 2014-12-12 | Discharge: 2014-12-12 | Disposition: A | Discharge status: Home / Self Care | Payer: Medicaid Other | Attending: Emergency Medicine | Admitting: Emergency Medicine

## 2014-12-12 ENCOUNTER — Emergency Department (HOSPITAL_COMMUNITY): Payer: Medicaid Other

## 2014-12-12 DIAGNOSIS — Z720 Tobacco use: Secondary | ICD-10-CM | POA: Diagnosis not present

## 2014-12-12 DIAGNOSIS — T383X5A Adverse effect of insulin and oral hypoglycemic [antidiabetic] drugs, initial encounter: Secondary | ICD-10-CM | POA: Insufficient documentation

## 2014-12-12 DIAGNOSIS — R079 Chest pain, unspecified: Secondary | ICD-10-CM | POA: Diagnosis present

## 2014-12-12 DIAGNOSIS — R5381 Other malaise: Secondary | ICD-10-CM | POA: Diagnosis not present

## 2014-12-12 DIAGNOSIS — R42 Dizziness and giddiness: Secondary | ICD-10-CM | POA: Insufficient documentation

## 2014-12-12 DIAGNOSIS — R0789 Other chest pain: Secondary | ICD-10-CM | POA: Diagnosis not present

## 2014-12-12 DIAGNOSIS — Z79899 Other long term (current) drug therapy: Secondary | ICD-10-CM | POA: Diagnosis not present

## 2014-12-12 DIAGNOSIS — Z862 Personal history of diseases of the blood and blood-forming organs and certain disorders involving the immune mechanism: Secondary | ICD-10-CM | POA: Insufficient documentation

## 2014-12-12 DIAGNOSIS — Z3202 Encounter for pregnancy test, result negative: Secondary | ICD-10-CM | POA: Insufficient documentation

## 2014-12-12 DIAGNOSIS — R5383 Other fatigue: Secondary | ICD-10-CM | POA: Diagnosis not present

## 2014-12-12 DIAGNOSIS — E119 Type 2 diabetes mellitus without complications: Secondary | ICD-10-CM | POA: Insufficient documentation

## 2014-12-12 DIAGNOSIS — I10 Essential (primary) hypertension: Secondary | ICD-10-CM | POA: Insufficient documentation

## 2014-12-12 DIAGNOSIS — Z789 Other specified health status: Secondary | ICD-10-CM

## 2014-12-12 DIAGNOSIS — R197 Diarrhea, unspecified: Secondary | ICD-10-CM | POA: Diagnosis not present

## 2014-12-12 DIAGNOSIS — O24112 Pre-existing diabetes mellitus, type 2, in pregnancy, second trimester: Secondary | ICD-10-CM

## 2014-12-12 DIAGNOSIS — K521 Toxic gastroenteritis and colitis: Secondary | ICD-10-CM

## 2014-12-12 LAB — URINALYSIS, ROUTINE W REFLEX MICROSCOPIC
Bilirubin Urine: NEGATIVE
Glucose, UA: NEGATIVE mg/dL
Hgb urine dipstick: NEGATIVE
Ketones, ur: 15 mg/dL — AB
Leukocytes, UA: NEGATIVE
Nitrite: NEGATIVE
Protein, ur: NEGATIVE mg/dL
Specific Gravity, Urine: 1.034 — ABNORMAL HIGH (ref 1.005–1.030)
Urobilinogen, UA: 1 mg/dL (ref 0.0–1.0)
pH: 6 (ref 5.0–8.0)

## 2014-12-12 LAB — BASIC METABOLIC PANEL
Anion gap: 13 (ref 5–15)
BUN: 8 mg/dL (ref 6–20)
CO2: 24 mmol/L (ref 22–32)
Calcium: 9.5 mg/dL (ref 8.9–10.3)
Chloride: 99 mmol/L — ABNORMAL LOW (ref 101–111)
Creatinine, Ser: 0.97 mg/dL (ref 0.44–1.00)
GFR calc Af Amer: >60 mL/min (ref >60)
GFR calc non Af Amer: >60 mL/min (ref >60)
Glucose, Bld: 127 mg/dL — ABNORMAL HIGH (ref 65–99)
Potassium: 3.5 mmol/L (ref 3.5–5.1)
Sodium: 136 mmol/L (ref 135–145)

## 2014-12-12 LAB — CBC
HCT: 42.6 % (ref 36.0–46.0)
Hemoglobin: 14.6 g/dL (ref 12.0–15.0)
MCH: 30.8 pg (ref 26.0–34.0)
MCHC: 34.3 g/dL (ref 30.0–36.0)
MCV: 89.9 fL (ref 78.0–100.0)
Platelets: 150 10*3/uL (ref 150–400)
RBC: 4.74 MIL/uL (ref 3.87–5.11)
RDW: 12.4 % (ref 11.5–15.5)
WBC: 8.7 10*3/uL (ref 4.0–10.5)

## 2014-12-12 LAB — I-STAT TROPONIN, ED
Troponin i, poc: 0 ng/mL (ref 0.00–0.08)
Troponin i, poc: 0 ng/mL (ref 0.00–0.08)

## 2014-12-12 LAB — CBG MONITORING, ED: Glucose-Capillary: 108 mg/dL — ABNORMAL HIGH (ref 65–99)

## 2014-12-12 LAB — POC URINE PREG, ED: Preg Test, Ur: NEGATIVE

## 2014-12-12 MED ORDER — ASPIRIN 325 MG PO TABS
325.0000 mg | ORAL_TABLET | Freq: Once | ORAL | Status: AC
  Administered 2014-12-12: 325 mg via ORAL
  Filled 2014-12-12: qty 1

## 2014-12-12 NOTE — Discharge Instructions (Signed)
Stay well hydrated. Avoid foods that will cause your sugar to go up (see attached forms). Continue taking your metformin! Use tylenol or motrin for any pain you experience. Use heat over your chest if it hurts, 20 minutes at a time every hour as needed. Follow up with your regular doctor in 5 days for recheck of symptoms. Return to the ER for changes or worsening symptoms.   Chest Wall Pain Chest wall pain is pain felt in or around the chest bones and muscles. It may take up to 6 weeks to get better. It may take longer if you are active. Chest wall pain can happen on its own. Other times, things like germs, injury, coughing, or exercise can cause the pain. HOME CARE   Avoid activities that make you tired or cause pain. Try not to use your chest, belly (abdominal), or side muscles. Do not use heavy weights.  Put ice on the sore area.  Put ice in a plastic bag.  Place a towel between your skin and the bag.  Leave the ice on for 15-20 minutes for the first 2 days.  Only take medicine as told by your doctor. GET HELP RIGHT AWAY IF:   You have more pain or are very uncomfortable.  You have a fever.  Your chest pain gets worse.  You have new problems.  You feel sick to your stomach (nauseous) or throw up (vomit).  You start to sweat or feel lightheaded.  You have a cough with mucus (phlegm).  You cough up blood. MAKE SURE YOU:   Understand these instructions.  Will watch your condition.  Will get help right away if you are not doing well or get worse. Document Released: 01/05/2008 Document Revised: 10/11/2011 Document Reviewed: 03/15/2011 [facility] Patient Information 2015 [place], [place]. This information is not intended to replace advice given to you by your health care provider. Make sure you discuss any questions you have with your health care provider.  Diabetes Mellitus and Food It is important for you to manage your blood sugar (glucose) level. Your blood glucose level  can be greatly affected by what you eat. Eating healthier foods in the appropriate amounts throughout the day at about the same time each day will help you control your blood glucose level. It can also help slow or prevent worsening of your diabetes mellitus. Healthy eating may even help you improve the level of your blood pressure and reach or maintain a healthy weight.  HOW CAN FOOD AFFECT ME? Carbohydrates Carbohydrates affect your blood glucose level more than any other type of food. Your dietitian will help you determine how many carbohydrates to eat at each meal and teach you how to count carbohydrates. Counting carbohydrates is important to keep your blood glucose at a healthy level, especially if you are using insulin or taking certain medicines for diabetes mellitus. Alcohol Alcohol can cause sudden decreases in blood glucose (hypoglycemia), especially if you use insulin or take certain medicines for diabetes mellitus. Hypoglycemia can be a life-threatening condition. Symptoms of hypoglycemia (sleepiness, dizziness, and disorientation) are similar to symptoms of having too much alcohol.  If your health care provider has given you approval to drink alcohol, do so in moderation and use the following guidelines:  Women should not have more than one drink per day, and men should not have more than two drinks per day. One drink is equal to:  12 oz of beer.  5 oz of wine.  1 oz of hard liquor.  Do not drink on an empty stomach.  Keep yourself hydrated. Have water, diet soda, or unsweetened iced tea.  Regular soda, juice, and other mixers might contain a lot of carbohydrates and should be counted. WHAT FOODS ARE NOT RECOMMENDED? As you make food choices, it is important to remember that all foods are not the same. Some foods have fewer nutrients per serving than other foods, even though they might have the same number of calories or carbohydrates. It is difficult to get your body what it  needs when you eat foods with fewer nutrients. Examples of foods that you should avoid that are high in calories and carbohydrates but low in nutrients include:  Trans fats (most processed foods list trans fats on the Nutrition Facts label).  Regular soda.  Juice.  Candy.  Sweets, such as cake, pie, doughnuts, and cookies.  Fried foods. WHAT FOODS CAN I EAT? Have nutrient-rich foods, which will nourish your body and keep you healthy. The food you should eat also will depend on several factors, including:  The calories you need.  The medicines you take.  Your weight.  Your blood glucose level.  Your blood pressure level.  Your cholesterol level. You also should eat a variety of foods, including:  Protein, such as meat, poultry, fish, tofu, nuts, and seeds (lean animal proteins are best).  Fruits.  Vegetables.  Dairy products, such as milk, cheese, and yogurt (low fat is best).  Breads, grains, pasta, cereal, rice, and beans.  Fats such as olive oil, trans fat-free margarine, canola oil, avocado, and olives. DOES EVERYONE WITH DIABETES MELLITUS HAVE THE SAME MEAL PLAN? Because every person with diabetes mellitus is different, there is not one meal plan that works for everyone. It is very important that you meet with a dietitian who will help you create a meal plan that is just right for you. Document Released: 04/15/2005 Document Revised: 07/24/2013 Document Reviewed: 06/15/2013 [facility] Patient Information 2015 [place], [place]. This information is not intended to replace advice given to you by your health care provider. Make sure you discuss any questions you have with your health care provider.  Diarrhea Diarrhea is watery poop (stool). It can make you feel weak, tired, thirsty, or give you a dry mouth (signs of dehydration). Watery poop is a sign of another problem, most often an infection. It often lasts 2-3 days. It can last longer if it is a sign of something  serious. Take care of yourself as told by your doctor. HOME CARE   Drink 1 cup (8 ounces) of fluid each time you have watery poop.  Do not drink the following fluids:  Those that contain simple sugars (fructose, glucose, galactose, lactose, sucrose, maltose).  Sports drinks.  Fruit juices.  Whole milk products.  Sodas.  Drinks with caffeine (coffee, tea, soda) or alcohol.  Oral rehydration solution may be used if the doctor says it is okay. You may make your own solution. Follow this recipe:   - teaspoon table salt.   teaspoon baking soda.   teaspoon salt substitute containing potassium chloride.  1 tablespoons sugar.  1 liter (34 ounces) of water.  Avoid the following foods:  High fiber foods, such as raw fruits and vegetables.  Nuts, seeds, and whole grain breads and cereals.   Those that are sweetened with sugar alcohols (xylitol, sorbitol, mannitol).  Try eating the following foods:  Starchy foods, such as rice, toast, pasta, low-sugar cereal, oatmeal, baked potatoes, crackers, and bagels.  Bananas.  Applesauce.  Eat probiotic-rich foods, such as yogurt and milk products that are fermented.  Wash your hands well after each time you have watery poop.  Only take medicine as told by your doctor.  Take a warm bath to help lessen burning or pain from having watery poop. GET HELP RIGHT AWAY IF:   You cannot drink fluids without throwing up (vomiting).  You keep throwing up.  You have blood in your poop, or your poop looks black and tarry.  You do not pee (urinate) in 6-8 hours, or there is only a small amount of very dark pee.  You have belly (abdominal) pain that gets worse or stays in the same spot (localizes).  You are weak, dizzy, confused, or light-headed.  You have a very bad headache.  Your watery poop gets worse or does not get better.  You have a fever or lasting symptoms for more than 2-3 days.  You have a fever and your symptoms  suddenly get worse. MAKE SURE YOU:   Understand these instructions.  Will watch your condition.  Will get help right away if you are not doing well or get worse. Document Released: 01/05/2008 Document Revised: 12/03/2013 Document Reviewed: 03/26/2012 [facility] Patient Information 2015 [place], [place]. This information is not intended to replace advice given to you by your health care provider. Make sure you discuss any questions you have with your health care provider.

## 2014-12-12 NOTE — ED Notes (Signed)
Pt denies pain or dizziness at present. Ambulatory to restroom without difficulty

## 2014-12-12 NOTE — ED Notes (Signed)
Pt reports intermittent chest pain, dizziness, weakness and possibly high blood sugar all starting today.

## 2014-12-12 NOTE — ED Notes (Signed)
Pt given graham crackers and peanut butter. Awaiting PA reassessment

## 2014-12-12 NOTE — ED Provider Notes (Signed)
CSN: [id number]     Arrival date & time 12/12/14  1752 History   First MD Initiated Contact with Patient 12/12/14 1833     Chief Complaint  Patient presents with  . Chest Pain  . Hyperglycemia  . Dizziness  . Diarrhea     (Consider location/radiation/quality/duration/timing/severity/associated sxs/prior Treatment) HPI Comments: Terri Bradford is a 34 y.o. female with a PMHx of HTN, thrombocytopenia, and recent diagnosis of DM2, who presents to the ED with complaints of sudden onset chest pain that began when she awoke around 11 AM, at rest. She reports that the pain has been intermittent throughout the day and is currently resolved. When it was occurring it was a 5/10 central pinching, intermittent, nonradiating, worse with lifting objects and movement, and with no medications tried prior to arrival. She reports that she felt lightheaded and she was having the chest pain, but states that this is resolved entirely. She reports that she feels generally fatigued and has had watery diarrhea since starting metformin last week. She has been somewhat compliant with her metformin although she admits that she has occasionally missed days. She states that she thinks her sugar is high today although she has not checked it at home because she does not have a meter. She denies any diaphoresis, syncope, headache, vision changes, numbness, tingling, weakness, jaw or neck pain, fevers, chills, shortness of breath, cough, wheezing, hemoptysis, leg swelling, claudication, PND, orthopnea, recent travel/surgery/immobilization, estrogen use, history of DVT/PE, back pain, flank pain, abdominal pain, nausea, vomiting, constipation, melena, hematochezia, obstipation, dysuria, hematuria, vaginal bleeding or discharge. Denies any family or personal history of cardiac disease. Smoker. LMP 11/28/14  Patient is a 34 y.o. female presenting with chest pain, hyperglycemia, dizziness, and diarrhea. The history is provided by the  patient. No language interpreter was used.  Chest Pain Pain location:  Substernal area Pain quality comment:  Pinching Pain radiates to:  Does not radiate Pain radiates to the back: no   Pain severity:  Mild Onset quality:  Sudden Duration:  8 hours Timing:  Intermittent Progression:  Resolved Chronicity:  New Context: at rest   Relieved by:  None tried Worsened by:  Movement Ineffective treatments:  None tried Associated symptoms: dizziness and fatigue (generalized)   Associated symptoms: no abdominal pain, no back pain, no claudication, no cough, no diaphoresis, no fever, no headache, no heartburn, no lower extremity edema, no nausea, no near-syncope, no numbness, no orthopnea, no PND, no shortness of breath, no syncope, not vomiting and no weakness   Risk factors: diabetes mellitus, hypertension and smoking   Risk factors: no birth control, no coronary artery disease, no immobilization, not obese, no prior DVT/PE and no surgery   Hyperglycemia Associated symptoms: chest pain (intermittent with lifting/movement, none ongoing), dizziness and fatigue (generalized)   Associated symptoms: no abdominal pain, no confusion, no diaphoresis, no dysuria, no fever, no nausea, no shortness of breath, no syncope, no vomiting and no weakness   Dizziness Associated symptoms: chest pain (intermittent with lifting/movement, none ongoing) and diarrhea (since starting metformin)   Associated symptoms: no headaches, no nausea, no shortness of breath, no syncope, no vomiting and no weakness   Diarrhea Associated symptoms: no abdominal pain, no arthralgias, no chills, no diaphoresis, no fever, no headaches, no myalgias and no vomiting     Past Medical History  Diagnosis Date  . Hypertension   . Thrombocytopenia, unspecified     Thrombocytopenia   Past Surgical History  Procedure Laterality Date  . Cesarean  section     History reviewed. No pertinent family history. History  Substance Use Topics   . Smoking status: Current Every Day Smoker -- 0.50 packs/day    Types: Cigarettes  . Smokeless tobacco: Not on file  . Alcohol Use: Yes     Comment: 2 weeks   OB History    Gravida Para Term Preterm AB TAB SAB Ectopic Multiple Living   3 3 3  0 0 0 0 0 0 2     Review of Systems  Constitutional: Positive for fatigue (generalized). Negative for fever, chills and diaphoresis.  Eyes: Negative for visual disturbance.  Respiratory: Negative for cough, shortness of breath and wheezing.   Cardiovascular: Positive for chest pain (intermittent with lifting/movement, none ongoing). Negative for orthopnea, claudication, leg swelling, syncope, PND and near-syncope.  Gastrointestinal: Positive for diarrhea (since starting metformin). Negative for heartburn, nausea, vomiting, abdominal pain and constipation.  Genitourinary: Negative for dysuria, hematuria, flank pain, vaginal bleeding, vaginal discharge and menstrual problem.  Musculoskeletal: Negative for myalgias, back pain, arthralgias and neck pain.  Skin: Negative for rash.  Allergic/Immunologic: Positive for immunocompromised state (diabetic).  Neurological: Positive for dizziness and light-headedness (now resolved, only when she had CP earlier). Negative for syncope, weakness, numbness and headaches.  Psychiatric/Behavioral: Negative for confusion.   10 Systems reviewed and are negative for acute change except as noted in the HPI.    Allergies  Review of patient's allergies indicates no known allergies.  Home Medications   Prior to Admission medications   Medication Sig Start Date End Date Taking? Authorizing Provider  ibuprofen (ADVIL,MOTRIN) 600 MG tablet Take 1 tablet (600 mg total) by mouth 3 (three) times daily. 07/18/14  Yes Oswaldo ConroyVictoria Creech, PA-C  lisinopril-hydrochlorothiazide (PRINZIDE,ZESTORETIC) 10-12.5 MG per tablet Take 1 tablet by mouth daily.   Yes Historical Provider, MD  metFORMIN (GLUCOPHAGE) 500 MG tablet Take 500 mg by  mouth 2 (two) times daily with a meal.   Yes Historical Provider, MD  naproxen (NAPROSYN) 500 MG tablet Take 1 tablet (500 mg total) by mouth 2 (two) times daily with a meal. Patient not taking: Reported on 07/18/2014 05/19/14   Hannah Muthersbaugh, PA-C   BP 132/75 mmHg  Pulse 68  Temp(Src) 98.3 F (36.8 C) (Oral)  Resp 18  Ht 5\' 2"  (1.575 m)  Wt 154 lb (69.854 kg)  BMI 28.16 kg/m2  SpO2 100% Physical Exam  Constitutional: She is oriented to person, place, and time. Vital signs are normal. She appears well-developed and well-nourished.  Non-toxic appearance. No distress.  Afebrile, nontoxic, NAD  HENT:  Head: Normocephalic and atraumatic.  Mouth/Throat: Oropharynx is clear and moist and mucous membranes are normal.  Eyes: Conjunctivae and EOM are normal. Pupils are equal, round, and reactive to light. Right eye exhibits no discharge. Left eye exhibits no discharge.  Neck: Normal range of motion. Neck supple.  Cardiovascular: Normal rate, regular rhythm, normal heart sounds and intact distal pulses.  Exam reveals no gallop and no friction rub.   No murmur heard. RRR, nl s1/s2, no m/r/g, distal pulses intact, no pedal edema   Pulmonary/Chest: Effort normal and breath sounds normal. No respiratory distress. She has no decreased breath sounds. She has no wheezes. She has no rhonchi. She has no rales. She exhibits no tenderness, no crepitus, no deformity and no retraction.  No chest wall TTP CTAB in all lung fields, no w/r/r, no hypoxia or increased WOB, speaking in full sentences, SpO2 100% on RA  Abdominal: Soft. Normal appearance and  bowel sounds are normal. She exhibits no distension. There is no tenderness. There is no rigidity, no rebound, no guarding, no CVA tenderness, no tenderness at McBurney's point and negative Murphy's sign.  Musculoskeletal: Normal range of motion.  MAE x4 Strength and sensation grossly intact Distal pulses intact No pedal edema, neg homan's bilaterally   Neurological: She is alert and oriented to person, place, and time. She has normal strength. No cranial nerve deficit or sensory deficit. Gait normal. GCS eye subscore is 4. GCS verbal subscore is 5. GCS motor subscore is 6.  No focal neuro deficits  Skin: Skin is warm, dry and intact. No rash noted.  Psychiatric: She has a normal mood and affect.  Nursing note and vitals reviewed.   ED Course  Procedures (including critical care time) Labs Review Labs Reviewed  BASIC METABOLIC PANEL - Abnormal; Notable for the following:    Chloride 99 (*)    Glucose, Bld 127 (*)    All other components within normal limits  URINALYSIS, ROUTINE W REFLEX MICROSCOPIC - Abnormal; Notable for the following:    Color, Urine AMBER (*)    Specific Gravity, Urine 1.034 (*)    Ketones, ur 15 (*)    All other components within normal limits  CBG MONITORING, ED - Abnormal; Notable for the following:    Glucose-Capillary 108 (*)    All other components within normal limits  CBC  I-STAT TROPOININ, ED  POC URINE PREG, ED  Rosezena Sensor, ED    Imaging Review Dg Chest 2 View  12/12/2014   CLINICAL DATA:  Chest pain  EXAM: CHEST  2 VIEW  COMPARISON:  05/19/2014  FINDINGS: The heart size and mediastinal contours are within normal limits. Both lungs are clear. The visualized skeletal structures are unremarkable.  IMPRESSION: No active cardiopulmonary disease.   Electronically Signed   By: Alcide Clever M.D.   On: 12/12/2014 19:55     EKG Interpretation   Date/Time:  Thursday Dec 12 2014 18:21:28 EDT Ventricular Rate:  78 PR Interval:  152 QRS Duration: 78 QT Interval:  378 QTC Calculation: 430 R Axis:   68 Text Interpretation:  Normal sinus rhythm with sinus arrhythmia  Non-specific ST-t changes Confirmed by Juleen China  MD, STEPHEN ([id number]) on  12/12/2014 8:44:56 PM      MDM   Final diagnoses:  Diabetes type 2, controlled  Chest pain, atypical  Malaise and fatigue  Medication intolerance  Diarrhea  due to drug    34 y.o. female here with intermittent CP that is currently resolved. Also felt dizzy earlier, none ongoing. States she feels generally fatigued, thinks her sugar is elevated, just started metformin 1wk ago, somewhat compliant with occasional missed doses. Has had diarrhea since starting metformin. On exam, no tenderness to chest or abd. No ongoing pain. No tachycardia or hypoxia, no SOB complaint, doubt PE. CP only with lifting, likely musculoskeletal. Will give ASA but will not give any other meds since no longer having the pain. Will check labs and CXR. EKG with some motion, no ischemic changes. Will get CBG now and U/A. Will reassess shortly.   9:17 PM CBG 108. CBC unremarkable. BMP WNL without anion gap. Trop neg. CXR clear. U/A with some dehydration but otherwise WNL. Upreg neg. Pt drinking fluids and tolerating them. No ongoing CP or symptoms since arrival here. Given that CP started <12hrs PTA and pt is diabetic, will get second trop at 3hr after first trop to ensure no bump occurs. Pt updated  and aware, agrees with plan. Will reassess shortly. In all likelihood, her symptoms are related to starting metformin.  10:56 PM No persisting symptoms still. Second trop neg. Discussed with pt that she needs to stay well hydrated as these symptoms were likely related to starting metformin and slight dehydration. Discussed tylenol/motrin for pain, and heat as needed to chest since it sounds like it was MSK pain. Long discussion regarding diet modifications for diabetes, and compliance with meds. Will have her f/up with PCP in 5 days for recheck of symptoms, but given low HEART score doubt need for admission at this time. I explained the diagnosis and have given explicit precautions to return to the ER including for any other new or worsening symptoms. The patient understands and accepts the medical plan as it's been dictated and I have answered their questions. Discharge instructions concerning  home care and prescriptions have been given. The patient is STABLE and is discharged to home in good condition.  BP 120/71 mmHg  Pulse 72  Temp(Src) 98.3 F (36.8 C) (Oral)  Resp 16  Ht 5\' 2"  (1.575 m)  Wt 154 lb (69.854 kg)  BMI 28.16 kg/m2  SpO2 100%  LMP 11/28/2014  Breastfeeding? No  Meds ordered this encounter  Medications  . aspirin tablet 325 mg    Sig:      Jaleen Finch Camprubi-Soms, PA-C 12/12/14 2258  Raeford RazorStephen Kohut, MD 12/13/14 1705

## 2014-12-12 NOTE — ED Notes (Signed)
CBG 108. 

## 2015-05-09 ENCOUNTER — Emergency Department (HOSPITAL_COMMUNITY)
Admission: EM | Admit: 2015-05-09 | Discharge: 2015-05-10 | Disposition: A | Discharge status: Home / Self Care | Payer: Medicaid Other | Attending: Emergency Medicine | Admitting: Emergency Medicine

## 2015-05-09 ENCOUNTER — Encounter (HOSPITAL_COMMUNITY): Payer: Self-pay | Admitting: Emergency Medicine

## 2015-05-09 ENCOUNTER — Emergency Department (HOSPITAL_COMMUNITY): Payer: Medicaid Other

## 2015-05-09 DIAGNOSIS — Z720 Tobacco use: Secondary | ICD-10-CM | POA: Insufficient documentation

## 2015-05-09 DIAGNOSIS — R0789 Other chest pain: Secondary | ICD-10-CM | POA: Insufficient documentation

## 2015-05-09 DIAGNOSIS — Z79899 Other long term (current) drug therapy: Secondary | ICD-10-CM | POA: Insufficient documentation

## 2015-05-09 DIAGNOSIS — R079 Chest pain, unspecified: Secondary | ICD-10-CM | POA: Diagnosis present

## 2015-05-09 DIAGNOSIS — Z3202 Encounter for pregnancy test, result negative: Secondary | ICD-10-CM | POA: Insufficient documentation

## 2015-05-09 DIAGNOSIS — R51 Headache: Secondary | ICD-10-CM | POA: Insufficient documentation

## 2015-05-09 DIAGNOSIS — Z862 Personal history of diseases of the blood and blood-forming organs and certain disorders involving the immune mechanism: Secondary | ICD-10-CM | POA: Diagnosis not present

## 2015-05-09 DIAGNOSIS — I10 Essential (primary) hypertension: Secondary | ICD-10-CM | POA: Diagnosis not present

## 2015-05-09 LAB — URINALYSIS, ROUTINE W REFLEX MICROSCOPIC
Bilirubin Urine: NEGATIVE
Glucose, UA: NEGATIVE mg/dL
Hgb urine dipstick: NEGATIVE
Ketones, ur: NEGATIVE mg/dL
Leukocytes, UA: NEGATIVE
Nitrite: NEGATIVE
Protein, ur: NEGATIVE mg/dL
Specific Gravity, Urine: 1.024 (ref 1.005–1.030)
Urobilinogen, UA: 1 mg/dL (ref 0.0–1.0)
pH: 8 (ref 5.0–8.0)

## 2015-05-09 LAB — BASIC METABOLIC PANEL
Anion gap: 8 (ref 5–15)
BUN: 11 mg/dL (ref 6–20)
CO2: 29 mmol/L (ref 22–32)
Calcium: 9.2 mg/dL (ref 8.9–10.3)
Chloride: 97 mmol/L — ABNORMAL LOW (ref 101–111)
Creatinine, Ser: 1.09 mg/dL — ABNORMAL HIGH (ref 0.44–1.00)
GFR calc Af Amer: >60 mL/min (ref >60)
GFR calc non Af Amer: >60 mL/min (ref >60)
Glucose, Bld: 120 mg/dL — ABNORMAL HIGH (ref 65–99)
Potassium: 3.8 mmol/L (ref 3.5–5.1)
Sodium: 134 mmol/L — ABNORMAL LOW (ref 135–145)

## 2015-05-09 LAB — CBC
HCT: 44.6 % (ref 36.0–46.0)
Hemoglobin: 15.2 g/dL — ABNORMAL HIGH (ref 12.0–15.0)
MCH: 31 pg (ref 26.0–34.0)
MCHC: 34.1 g/dL (ref 30.0–36.0)
MCV: 91 fL (ref 78.0–100.0)
Platelets: 122 10*3/uL — ABNORMAL LOW (ref 150–400)
RBC: 4.9 MIL/uL (ref 3.87–5.11)
RDW: 14.1 % (ref 11.5–15.5)
WBC: 8 10*3/uL (ref 4.0–10.5)

## 2015-05-09 LAB — I-STAT TROPONIN, ED: Troponin i, poc: 0 ng/mL (ref 0.00–0.08)

## 2015-05-09 LAB — POC URINE PREG, ED: Preg Test, Ur: NEGATIVE

## 2015-05-09 NOTE — ED Provider Notes (Signed)
CSN: [id number]     Arrival date & time 05/09/15  2233 History  By signing my name below, I, Emmanuella Mensah, attest that this documentation has been prepared under the direction and in the presence of Derwood Kaplan, MD. Electronically Signed: Angelene Giovanni, ED Scribe. 05/09/2015. 12:44 AM.   Chief Complaint  Patient presents with  . Chest Pain   The history is provided by the patient. No language interpreter was used.   HPI Comments: Terri Bradford is a 34 y.o. female who presents to the Emergency Department complaining of a 8/10 gradually worsening constant sharp HA concentrated at the top of her head onset today. She denies any neck pain, neck stiffness, numbness, tingling, or weakness on one side of the body. She denies waking up with the HA this am. She also denies taking any medication for the HA. She states that she is not currently on birth control. She denies any family hx of brain bleeds. She reports NKDA.   She also c/o intermittent mid CP onset 5 hours ago. She denies any fever, diaphoresis, nausea, or vomiting. She adds that currently she does not feel the pain. No alleviating factors noted. She reports that she currently smokes about 6-7 cigarettes in one day and denies any drug use. She denies any hx of PE or DVT. Pt states that she has diet-controlled DM. She adds that she has intermittent burning right arm pain onset a couple of months ago. She states that she puts Icy-Hot on the area with no relief.   Past Medical History  Diagnosis Date  . Hypertension   . Thrombocytopenia, unspecified (HCC)     Thrombocytopenia   Past Surgical History  Procedure Laterality Date  . Cesarean section     No family history on file. Social History  Substance Use Topics  . Smoking status: Current Every Day Smoker -- 0.00 packs/day    Types: Cigarettes  . Smokeless tobacco: None  . Alcohol Use: Yes   OB History    Gravida Para Term Preterm AB TAB SAB Ectopic Multiple Living   0 0 0 0 0 0 2     Review of Systems  Constitutional: Negative for fever, chills and diaphoresis.  Gastrointestinal: Negative for nausea and vomiting.  Musculoskeletal: Positive for arthralgias. Negative for neck pain and neck stiffness.  Neurological: Positive for headaches. Negative for weakness and numbness.      Allergies  Review of patient's allergies indicates no known allergies.  Home Medications   Prior to Admission medications   Medication Sig Start Date End Date Taking? Authorizing Provider  lisinopril-hydrochlorothiazide (PRINZIDE,ZESTORETIC) 10-12.5 MG per tablet Take 1 tablet by mouth daily.   Yes Historical Provider, MD  ibuprofen (ADVIL,MOTRIN) 600 MG tablet Take 1 tablet (600 mg total) by mouth every 6 (six) hours as needed. 05/10/15   Kaija Kovacevic Rhunette Croft, MD   BP 114/84 mmHg  Pulse 51  Temp(Src) 98.6 F (37 C) (Oral)  Resp 14  SpO2 100%  LMP 03/11/2015 (Approximate) Physical Exam  Constitutional: She is oriented to person, place, and time. She appears well-developed and well-nourished.  HENT:  Head: Normocephalic and atraumatic.  Eyes:  Pupils are 5, equal and reactive to light  Neck:  No meningitis   Cardiovascular: Normal rate and regular rhythm.   2+ radial and equal pulse No JVD  Pulmonary/Chest: Effort normal and breath sounds normal.  Abdominal: She exhibits no distension.  Musculoskeletal:  Upper extremity strength and sensory exam grossly normal and  equal.  Lower extremity: no pitting edema, no unilateral swelling.   Neurological: She is alert and oriented to person, place, and time.  CN 2-12 intact.   Skin: Skin is warm and dry.  Psychiatric: She has a normal mood and affect.  Nursing note and vitals reviewed.   ED Course  Procedures (including critical care time) DIAGNOSTIC STUDIES: Oxygen Saturation is 100% on RA, normal by my interpretation.    COORDINATION OF CARE: 12:25 AM- Pt advised of plan for treatment and pt agrees.    Labs  Review Labs Reviewed  BASIC METABOLIC PANEL - Abnormal; Notable for the following:    Sodium 134 (*)    Chloride 97 (*)    Glucose, Bld 120 (*)    Creatinine, Ser 1.09 (*)    All other components within normal limits  CBC - Abnormal; Notable for the following:    Hemoglobin 15.2 (*)    Platelets 122 (*)    All other components within normal limits  URINALYSIS, ROUTINE W REFLEX MICROSCOPIC (NOT AT [facility])  POC URINE PREG, ED  I-STAT TROPOININ, ED  I-STAT TROPOININ, ED    Imaging Review No results found. No att. providers found has personally reviewed and evaluated these images and lab results as part of his medical decision-making.   EKG Interpretation   Date/Time:  Friday May 09 2015 22:38:39 EDT Ventricular Rate:  60 PR Interval:  132 QRS Duration: 72 QT Interval:  414 QTC Calculation: 414 R Axis:   17 Text Interpretation:  Normal sinus rhythm Normal ECG No acute changes  Confirmed by Rhunette Croft, MD, Janey Genta [phone]) on 05/09/2015 11:17:35 PM      MDM   Final diagnoses:  Chest pain, atypical   I personally performed the services described in this documentation, which was scribed in my presence. The recorded information has been reviewed and is accurate.   34 y/o F with hx of DM and HTN comes in with cc of chest pain. Chest pain is central, unprovoked. Pt has no CAD hx.  History  Moderately suspicious 1  ECG  Normal 0   Age ? 34 years 0   Risk Factors  1 or 2 risk factors 1   Troponin  ? normal limit 0  HEAR score is 2.   Will get serial enzymes and recommend pcp f/u.  Pt has some headache and R sided upper arm complains. The arm pain has been intermittent for a long time. Headache is new, just today. The h/a is on vertex of the head. These symptoms are independent of the chest pain. No objective deficits. No clinical concerns for brain infection, bleeds or thrombosis - given the hx and exam.  Will give todadol and reglan.  Derwood Kaplan,  MD 05/24/15 [phone]

## 2015-05-09 NOTE — ED Notes (Signed)
Pt. reports intermittent mid chest pain and right upper arm pain and headache onset today , denies chest pain at arrival .

## 2015-05-10 LAB — I-STAT TROPONIN, ED: Troponin i, poc: 0 ng/mL (ref 0.00–0.08)

## 2015-05-10 MED ORDER — DIPHENHYDRAMINE HCL 50 MG/ML IJ SOLN
25.0000 mg | Freq: Once | INTRAMUSCULAR | Status: AC
  Administered 2015-05-10: 25 mg via INTRAVENOUS
  Filled 2015-05-10: qty 1

## 2015-05-10 MED ORDER — IBUPROFEN 600 MG PO TABS: 600.0000 mg | ORAL_TABLET | Freq: Four times a day (QID) | ORAL | Status: DC | PRN

## 2015-05-10 MED ORDER — SODIUM CHLORIDE 0.9 % IV BOLUS (SEPSIS)
1000.0000 mL | Freq: Once | INTRAVENOUS | Status: AC
  Administered 2015-05-10: 1000 mL via INTRAVENOUS

## 2015-05-10 MED ORDER — METOCLOPRAMIDE HCL 5 MG/ML IJ SOLN
10.0000 mg | Freq: Once | INTRAMUSCULAR | Status: AC
  Administered 2015-05-10: 10 mg via INTRAVENOUS
  Filled 2015-05-10: qty 2

## 2015-05-10 MED ORDER — KETOROLAC TROMETHAMINE 30 MG/ML IJ SOLN
30.0000 mg | Freq: Once | INTRAMUSCULAR | Status: AC
  Administered 2015-05-10: 30 mg via INTRAVENOUS
  Filled 2015-05-10: qty 1

## 2015-05-10 NOTE — ED Notes (Signed)
Pt left with all belongings and ambulated out of treatment area.  

## 2015-05-10 NOTE — Discharge Instructions (Signed)
We saw you in the ER for the chest pain/shortness of breath. All of our cardiac workup is normal, including labs, EKG and chest X-RAY are normal. We are not sure what is causing your discomfort, but we feel comfortable sending you home at this time. The workup in the ER is not complete, and you should follow up with your primary care doctor for further evaluation.   Chest Wall Pain Chest wall pain is pain in or around the bones and muscles of your chest. Sometimes, an injury causes this pain. Sometimes, the cause may not be known. This pain may take several weeks or longer to get better. HOME CARE INSTRUCTIONS  Pay attention to any changes in your symptoms. Take these actions to help with your pain:   Rest as told by your health care provider.   Avoid activities that cause pain. These include any activities that use your chest muscles or your abdominal and side muscles to lift heavy items.   If directed, apply ice to the painful area:  Put ice in a plastic bag.  Place a towel between your skin and the bag.  Leave the ice on for 20 minutes, 2-3 times per day.  Take over-the-counter and prescription medicines only as told by your health care provider.  Do not use tobacco products, including cigarettes, chewing tobacco, and e-cigarettes. If you need help quitting, ask your health care provider.  Keep all follow-up visits as told by your health care provider. This is important. SEEK MEDICAL CARE IF:  You have a fever.  Your chest pain becomes worse.  You have new symptoms. SEEK IMMEDIATE MEDICAL CARE IF:  You have nausea or vomiting.  You feel sweaty or light-headed.  You have a cough with phlegm (sputum) or you cough up blood.  You develop shortness of breath.   This information is not intended to replace advice given to you by your health care provider. Make sure you discuss any questions you have with your health care provider.   Document Released: 07/19/2005 Document  Revised: 04/09/2015 Document Reviewed: 10/14/2014 Elsevier Interactive Patient Education [employer]

## 2015-09-12 ENCOUNTER — Emergency Department (HOSPITAL_COMMUNITY)
Admission: EM | Admit: 2015-09-12 | Discharge: 2015-09-12 | Disposition: A | Discharge status: Home / Self Care | Payer: Medicaid Other | Attending: Emergency Medicine | Admitting: Emergency Medicine

## 2015-09-12 ENCOUNTER — Encounter (HOSPITAL_COMMUNITY): Payer: Self-pay | Admitting: Nurse Practitioner

## 2015-09-12 DIAGNOSIS — S161XXA Strain of muscle, fascia and tendon at neck level, initial encounter: Secondary | ICD-10-CM | POA: Diagnosis not present

## 2015-09-12 DIAGNOSIS — Z862 Personal history of diseases of the blood and blood-forming organs and certain disorders involving the immune mechanism: Secondary | ICD-10-CM | POA: Diagnosis not present

## 2015-09-12 DIAGNOSIS — Y9389 Activity, other specified: Secondary | ICD-10-CM | POA: Insufficient documentation

## 2015-09-12 DIAGNOSIS — Z79899 Other long term (current) drug therapy: Secondary | ICD-10-CM | POA: Diagnosis not present

## 2015-09-12 DIAGNOSIS — Y998 Other external cause status: Secondary | ICD-10-CM | POA: Insufficient documentation

## 2015-09-12 DIAGNOSIS — I10 Essential (primary) hypertension: Secondary | ICD-10-CM | POA: Insufficient documentation

## 2015-09-12 DIAGNOSIS — Y9289 Other specified places as the place of occurrence of the external cause: Secondary | ICD-10-CM | POA: Diagnosis not present

## 2015-09-12 DIAGNOSIS — X501XXA Overexertion from prolonged static or awkward postures, initial encounter: Secondary | ICD-10-CM | POA: Insufficient documentation

## 2015-09-12 DIAGNOSIS — T148XXA Other injury of unspecified body region, initial encounter: Secondary | ICD-10-CM

## 2015-09-12 DIAGNOSIS — F17210 Nicotine dependence, cigarettes, uncomplicated: Secondary | ICD-10-CM | POA: Insufficient documentation

## 2015-09-12 DIAGNOSIS — S199XXA Unspecified injury of neck, initial encounter: Secondary | ICD-10-CM | POA: Diagnosis present

## 2015-09-12 LAB — CBG MONITORING, ED: Glucose-Capillary: 117 mg/dL — ABNORMAL HIGH (ref 65–99)

## 2015-09-12 MED ORDER — METHOCARBAMOL 500 MG PO TABS: 500.0000 mg | ORAL_TABLET | Freq: Two times a day (BID) | ORAL | Status: DC

## 2015-09-12 NOTE — ED Notes (Signed)
Pt. Directed to to the [facility] after discharge.

## 2015-09-12 NOTE — Discharge Instructions (Signed)

## 2015-09-12 NOTE — ED Notes (Signed)
CBG 117 

## 2015-09-12 NOTE — ED Provider Notes (Signed)
CSN: [id number]     Arrival date & time 09/12/15  1450 History  By signing my name below, I, Tanda Rockers, attest that this documentation has been prepared under the direction and in the presence of Roxy Horseman, PA-C. Electronically Signed: Tanda Rockers, ED Scribe. 09/12/2015. 3:41 PM.   Chief Complaint  Patient presents with  . Back Pain   The history is provided by the patient. No language interpreter was used.     HPI Comments: Terri Bradford is a 35 y.o. female who presents to the Emergency Department complaining of sudden onset, constant, moderate, left lateral neck pain that began earlier today. Pt reports that she was picking up her child when she began feeling immediate pain to the area. The pain radiates into the right side of her face as well. Pt mentions that she was having mild left arm pain that has since resolved on its own. She has not taken anything for the pain. Denies numbness, weakness, tingling, or any other associated symptoms.   Past Medical History  Diagnosis Date  . Hypertension   . Thrombocytopenia, unspecified (HCC)     Thrombocytopenia   Past Surgical History  Procedure Laterality Date  . Cesarean section     History reviewed. No pertinent family history. Social History  Substance Use Topics  . Smoking status: Current Every Day Smoker -- 0.00 packs/day    Types: Cigarettes  . Smokeless tobacco: None  . Alcohol Use: Yes   OB History    Gravida Para Term Preterm AB TAB SAB Ectopic Multiple Living   0 0 0 0 0 0 2     Review of Systems  Musculoskeletal: Positive for arthralgias (left arm ) and neck pain.  Neurological: Negative for weakness and numbness.   Allergies  Review of patient's allergies indicates no known allergies.  Home Medications   Prior to Admission medications   Medication Sig Start Date End Date Taking? Authorizing Provider  ibuprofen (ADVIL,MOTRIN) 600 MG tablet Take 1 tablet (600 mg total) by mouth every 6 (six)  hours as needed. 05/10/15   Derwood Kaplan, MD  lisinopril-hydrochlorothiazide (PRINZIDE,ZESTORETIC) 10-12.5 MG per tablet Take 1 tablet by mouth daily.    Historical Provider, MD   BP 126/83 mmHg  Pulse 62  Temp(Src) 98.3 F (36.8 C) (Oral)  Resp 17  SpO2 100%  LMP 09/11/2015   Physical Exam  Constitutional: She is oriented to person, place, and time. She appears well-developed and well-nourished. No distress.  HENT:  Head: Normocephalic and atraumatic.  Eyes: Conjunctivae and EOM are normal. Right eye exhibits no discharge. Left eye exhibits no discharge. No scleral icterus.  Neck: Normal range of motion. Neck supple. No tracheal deviation present.  Cardiovascular: Normal rate, regular rhythm and normal heart sounds.  Exam reveals no gallop and no friction rub.   No murmur heard. Pulmonary/Chest: Effort normal and breath sounds normal. No respiratory distress. She has no wheezes.  Abdominal: Soft. She exhibits no distension. There is no tenderness.  Musculoskeletal: Normal range of motion.  Left cervical paraspinal and SCM muscles tender to palpation, no bony tenderness, step-offs, or gross abnormality or deformity of spine, patient is able to ambulate, moves all extremities  Bilateral great toe extension intact Bilateral plantar/dorsiflexion intact  Neurological: She is alert and oriented to person, place, and time.  Sensation and strength intact bilaterally   Skin: Skin is warm. She is not diaphoretic.  Psychiatric: She has a normal mood and affect. Her behavior is  normal. Judgment and thought content normal.  Nursing note and vitals reviewed.   ED Course  Procedures (including critical care time)  DIAGNOSTIC STUDIES: Oxygen Saturation is 100% on RA, normal by my interpretation.    COORDINATION OF CARE: 3:39 PM-Discussed treatment plan which includes Rx muscle relaxant with pt at bedside and pt agreed to plan.   Labs Review Labs Reviewed  CBG MONITORING, ED - Abnormal;  Notable for the following:    Glucose-Capillary 117 (*)    All other components within normal limits    MDM   Final diagnoses:  Muscle strain   Patient with neck muscle pain.  No neurological deficits and normal neuro exam.  Patient is ambulatory.  No loss of bowel or bladder control.  Doubt cauda equina.  Denies fever,  doubt epidural abscess or other lesion. Recommend back exercises, stretching, RICE, and will treat with a short course of robaxin.  Encouraged the patient that there could be a need for additional workup and/or imaging such as MRI, if the symptoms do not resolve. Patient advised that if the back pain does not resolve, or radiates, this could progress to more serious conditions and is encouraged to follow-up with PCP or orthopedics within 2 weeks.    I personally performed the services described in this documentation, which was scribed in my presence. The recorded information has been reviewed and is accurate.        Roxy Horseman, PA-C 09/12/15 1555  Marily Memos, MD 09/12/15 1556

## 2015-09-12 NOTE — ED Notes (Signed)
Pt reports she picked up a child this afternoon and has had neck, upper back pain, radiating into her left ear since. Nothing relieves the pain. Movement increases the pain. She also requests her blood sugar checked

## 2015-12-26 ENCOUNTER — Emergency Department (HOSPITAL_COMMUNITY): Payer: Medicaid Other

## 2015-12-26 ENCOUNTER — Emergency Department (HOSPITAL_COMMUNITY)
Admission: EM | Admit: 2015-12-26 | Discharge: 2015-12-27 | Disposition: A | Discharge status: Home / Self Care | Payer: Medicaid Other | Attending: Emergency Medicine | Admitting: Emergency Medicine

## 2015-12-26 ENCOUNTER — Encounter (HOSPITAL_COMMUNITY): Payer: Self-pay | Admitting: *Deleted

## 2015-12-26 DIAGNOSIS — F17210 Nicotine dependence, cigarettes, uncomplicated: Secondary | ICD-10-CM | POA: Insufficient documentation

## 2015-12-26 DIAGNOSIS — I10 Essential (primary) hypertension: Secondary | ICD-10-CM | POA: Diagnosis not present

## 2015-12-26 DIAGNOSIS — Z862 Personal history of diseases of the blood and blood-forming organs and certain disorders involving the immune mechanism: Secondary | ICD-10-CM | POA: Diagnosis not present

## 2015-12-26 DIAGNOSIS — R0789 Other chest pain: Secondary | ICD-10-CM

## 2015-12-26 DIAGNOSIS — Z3202 Encounter for pregnancy test, result negative: Secondary | ICD-10-CM | POA: Insufficient documentation

## 2015-12-26 DIAGNOSIS — K219 Gastro-esophageal reflux disease without esophagitis: Secondary | ICD-10-CM | POA: Diagnosis not present

## 2015-12-26 DIAGNOSIS — Z79899 Other long term (current) drug therapy: Secondary | ICD-10-CM | POA: Insufficient documentation

## 2015-12-26 DIAGNOSIS — R079 Chest pain, unspecified: Secondary | ICD-10-CM | POA: Diagnosis present

## 2015-12-26 LAB — URINALYSIS, ROUTINE W REFLEX MICROSCOPIC
Bilirubin Urine: NEGATIVE
Glucose, UA: NEGATIVE mg/dL
Hgb urine dipstick: NEGATIVE
Ketones, ur: 15 mg/dL — AB
Leukocytes, UA: NEGATIVE
Nitrite: POSITIVE — AB
Protein, ur: NEGATIVE mg/dL
Specific Gravity, Urine: 1.019 (ref 1.005–1.030)
pH: 5.5 (ref 5.0–8.0)

## 2015-12-26 LAB — URINE MICROSCOPIC-ADD ON
RBC / HPF: NONE SEEN RBC/hpf (ref 0–5)
WBC, UA: NONE SEEN WBC/hpf (ref 0–5)

## 2015-12-26 LAB — POC URINE PREG, ED: Preg Test, Ur: NEGATIVE

## 2015-12-26 NOTE — ED Notes (Signed)
The pt is c/o mid-chest pain since last pm no other symptoms  She has had these chest pains in the past was seen and dxd with indigestion  No cardiac history  lmp  2 months ago

## 2015-12-27 LAB — I-STAT TROPONIN, ED: Troponin i, poc: 0 ng/mL (ref 0.00–0.08)

## 2015-12-27 MED ORDER — OMEPRAZOLE 20 MG PO CPDR: 20.0000 mg | DELAYED_RELEASE_CAPSULE | Freq: Every day | ORAL | Status: DC

## 2015-12-27 MED ORDER — GI COCKTAIL ~~LOC~~
30.0000 mL | Freq: Once | ORAL | Status: AC
  Administered 2015-12-27: 30 mL via ORAL
  Filled 2015-12-27: qty 30

## 2015-12-27 NOTE — ED Notes (Signed)
MD at bedside. 

## 2015-12-27 NOTE — Discharge Instructions (Signed)
We saw you in the ER for the chest pain/shortness of breath. All of our cardiac workup is normal, including labs, EKG and chest X-RAY are normal.  We are not sure what is causing your discomfort, but we feel comfortable sending you home at this time. We think this is gastritis. The workup in the ER is not complete, and you should follow up with your primary care doctor for further evaluation.  Please return to the ER if you have worsening chest pain, shortness of breath, pain radiating to your jaw, shoulder, or back, sweats or fainting. Otherwise see the primary care doctor as requested.   Gastroesophageal Reflux Disease, Adult Normally, food travels down the esophagus and stays in the stomach to be digested. If a person has gastroesophageal reflux disease (GERD), food and stomach acid move back up into the esophagus. When this happens, the esophagus becomes sore and swollen (inflamed). Over time, GERD can make small holes (ulcers) in the lining of the esophagus. HOME CARE Diet  Follow a diet as told by your doctor. You may need to avoid foods and drinks such as:  Coffee and tea (with or without caffeine).  Drinks that contain alcohol.  Energy drinks and sports drinks.  Carbonated drinks or sodas.  Chocolate and cocoa.  Peppermint and mint flavorings.  Garlic and onions.  Horseradish.  Spicy and acidic foods, such as peppers, chili powder, curry powder, vinegar, hot sauces, and BBQ sauce.  Citrus fruit juices and citrus fruits, such as oranges, lemons, and limes.  Tomato-based foods, such as red sauce, chili, salsa, and pizza with red sauce.  Fried and fatty foods, such as donuts, french fries, potato chips, and high-fat dressings.  High-fat meats, such as hot dogs, rib eye steak, sausage, ham, and bacon.  High-fat dairy items, such as whole milk, butter, and cream cheese.  Eat small meals often. Avoid eating large meals.  Avoid drinking large amounts of liquid with your  meals.  Avoid eating meals during the 2-3 hours before bedtime.  Avoid lying down right after you eat.  Do not exercise right after you eat. General Instructions  Pay attention to any changes in your symptoms.  Take over-the-counter and prescription medicines only as told by your doctor. Do not take aspirin, ibuprofen, or other NSAIDs unless your doctor says it is okay.  Do not use any tobacco products, including cigarettes, chewing tobacco, and e-cigarettes. If you need help quitting, ask your doctor.  Wear loose clothes. Do not wear anything tight around your waist.  Raise (elevate) the head of your bed about 6 inches (15 cm).  Try to lower your stress. If you need help doing this, ask your doctor.  If you are overweight, lose an amount of weight that is healthy for you. Ask your doctor about a safe weight loss goal.  Keep all follow-up visits as told by your doctor. This is important. GET HELP IF:  You have new symptoms.  You lose weight and you do not know why it is happening.  You have trouble swallowing, or it hurts to swallow.  You have wheezing or a cough that keeps happening.  Your symptoms do not get better with treatment.  You have a hoarse voice. GET HELP RIGHT AWAY IF:  You have pain in your arms, neck, jaw, teeth, or back.  You feel sweaty, dizzy, or light-headed.  You have chest pain or shortness of breath.  You throw up (vomit) and your throw up looks like blood or  coffee grounds.  You pass out (faint).  Your poop (stool) is bloody or black.  You cannot swallow, drink, or eat.   This information is not intended to replace advice given to you by your health care provider. Make sure you discuss any questions you have with your health care provider.   Document Released: 01/05/2008 Document Revised: 04/09/2015 Document Reviewed: 11/13/2014 Elsevier Interactive Patient Education 2016 [employer].  Food Choices for Gastroesophageal Reflux  Disease, Adult When you have gastroesophageal reflux disease (GERD), the foods you eat and your eating habits are very important. Choosing the right foods can help ease your discomfort.  WHAT GUIDELINES DO I NEED TO FOLLOW?   Choose fruits, vegetables, whole grains, and low-fat dairy products.   Choose low-fat meat, fish, and poultry.  Limit fats such as oils, salad dressings, butter, nuts, and avocado.   Keep a food diary. This helps you identify foods that cause symptoms.   Avoid foods that cause symptoms. These may be different for everyone.   Eat small meals often instead of 3 large meals a day.   Eat your meals slowly, in a place where you are relaxed.   Limit fried foods.   Cook foods using methods other than frying.   Avoid drinking alcohol.   Avoid drinking large amounts of liquids with your meals.   Avoid bending over or lying down until 2-3 hours after eating.  WHAT FOODS ARE NOT RECOMMENDED?  These are some foods and drinks that may make your symptoms worse: Vegetables Tomatoes. Tomato juice. Tomato and spaghetti sauce. Chili peppers. Onion and garlic. Horseradish. Fruits Oranges, grapefruit, and lemon (fruit and juice). Meats High-fat meats, fish, and poultry. This includes hot dogs, ribs, ham, sausage, salami, and bacon. Dairy Whole milk and chocolate milk. Sour cream. Cream. Butter. Ice cream. Cream cheese.  Drinks Coffee and tea. Bubbly (carbonated) drinks or energy drinks. Condiments Hot sauce. Barbecue sauce.  Sweets/Desserts Chocolate and cocoa. Donuts. Peppermint and spearmint. Fats and Oils High-fat foods. This includes [place] fries and potato chips. Other Vinegar. Strong spices. This includes black pepper, white pepper, red pepper, cayenne, curry powder, cloves, ginger, and chili powder. The items listed above may not be a complete list of foods and drinks to avoid. Contact your dietitian for more information.   This information is not  intended to replace advice given to you by your health care provider. Make sure you discuss any questions you have with your health care provider.   Document Released: 01/18/2012 Document Revised: 08/09/2014 Document Reviewed: 05/23/2013 Elsevier Interactive Patient Education 2016 [employer] Inc[employer]  Nonspecific Chest Pain  Chest pain can be caused by many different conditions. There is always a chance that your pain could be related to something serious, such as a heart attack or a blood clot in your lungs. Chest pain can also be caused by conditions that are not life-threatening. If you have chest pain, it is very important to follow up with your health care provider. CAUSES  Chest pain can be caused by:  Heartburn.  Pneumonia or bronchitis.  Anxiety or stress.  Inflammation around your heart (pericarditis) or lung (pleuritis or pleurisy).  A blood clot in your lung.  A collapsed lung (pneumothorax). It can develop suddenly on its own (spontaneous pneumothorax) or from trauma to the chest.  Shingles infection (varicella-zoster virus).  Heart attack.  Damage to the bones, muscles, and cartilage that make up your chest wall. This can include:  Bruised bones due to injury.  Strained  muscles or cartilage due to frequent or repeated coughing or overwork.  Fracture to one or more ribs.  Sore cartilage due to inflammation (costochondritis). RISK FACTORS  Risk factors for chest pain may include:  Activities that increase your risk for trauma or injury to your chest.  Respiratory infections or conditions that cause frequent coughing.  Medical conditions or overeating that can cause heartburn.  Heart disease or family history of heart disease.  Conditions or health behaviors that increase your risk of developing a blood clot.  Having had chicken pox (varicella zoster). SIGNS AND SYMPTOMS Chest pain can feel like:  Burning or tingling on the surface of your chest or deep in  your chest.  Crushing, pressure, aching, or squeezing pain.  Dull or sharp pain that is worse when you move, cough, or take a deep breath.  Pain that is also felt in your back, neck, shoulder, or arm, or pain that spreads to any of these areas. Your chest pain may come and go, or it may stay constant. DIAGNOSIS Lab tests or other studies may be needed to find the cause of your pain. Your health care provider may have you take a test called an ambulatory ECG (electrocardiogram). An ECG records your heartbeat patterns at the time the test is performed. You may also have other tests, such as:  Transthoracic echocardiogram (TTE). During echocardiography, sound waves are used to create a picture of all of the heart structures and to look at how blood flows through your heart.  Transesophageal echocardiogram (TEE).This is a more advanced imaging test that obtains images from inside your body. It allows your health care provider to see your heart in finer detail.  Cardiac monitoring. This allows your health care provider to monitor your heart rate and rhythm in real time.  Holter monitor. This is a portable device that records your heartbeat and can help to diagnose abnormal heartbeats. It allows your health care provider to track your heart activity for several days, if needed.  Stress tests. These can be done through exercise or by taking medicine that makes your heart beat more quickly.  Blood tests.  Imaging tests. TREATMENT  Your treatment depends on what is causing your chest pain. Treatment may include:  Medicines. These may include:  Acid blockers for heartburn.  Anti-inflammatory medicine.  Pain medicine for inflammatory conditions.  Antibiotic medicine, if an infection is present.  Medicines to dissolve blood clots.  Medicines to treat coronary artery disease.  Supportive care for conditions that do not require medicines. This may include:  Resting.  Applying heat or  cold packs to injured areas.  Limiting activities until pain decreases. HOME CARE INSTRUCTIONS  If you were prescribed an antibiotic medicine, finish it all even if you start to feel better.  Avoid any activities that bring on chest pain.  Do not use any tobacco products, including cigarettes, chewing tobacco, or electronic cigarettes. If you need help quitting, ask your health care provider.  Do not drink alcohol.  Take medicines only as directed by your health care provider.  Keep all follow-up visits as directed by your health care provider. This is important. This includes any further testing if your chest pain does not go away.  If heartburn is the cause for your chest pain, you may be told to keep your head raised (elevated) while sleeping. This reduces the chance that acid will go from your stomach into your esophagus.  Make lifestyle changes as directed by your health care  provider. These may include:  Getting regular exercise. Ask your health care provider to suggest some activities that are safe for you.  Eating a heart-healthy diet. A registered dietitian can help you to learn healthy eating options.  Maintaining a healthy weight.  Managing diabetes, if necessary.  Reducing stress. SEEK MEDICAL CARE IF:  Your chest pain does not go away after treatment.  You have a rash with blisters on your chest.  You have a fever. SEEK IMMEDIATE MEDICAL CARE IF:   Your chest pain is worse.  You have an increasing cough, or you cough up blood.  You have severe abdominal pain.  You have severe weakness.  You faint.  You have chills.  You have sudden, unexplained chest discomfort.  You have sudden, unexplained discomfort in your arms, back, neck, or jaw.  You have shortness of breath at any time.  You suddenly start to sweat, or your skin gets clammy.  You feel nauseous or you vomit.  You suddenly feel light-headed or dizzy.  Your heart begins to beat quickly,  or it feels like it is skipping beats. These symptoms may represent a serious problem that is an emergency. Do not wait to see if the symptoms will go away. Get medical help right away. Call your local emergency services (911 in the [place] Do not drive yourself to the hospital.   This information is not intended to replace advice given to you by your health care provider. Make sure you discuss any questions you have with your health care provider.   Document Released: 04/28/2005 Document Revised: 08/09/2014 Document Reviewed: 02/22/2014 Elsevier Interactive Patient Education [employer]

## 2015-12-27 NOTE — ED Notes (Signed)
Pt verbalized understanding of discharge instructions.

## 2015-12-27 NOTE — ED Provider Notes (Signed)
CSN: [id number]     Arrival date & time 12/26/15  2046 History  By signing my name below, I, Ronney Lion, attest that this documentation has been prepared under the direction and in the presence of Derwood Kaplan, MD. Electronically Signed: Ronney Lion, ED Scribe. 12/27/2015. 1:17 AM.   Chief Complaint  Patient presents with  . Chest Pain   The history is provided by the patient. No language interpreter was used.    HPI Comments: Terri Bradford is a 35 y.o. female with a history of hypertension and thrombocytopenia, who presents to the Emergency Department complaining of intermittent, pressure-like, non-radiating central chest pain that began yesterday night, over 24 hours ago. She states her pain is nonexertional and is not associated with movement. The pain does not radiate to her back, per patient. Patient states she had similar chest pain in the past and was diagnosed with indigestion. She does state she has been eating a greater than usual amount of spicy food at [facility], daily, over the past 2 weeks. Patient states belching sometimes alleviates her pain. She states she has taken Tums, with mild, transient relief. Patient reports a history of well-controlled, diet-controlled DM and hypertension, treated with lisinopril. She denies a history of any other chronic medical conditions. She reports she is a 0.5 PPD smoker and socially consumes EtOH. She denies a family history of CAD <55. She also denies a history of DVT/PE or estrogen therapy. She denies dizziness, nausea, vomiting, SOB.   Past Medical History  Diagnosis Date  . Hypertension   . Thrombocytopenia, unspecified (HCC)     Thrombocytopenia   Past Surgical History  Procedure Laterality Date  . Cesarean section     No family history on file. Social History  Substance Use Topics  . Smoking status: Current Every Day Smoker -- 0.00 packs/day    Types: Cigarettes  . Smokeless tobacco: None  . Alcohol Use: Yes   OB History     Gravida Para Term Preterm AB TAB SAB Ectopic Multiple Living   0 0 0 0 0 0 2     Review of Systems  Respiratory: Negative for shortness of breath.   Cardiovascular: Positive for chest pain.  Gastrointestinal: Negative for nausea and vomiting.  Neurological: Negative for dizziness.  All other systems reviewed and are negative.  Allergies  Review of patient's allergies indicates no known allergies.  Home Medications   Prior to Admission medications   Medication Sig Start Date End Date Taking? Authorizing Provider  lisinopril-hydrochlorothiazide (PRINZIDE,ZESTORETIC) 10-12.5 MG per tablet Take 1 tablet by mouth daily.   Yes Historical Provider, MD   BP 108/79 mmHg  Pulse 56  Temp(Src) 98.5 F (36.9 C) (Oral)  Resp 12  SpO2 100%  LMP 10/26/2015 Physical Exam  Constitutional: She is oriented to person, place, and time. She appears well-developed and well-nourished. No distress.  HENT:  Head: Normocephalic and atraumatic.  Eyes: Conjunctivae and EOM are normal.  Neck: Neck supple. No tracheal deviation present.  Cardiovascular: Normal rate, regular rhythm and intact distal pulses.   RRR. 2+ and equal radial pulses bilaterally.  Pulmonary/Chest: Effort normal. No respiratory distress.  Lungs are clear to auscultation anteriorly.   Musculoskeletal: Normal range of motion.  Neurological: She is alert and oriented to person, place, and time.  Skin: Skin is warm and dry.  Psychiatric: She has a normal mood and affect. Her behavior is normal.  Nursing note and vitals reviewed.   ED Course  Procedures (including critical care time)  DIAGNOSTIC STUDIES: Oxygen Saturation is 100% on RA, normal by my interpretation.    COORDINATION OF CARE: 12:57 AMn - Pt made aware of unremarkable EKG. Discussed treatment plan with pt at bedside which includes beginning daily Rx omeprazole and reducing spicy foods in her diet. Pt verbalized understanding and agreed to plan.   Labs  Review Labs Reviewed  URINALYSIS, ROUTINE W REFLEX MICROSCOPIC (NOT AT [facility]) - Abnormal; Notable for the following:    APPearance CLOUDY (*)    Ketones, ur 15 (*)    Nitrite POSITIVE (*)    All other components within normal limits  URINE MICROSCOPIC-ADD ON - Abnormal; Notable for the following:    Squamous Epithelial / LPF 0-5 (*)    Bacteria, UA RARE (*)    All other components within normal limits  POC URINE PREG, ED  Rosezena SensorI-STAT TROPOININ, ED    Imaging Review Dg Chest 2 View  12/26/2015  CLINICAL DATA:  Acute onset of mid chest pain.  Initial encounter. EXAM: CHEST  2 VIEW COMPARISON:  Chest radiograph performed 05/09/2015 FINDINGS: The lungs are well-aerated and clear. There is no evidence of focal opacification, pleural effusion or pneumothorax. The heart is normal in size; the mediastinal contour is within normal limits. No acute osseous abnormalities are seen. IMPRESSION: No acute cardiopulmonary process seen. Electronically Signed   By: Roanna RaiderJeffery  Chang M.D.   On: 12/26/2015 21:37   I have personally reviewed and evaluated these images and lab results as part of my medical decision-making.   EKG Interpretation   Date/Time:  Friday Dec 26 2015 20:50:17 EDT Ventricular Rate:  61 PR Interval:  136 QRS Duration: 76 QT Interval:  402 QTC Calculation: 404 R Axis:   37 Text Interpretation:  Normal sinus rhythm Normal ECG No acute changes  Confirmed by Rhunette CroftNANAVATI, MD, Janey GentaANKIT [phone]([id number]) on 12/27/2015 12:11:57 AM      MDM   Final diagnoses:  Gastroesophageal reflux disease without esophagitis  Atypical chest pain    I personally performed the services described in this documentation, which was scribed in my presence. The recorded information has been reviewed and is accurate.  Pt comes in with cc of chest pain. Chest discomfort is midsternal and is non radiating, fairly constant -similar to her heart burn pain. No response to tums. Pt is pre-diabetic and smoker with HTN. She has no  premature CAD or drug use.  EKG is reassuring Will get a trop now - if normal, will d.c with PPI and strict return precautions. Pt has been in the ER with constant pain for 4 hours -so we will not get a repeat, the first trop essentially will be a 3 hour trop. Strict ER return precautions have been discussed, and patient is agreeing with the plan and is comfortable with the workup done and the recommendations from the ER.    Derwood KaplanAnkit Danah Reinecke, MD 12/27/15 [phone]

## 2016-04-06 ENCOUNTER — Emergency Department (HOSPITAL_COMMUNITY)
Admission: EM | Admit: 2016-04-06 | Discharge: 2016-04-06 | Disposition: A | Discharge status: Home / Self Care | Payer: Medicaid Other | Attending: Emergency Medicine | Admitting: Emergency Medicine

## 2016-04-06 ENCOUNTER — Encounter (HOSPITAL_COMMUNITY): Payer: Self-pay | Admitting: *Deleted

## 2016-04-06 DIAGNOSIS — F17210 Nicotine dependence, cigarettes, uncomplicated: Secondary | ICD-10-CM | POA: Insufficient documentation

## 2016-04-06 DIAGNOSIS — R51 Headache: Secondary | ICD-10-CM | POA: Insufficient documentation

## 2016-04-06 DIAGNOSIS — E119 Type 2 diabetes mellitus without complications: Secondary | ICD-10-CM | POA: Insufficient documentation

## 2016-04-06 DIAGNOSIS — I10 Essential (primary) hypertension: Secondary | ICD-10-CM | POA: Diagnosis not present

## 2016-04-06 DIAGNOSIS — R519 Headache, unspecified: Secondary | ICD-10-CM

## 2016-04-06 LAB — URINALYSIS, ROUTINE W REFLEX MICROSCOPIC
Bilirubin Urine: NEGATIVE
Glucose, UA: NEGATIVE mg/dL
Hgb urine dipstick: NEGATIVE
Ketones, ur: NEGATIVE mg/dL
Leukocytes, UA: NEGATIVE
Nitrite: NEGATIVE
Protein, ur: NEGATIVE mg/dL
Specific Gravity, Urine: 1.026 (ref 1.005–1.030)
pH: 6.5 (ref 5.0–8.0)

## 2016-04-06 LAB — I-STAT BETA HCG BLOOD, ED (MC, WL, AP ONLY): I-stat hCG, quantitative: <5 m[IU]/mL (ref <5)

## 2016-04-06 LAB — I-STAT CHEM 8, ED
BUN: 11 mg/dL (ref 6–20)
Calcium, Ion: 1.19 mmol/L (ref 1.15–1.40)
Chloride: 103 mmol/L (ref 101–111)
Creatinine, Ser: 0.9 mg/dL (ref 0.44–1.00)
Glucose, Bld: 90 mg/dL (ref 65–99)
HCT: 47 % — ABNORMAL HIGH (ref 36.0–46.0)
Hemoglobin: 16 g/dL — ABNORMAL HIGH (ref 12.0–15.0)
Potassium: 4 mmol/L (ref 3.5–5.1)
Sodium: 138 mmol/L (ref 135–145)
TCO2: 24 mmol/L (ref 0–100)

## 2016-04-06 MED ORDER — BUTALBITAL-APAP-CAFFEINE 50-325-40 MG PO TABS: 1.0000 | ORAL_TABLET | Freq: Three times a day (TID) | ORAL | 0 refills | Status: AC | PRN

## 2016-04-06 MED ORDER — KETOROLAC TROMETHAMINE 60 MG/2ML IM SOLN
60.0000 mg | Freq: Once | INTRAMUSCULAR | Status: DC
  Filled 2016-04-06: qty 2

## 2016-04-06 MED ORDER — METOCLOPRAMIDE HCL 10 MG PO TABS
10.0000 mg | ORAL_TABLET | Freq: Once | ORAL | Status: DC
  Filled 2016-04-06: qty 1

## 2016-04-06 MED ORDER — DEXAMETHASONE SODIUM PHOSPHATE 10 MG/ML IJ SOLN
10.0000 mg | Freq: Once | INTRAMUSCULAR | Status: DC
  Filled 2016-04-06: qty 1

## 2016-04-06 MED ORDER — DIPHENHYDRAMINE HCL 25 MG PO CAPS
25.0000 mg | ORAL_CAPSULE | Freq: Once | ORAL | Status: DC
  Filled 2016-04-06: qty 1

## 2016-04-06 MED ORDER — ACETAMINOPHEN 500 MG PO TABS
1000.0000 mg | ORAL_TABLET | Freq: Once | ORAL | Status: AC
Start: 2016-04-06 — End: 2016-04-06
  Administered 2016-04-06: 1000 mg via ORAL
  Filled 2016-04-06: qty 2

## 2016-04-06 MED ORDER — METOCLOPRAMIDE HCL 10 MG PO TABS: 10.0000 mg | ORAL_TABLET | Freq: Three times a day (TID) | ORAL | 0 refills | Status: DC | PRN

## 2016-04-06 NOTE — ED Notes (Signed)
Pt not wanting IM injection, states she's scared of needles. Tapia PA-C notified. See new orders.

## 2016-04-06 NOTE — ED Notes (Signed)
Patient left at this time with all belongings. 

## 2016-04-06 NOTE — ED Provider Notes (Signed)
[facility]EMERGENCY DEPT Provider Note   CSN: [id number] Arrival date & time: 04/06/16  1501     History   Chief Complaint Chief Complaint  Patient presents with  . Headache    HPI Terri Bradford is a 35 y.o. female.  HPI  Pt with "tingling" on the top of her head, began today at 1pm, feels generally unwell w/o focal complaints.  States tingling is not painful, but then states she has been here "for five hours in pain and no one has done anything for me."  She denies dizziness, fever, sweats, chills, neck pain, neck stiffness.  No other sx.  No hx of migraines  Past Medical History:  Diagnosis Date  . Hypertension   . Thrombocytopenia, unspecified (HCC)    Thrombocytopenia    Patient Active Problem List   Diagnosis Date Noted  . Diabetes type 2, controlled (HCC) 12/12/2014  . Unspecified essential hypertension 01/06/2013  . PIH (pregnancy induced hypertension) 01/06/2013    Past Surgical History:  Procedure Laterality Date  . CESAREAN SECTION      OB History    Gravida Para Term Preterm AB Living   3 3 3  0 0 2   SAB TAB Ectopic Multiple Live Births   0 0 0 0 2       Home Medications    Prior to Admission medications   Medication Sig Start Date End Date Taking? Authorizing Provider  ibuprofen (ADVIL,MOTRIN) 200 MG tablet Take 200 mg by mouth every 6 (six) hours as needed for moderate pain.   Yes Historical Provider, MD  lisinopril-hydrochlorothiazide (PRINZIDE,ZESTORETIC) 10-12.5 MG per tablet Take 1 tablet by mouth daily.   Yes Historical Provider, MD    Family History History reviewed. No pertinent family history.  Social History Social History  Substance Use Topics  . Smoking status: Current Every Day Smoker    Packs/day: 0.00    Types: Cigarettes  . Smokeless tobacco: Not on file  . Alcohol use Yes     Allergies   Review of patient's allergies indicates no known allergies.   Review of Systems Review of Systems  All other systems reviewed and  are negative.    Physical Exam Updated Vital Signs BP 118/83 (BP Location: Right Arm)   Pulse (!) 55   Temp 98.6 F (37 C) (Oral)   Resp 18   Ht 5\' 2"  (1.575 m)   Wt 63.5 kg   LMP 03/06/2016   SpO2 100%   BMI 25.61 kg/m   Physical Exam  Constitutional: She is oriented to person, place, and time. She appears well-developed and well-nourished. No distress.  HENT:  Head: Normocephalic and atraumatic.  Right Ear: External ear normal.  Left Ear: External ear normal.  Nose: Nose normal.  Mouth/Throat: Oropharynx is clear and moist. No oropharyngeal exudate.  Eyes: Conjunctivae and EOM are normal. Pupils are equal, round, and reactive to light. Right eye exhibits no discharge. Left eye exhibits no discharge. No scleral icterus.  Neck: Normal range of motion. Neck supple. No tracheal deviation present.  No midline tenderness, no step off  Cardiovascular: Normal rate, regular rhythm, normal heart sounds and intact distal pulses.  Exam reveals no gallop and no friction rub.   No murmur heard. Pulmonary/Chest: Effort normal and breath sounds normal. No stridor. No respiratory distress.  Abdominal: Soft. She exhibits no distension.  Musculoskeletal: Normal range of motion. She exhibits no edema.  Lymphadenopathy:    She has no cervical adenopathy.  Neurological: She is alert  and oriented to person, place, and time. She exhibits normal muscle tone. Coordination normal.  Speech is clear and goal oriented, follows commands Major Cranial nerves without deficit, no facial droop Normal strength in upper and lower extremities bilaterally including dorsiflexion and plantar flexion, strong and equal grip strength Sensation normal to light and sharp touch Moves extremities without ataxia, coordination intact Normal finger to nose and rapid alternating movements Neg romberg, no pronator drift Normal gait and balance   Skin: Skin is warm and dry. Capillary refill takes less than 2 seconds. No  rash noted. She is not diaphoretic. No erythema. No pallor.  Psychiatric: She has a normal mood and affect. Her behavior is normal. Judgment and thought content normal.  Nursing note and vitals reviewed.    ED Treatments / Results  Labs (all labs ordered are listed, but only abnormal results are displayed) Labs Reviewed  URINALYSIS, ROUTINE W REFLEX MICROSCOPIC (NOT AT [facility]) - Abnormal; Notable for the following:       Result Value   APPearance CLOUDY (*)    All other components within normal limits  I-STAT CHEM 8, ED - Abnormal; Notable for the following:    Hemoglobin 16.0 (*)    HCT 47.0 (*)    All other components within normal limits  I-STAT BETA HCG BLOOD, ED ([facility], [facility], [facility] ONLY)    EKG  EKG Interpretation None       Radiology No results found.  Procedures Procedures (including critical care time)  Medications Ordered in ED Medications  metoCLOPramide (REGLAN) tablet 10 mg (10 mg Oral Refused 04/06/16 2054)  diphenhydrAMINE (BENADRYL) capsule 25 mg (0 mg Oral Hold 04/06/16 2048)  ketorolac (TORADOL) injection 60 mg (60 mg Intramuscular Refused 04/06/16 2050)  dexamethasone (DECADRON) injection 10 mg (10 mg Intramuscular Refused 04/06/16 2049)  acetaminophen (TYLENOL) tablet 1,000 mg (1,000 mg Oral Given 04/06/16 2102)     Initial Impression / Assessment and Plan / ED Course  I have reviewed the triage vital signs and the nursing notes.  Pertinent labs & imaging results that were available during my care of the patient were reviewed by me and considered in my medical decision making (see chart for details).  Clinical Course   Pt with "tingling" on the top of her head, began today at 1pm, feels generally unwell w/o focal complaints.  States tingling is not painful, but then states she has been here "for five hours in pain and no one has done anything for me."  She denies dizziness, fever, sweats, chills, neck pain, neck stiffness.  No other sx.  No hx of migraines  On exam  patient is well-appearing, non-focal neurological exam, no cervical spine or paraspinal muscle tenderness, normal range of motion of neck, no meningismus.  Patient afebrile, tolerating PO's.  Ordered reglan, benadryl, IM toradol and decadron, orthostatics, chem 8, pregnancy test, and UA.  Pt refused all meds and requested tylenol.  Presentation is non concerning [facility], ICH, Meningitis, or temporal arteritis. Pt is afebrile with no focal neuro deficits, nuchal rigidity, or change in vision. Pt is to follow up with PCP. Pt verbalizes understanding and is agreeable with plan to dc.    Final Clinical Impressions(s) / ED Diagnoses   Final diagnoses:  Nonintractable headache, unspecified chronicity pattern, unspecified headache type    New Prescriptions New Prescriptions   No medications on file     Danelle BerryLeisa Loretta Kluender, PA-C 04/08/16 1056    Lyndal Pulleyaniel Knott, MD 04/10/16 [phone]

## 2016-04-06 NOTE — ED Triage Notes (Signed)
Pt reports headache starting today. Pt has not taken any OTC medication for the pain. Pt wants to make sure nothing else is going on. Pt denies any other symptoms or changes to everyday activities

## 2016-05-31 ENCOUNTER — Emergency Department (HOSPITAL_COMMUNITY): Payer: Medicaid Other

## 2016-05-31 ENCOUNTER — Encounter (HOSPITAL_COMMUNITY): Payer: Self-pay

## 2016-05-31 ENCOUNTER — Emergency Department (HOSPITAL_COMMUNITY)
Admission: EM | Admit: 2016-05-31 | Discharge: 2016-05-31 | Disposition: A | Discharge status: Home / Self Care | Payer: Medicaid Other | Attending: Emergency Medicine | Admitting: Emergency Medicine

## 2016-05-31 DIAGNOSIS — E119 Type 2 diabetes mellitus without complications: Secondary | ICD-10-CM | POA: Insufficient documentation

## 2016-05-31 DIAGNOSIS — R51 Headache: Secondary | ICD-10-CM | POA: Insufficient documentation

## 2016-05-31 DIAGNOSIS — I10 Essential (primary) hypertension: Secondary | ICD-10-CM | POA: Diagnosis not present

## 2016-05-31 DIAGNOSIS — F17210 Nicotine dependence, cigarettes, uncomplicated: Secondary | ICD-10-CM | POA: Insufficient documentation

## 2016-05-31 DIAGNOSIS — R519 Headache, unspecified: Secondary | ICD-10-CM

## 2016-05-31 MED ORDER — ACETAMINOPHEN 325 MG PO TABS
650.0000 mg | ORAL_TABLET | Freq: Once | ORAL | Status: AC
  Administered 2016-05-31: 650 mg via ORAL
  Filled 2016-05-31: qty 2

## 2016-05-31 MED ORDER — LISINOPRIL-HYDROCHLOROTHIAZIDE 10-12.5 MG PO TABS: 1.0000 | ORAL_TABLET | Freq: Every day | ORAL | 0 refills | Status: DC

## 2016-05-31 NOTE — ED Notes (Signed)
Pt returned from CT, PA canceled test

## 2016-05-31 NOTE — ED Triage Notes (Addendum)
Pt c/o "head pain" starting today. Denies dizziness, blurry vision, double vision, and nausea. Pt denies taking anything for head pain.

## 2016-05-31 NOTE — ED Notes (Signed)
Patient transported to CT 

## 2016-05-31 NOTE — ED Provider Notes (Signed)
[facility] Provider Note   CSN: [id number] Arrival date & time: 05/31/16  0208  History   Chief Complaint Chief Complaint  Patient presents with  . Headache    HPI Terri Bradford is a 35 y.o. female.  HPI   Patient to the ER with PMH of hypertension and thrombocytopenia.  She says that she has had headache for the past week after she had her PCP change her medication for BP from lisinopril HCTZ to Losartan. She changed it because she saw an AD on facebook that said people can get angioedema with Lisinopril. This new medication is now controlling her BP well and her pressures have been higher than normal up to 160 systolic. Her headache is left temporal, throbbing and intermittent. She has not had any change in vision, weakness, N/V/D, confusion, difficulty swallowing, SOB, CP. Denies hx of headaches or weight loss.  Past Medical History:  Diagnosis Date  . Hypertension   . Thrombocytopenia, unspecified    Thrombocytopenia    Patient Active Problem List   Diagnosis Date Noted  . Diabetes type 2, controlled (HCC) 12/12/2014  . Unspecified essential hypertension 01/06/2013  . PIH (pregnancy induced hypertension) 01/06/2013    Past Surgical History:  Procedure Laterality Date  . CESAREAN SECTION      OB History    Gravida Para Term Preterm AB Living   3 3 3  0 0 2   SAB TAB Ectopic Multiple Live Births   0 0 0 0 2       Home Medications    Prior to Admission medications   Medication Sig Start Date End Date Taking? Authorizing Provider  butalbital-acetaminophen-caffeine (FIORICET) 50-325-40 MG tablet Take 1 tablet by mouth every 8 (eight) hours as needed for headache. 04/06/16 04/06/17  Danelle BerryLeisa Tapia, PA-C  ibuprofen (ADVIL,MOTRIN) 200 MG tablet Take 200 mg by mouth every 6 (six) hours as needed for moderate pain.    Historical Provider, MD  lisinopril-hydrochlorothiazide (PRINZIDE,ZESTORETIC) 10-12.5 MG tablet Take 1 tablet by mouth daily. 05/31/16   Agata Lucente  Neva SeatGreene, PA-C  metoCLOPramide (REGLAN) 10 MG tablet Take 1 tablet (10 mg total) by mouth 3 (three) times daily as needed for nausea (headache / nausea). 04/06/16   Danelle BerryLeisa Tapia, PA-C    Family History History reviewed. No pertinent family history.  Social History Social History  Substance Use Topics  . Smoking status: Current Every Day Smoker    Packs/day: 0.00    Types: Cigarettes  . Smokeless tobacco: Never Used  . Alcohol use Yes     Allergies   Review of patient's allergies indicates no known allergies.   Review of Systems Review of Systems  Review of Systems All other systems negative except as documented in the HPI. All pertinent positives and negatives as reviewed in the HPI.  Physical Exam Updated Vital Signs BP 144/100   Pulse (!) 48   Temp 98.3 F (36.8 C) (Oral)   Resp 16   Ht 5\' 1"  (1.549 m)   Wt 64.4 kg   LMP 05/15/2016 (Approximate)   SpO2 100%   BMI 26.83 kg/m   Physical Exam  Constitutional: She appears well-developed and well-nourished. No distress.  HENT:  Head: Normocephalic and atraumatic.  Right Ear: Tympanic membrane and ear canal normal.  Left Ear: Tympanic membrane and ear canal normal.  Nose: Nose normal.  Mouth/Throat: Uvula is midline, oropharynx is clear and moist and mucous membranes are normal.  Eyes: Pupils are equal, round, and reactive to light.  Neck:  Normal range of motion. Neck supple.  Cardiovascular: Normal rate and regular rhythm.   Pulmonary/Chest: Effort normal.  Abdominal: Soft.  No signs of abdominal distention  Musculoskeletal:  No LE swelling  Neurological: She is alert.  Acting at baseline Cranial nerves grossly intact on exam. Pt alert and oriented x 3 Upper and lower extremity strength is symmetrical and physiologic Normal muscular tone No facial droop Coordination intact, no limb ataxia,No pronator drift  Skin: Skin is warm and dry. No rash noted.  Nursing note and vitals reviewed.  ED Treatments /  Results  Labs (all labs ordered are listed, but only abnormal results are displayed) Labs Reviewed - No data to display  EKG  EKG Interpretation None       Radiology No results found.  Procedures Procedures (including critical care time)  Medications Ordered in ED Medications  acetaminophen (TYLENOL) tablet 650 mg (650 mg Oral Given 05/31/16 0300)     Initial Impression / Assessment and Plan / ED Course  I have reviewed the triage vital signs and the nursing notes.  Pertinent labs & imaging results that were available during my care of the patient were reviewed by me and considered in my medical decision making (see chart for details).  Clinical Course   Headache resolved with Tylenol.  She was offered a head ct but declined. Losartan discontinued, will start her back on lisinopril-HCTZ  Pt HA treated and improved while in ED.  Presentation is like pts typical HA and non concerning [facility], Meningitis, or temporal arteritis. Pt is afebrile with no focal neuro deficits, nuchal rigidity, or change in vision. Pt is to follow up with PCP to discuss prophylactic medication. Pt verbalizes understanding and is agreeable with plan to dc.    Final Clinical Impressions(s) / ED Diagnoses   Final diagnoses:  Bad headache    New Prescriptions Current Discharge Medication List       Marlon Peliffany Rosslyn Pasion, PA-C 05/31/16 0422    Shon Batonourtney F Horton, MD 06/02/16 [phone]

## 2016-06-20 ENCOUNTER — Encounter (HOSPITAL_COMMUNITY): Payer: Self-pay | Admitting: *Deleted

## 2016-06-20 ENCOUNTER — Emergency Department (HOSPITAL_COMMUNITY)
Admission: EM | Admit: 2016-06-20 | Discharge: 2016-06-21 | Disposition: A | Discharge status: Home / Self Care | Payer: Medicaid Other | Attending: Emergency Medicine | Admitting: Emergency Medicine

## 2016-06-20 DIAGNOSIS — Z0130 Encounter for examination of blood pressure without abnormal findings: Secondary | ICD-10-CM | POA: Diagnosis not present

## 2016-06-20 DIAGNOSIS — E119 Type 2 diabetes mellitus without complications: Secondary | ICD-10-CM | POA: Diagnosis not present

## 2016-06-20 DIAGNOSIS — F17210 Nicotine dependence, cigarettes, uncomplicated: Secondary | ICD-10-CM | POA: Insufficient documentation

## 2016-06-20 DIAGNOSIS — I10 Essential (primary) hypertension: Secondary | ICD-10-CM | POA: Diagnosis not present

## 2016-06-20 DIAGNOSIS — I959 Hypotension, unspecified: Secondary | ICD-10-CM | POA: Diagnosis present

## 2016-06-20 NOTE — ED Notes (Signed)
Patient denies any other symptoms r/t hypotension (i.e. Dizziness, weakness, etc)

## 2016-06-20 NOTE — ED Provider Notes (Signed)
[facility] Provider Note   CSN: [id number] Arrival date & time: 06/20/16  2212   By signing my name below, I, Terri Bradford, attest that this documentation has been prepared under the direction and in the presence of Derwood KaplanAnkit Kesley Gaffey, MD . Electronically Signed: Nelwyn SalisburyJoshua Bradford, Scribe. 06/20/2016. 11:44 PM.   History   Chief Complaint Chief Complaint  Patient presents with  . Hypotension   The history is provided by the patient. No language interpreter was used.    HPI Comments:  Terri Bradford is a 35 y.o. female with pmhx of HTN who presents to the Emergency Department complaining of sudden-onset episode of hypotension occurring earlier today. Pt states that she checks her blood pressure regularly due to her HTN, and today she found her blood pressure was 101/63, which was low for her, and so she came into the ED. No modifying factors indicated. Pt states that her normal blood pressure medication was switched two weeks ago to 10mg  of lisinopril once daily. She reports associated mild headache. Pt denies any sudden weight change or diet change. No chest pain, dib, dizziness, confusion.  Past Medical History:  Diagnosis Date  . Hypertension   . Thrombocytopenia, unspecified    Thrombocytopenia    Patient Active Problem List   Diagnosis Date Noted  . Diabetes type 2, controlled (HCC) 12/12/2014  . Unspecified essential hypertension 01/06/2013  . PIH (pregnancy induced hypertension) 01/06/2013    Past Surgical History:  Procedure Laterality Date  . CESAREAN SECTION      OB History    Gravida Para Term Preterm AB Living   3 3 3  0 0 2   SAB TAB Ectopic Multiple Live Births   0 0 0 0 2       Home Medications    Prior to Admission medications   Medication Sig Start Date End Date Taking? Authorizing Provider  lisinopril-hydrochlorothiazide (PRINZIDE,ZESTORETIC) 10-12.5 MG tablet Take 1 tablet by mouth daily. 05/31/16  Yes Tiffany Neva SeatGreene, PA-C    butalbital-acetaminophen-caffeine (FIORICET) 50-325-40 MG tablet Take 1 tablet by mouth every 8 (eight) hours as needed for headache. Patient not taking: Reported on 06/21/2016 04/06/16 04/06/17  Danelle BerryLeisa Tapia, PA-C  metoCLOPramide (REGLAN) 10 MG tablet Take 1 tablet (10 mg total) by mouth 3 (three) times daily as needed for nausea (headache / nausea). Patient not taking: Reported on 06/21/2016 04/06/16   Danelle BerryLeisa Tapia, PA-C    Family History No family history on file.  Social History Social History  Substance Use Topics  . Smoking status: Current Every Day Smoker    Packs/day: 0.00    Types: Cigarettes  . Smokeless tobacco: Never Used  . Alcohol use Yes     Allergies   Patient has no known allergies.   Review of Systems Review of Systems 10 Systems reviewed and are negative for acute change except as noted in the HPI.  Physical Exam Updated Vital Signs BP 109/78   Pulse (!) 51   Temp 98.2 F (36.8 C)   Resp 16   Ht 5\' 1"  (1.549 m)   Wt 144 lb (65.3 kg)   LMP 06/08/2016   SpO2 99%   BMI 27.21 kg/m   Physical Exam  Constitutional: She is oriented to person, place, and time. She appears well-developed and well-nourished.  HENT:  Head: Normocephalic.  Eyes: EOM are normal.  Neck: Normal range of motion.  Pulmonary/Chest: Effort normal.  Abdominal: She exhibits no distension.  Musculoskeletal: Normal range of motion.  Neurological: She is  alert and oriented to person, place, and time.  Psychiatric: She has a normal mood and affect.  Nursing note and vitals reviewed.    ED Treatments / Results  DIAGNOSTIC STUDIES:  Oxygen Saturation is 100% on RA, normal by my interpretation.    COORDINATION OF CARE:  12:29 AM Discussed treatment plan with pt at bedside which includes monitoring and pt agreed to plan.  Labs (all labs ordered are listed, but only abnormal results are displayed) Labs Reviewed - No data to display  EKG  EKG Interpretation None        Radiology No results found.  Procedures Procedures (including critical care time)  Medications Ordered in ED Medications - No data to display   Initial Impression / Assessment and Plan / ED Course  I have reviewed the triage vital signs and the nursing notes.  Pertinent labs & imaging results that were available during my care of the patient were reviewed by me and considered in my medical decision making (see chart for details).  Clinical Course    I personally performed the services described in this documentation, which was scribed in my presence. The recorded information has been reviewed and is accurate.  Pt comes in with cc of low BP. She is asymptomatic with the low BP, and the BP here have been in the normal range. Recently she was started on lisinopril in place of another medication - which might have brought her baseline lower than what she is used to. Will d/c. Strict ER return precautions have been discussed, and patient is agreeing with the plan and is comfortable with the workup done and the recommendations from the ER.    Final Clinical Impressions(s) / ED Diagnoses   Final diagnoses:  Blood pressure check    New Prescriptions Discharge Medication List as of 06/21/2016 12:59 AM        Derwood KaplanAnkit Kenidee Cregan, MD 06/21/16 0121

## 2016-06-20 NOTE — ED Triage Notes (Signed)
The pt is c/o low bp today  She takes bp meds  Sl headache  lmp  This month

## 2016-06-21 NOTE — Discharge Instructions (Signed)
Please keep a log of blood pressures. As discussed, if the BP is less than 95 (for the top number) and/or is associated with dizziness, confusion, chest pain, only then it needs intervention.

## 2016-09-08 ENCOUNTER — Other Ambulatory Visit: Payer: Self-pay | Admitting: Family Medicine

## 2016-09-10 ENCOUNTER — Other Ambulatory Visit: Payer: Self-pay | Admitting: Family Medicine

## 2017-02-09 ENCOUNTER — Encounter (HOSPITAL_COMMUNITY): Payer: Self-pay | Admitting: Emergency Medicine

## 2017-02-09 DIAGNOSIS — Z79899 Other long term (current) drug therapy: Secondary | ICD-10-CM | POA: Diagnosis not present

## 2017-02-09 DIAGNOSIS — E119 Type 2 diabetes mellitus without complications: Secondary | ICD-10-CM | POA: Insufficient documentation

## 2017-02-09 DIAGNOSIS — F17210 Nicotine dependence, cigarettes, uncomplicated: Secondary | ICD-10-CM | POA: Insufficient documentation

## 2017-02-09 DIAGNOSIS — N390 Urinary tract infection, site not specified: Secondary | ICD-10-CM | POA: Insufficient documentation

## 2017-02-09 DIAGNOSIS — R1030 Lower abdominal pain, unspecified: Secondary | ICD-10-CM | POA: Diagnosis present

## 2017-02-09 DIAGNOSIS — I10 Essential (primary) hypertension: Secondary | ICD-10-CM | POA: Diagnosis not present

## 2017-02-09 LAB — URINALYSIS, ROUTINE W REFLEX MICROSCOPIC
Bacteria, UA: NONE SEEN
Bilirubin Urine: NEGATIVE
Glucose, UA: NEGATIVE mg/dL
Hgb urine dipstick: NEGATIVE
Ketones, ur: NEGATIVE mg/dL
Nitrite: NEGATIVE
Protein, ur: NEGATIVE mg/dL
Specific Gravity, Urine: 1.027 (ref 1.005–1.030)
pH: 5 (ref 5.0–8.0)

## 2017-02-09 LAB — CBC
HCT: 44.7 % (ref 36.0–46.0)
Hemoglobin: 15.3 g/dL — ABNORMAL HIGH (ref 12.0–15.0)
MCH: 31.9 pg (ref 26.0–34.0)
MCHC: 34.2 g/dL (ref 30.0–36.0)
MCV: 93.3 fL (ref 78.0–100.0)
Platelets: 122 10*3/uL — ABNORMAL LOW (ref 150–400)
RBC: 4.79 MIL/uL (ref 3.87–5.11)
RDW: 13.6 % (ref 11.5–15.5)
WBC: 8.1 10*3/uL (ref 4.0–10.5)

## 2017-02-09 LAB — COMPREHENSIVE METABOLIC PANEL
ALT: 20 U/L (ref 14–54)
AST: 22 U/L (ref 15–41)
Albumin: 3.7 g/dL (ref 3.5–5.0)
Alkaline Phosphatase: 74 U/L (ref 38–126)
Anion gap: 8 (ref 5–15)
BUN: 8 mg/dL (ref 6–20)
CO2: 27 mmol/L (ref 22–32)
Calcium: 9.1 mg/dL (ref 8.9–10.3)
Chloride: 103 mmol/L (ref 101–111)
Creatinine, Ser: 0.87 mg/dL (ref 0.44–1.00)
GFR calc Af Amer: >60 mL/min (ref >60)
GFR calc non Af Amer: >60 mL/min (ref >60)
Glucose, Bld: 140 mg/dL — ABNORMAL HIGH (ref 65–99)
Potassium: 3.5 mmol/L (ref 3.5–5.1)
Sodium: 138 mmol/L (ref 135–145)
Total Bilirubin: 0.7 mg/dL (ref 0.3–1.2)
Total Protein: 6.7 g/dL (ref 6.5–8.1)

## 2017-02-09 LAB — POC URINE PREG, ED: Preg Test, Ur: NEGATIVE

## 2017-02-09 LAB — LIPASE, BLOOD: Lipase: 39 U/L (ref 11–51)

## 2017-02-09 NOTE — ED Triage Notes (Signed)
Pt presents to ED for assessment of lower abdominal pain, sharp in nature, left and right side.  Denies changes in urination, denies changes in bowel movements, denies n/v, denies bleeding.  States pain started at 3pm today.

## 2017-02-10 ENCOUNTER — Emergency Department (HOSPITAL_COMMUNITY)
Admission: EM | Admit: 2017-02-10 | Discharge: 2017-02-10 | Disposition: A | Discharge status: Home / Self Care | Payer: Medicaid Other | Attending: Emergency Medicine | Admitting: Emergency Medicine

## 2017-02-10 DIAGNOSIS — R1030 Lower abdominal pain, unspecified: Secondary | ICD-10-CM

## 2017-02-10 DIAGNOSIS — N390 Urinary tract infection, site not specified: Secondary | ICD-10-CM

## 2017-02-10 LAB — WET PREP, GENITAL
Sperm: NONE SEEN
Trich, Wet Prep: NONE SEEN
Yeast Wet Prep HPF POC: NONE SEEN

## 2017-02-10 LAB — GC/CHLAMYDIA PROBE AMP (~~LOC~~) NOT AT ARMC
Chlamydia: NEGATIVE
Neisseria Gonorrhea: NEGATIVE

## 2017-02-10 MED ORDER — NITROFURANTOIN MONOHYD MACRO 100 MG PO CAPS
100.0000 mg | ORAL_CAPSULE | Freq: Once | ORAL | Status: AC
  Administered 2017-02-10: 100 mg via ORAL
  Filled 2017-02-10: qty 1

## 2017-02-10 MED ORDER — IBUPROFEN 400 MG PO TABS
600.0000 mg | ORAL_TABLET | Freq: Once | ORAL | Status: AC
  Administered 2017-02-10: 600 mg via ORAL
  Filled 2017-02-10: qty 1

## 2017-02-10 MED ORDER — NITROFURANTOIN MONOHYD MACRO 100 MG PO CAPS: 100.0000 mg | ORAL_CAPSULE | Freq: Two times a day (BID) | ORAL | 0 refills | Status: DC

## 2017-02-10 NOTE — ED Notes (Signed)
Pelvic cart set up at bedside  

## 2017-02-10 NOTE — ED Provider Notes (Signed)
[facility] Provider Note   CSN: [id number] Arrival date & time: 02/09/17  2105  By signing my name below, I, Terri Bradford, attest that this documentation has been prepared under the direction and in the presence of Terri Bradford, Terri Folson, MD . Electronically Signed: Freida Busmaniana Bradford, Scribe. 02/10/2017. 12:45 AM.  History   Chief Complaint Chief Complaint  Patient presents with  . Abdominal Pain   The history is provided by the patient. No language interpreter was used.    HPI Comments:  Terri Bradford is a 36 y.o. female who presents to the Emergency Department complaining of 7/10, constant, non-radiating, lower abdominal pain since 1600 yesterday (02/09/2017). She describes the pain as an achy sensation. She has a  h/o bladder infection and notes pain today is similar. She reports exacerbation of pain when urinating. She denies urinary frequency and urgency, malodorous urine, back pain, vaginal bleeding and vaginal discharge. Her LNMP was last month. No alleviating factors noted.   Past Medical History:  Diagnosis Date  . Hypertension   . Thrombocytopenia, unspecified (HCC)    Thrombocytopenia    Patient Active Problem List   Diagnosis Date Noted  . Diabetes type 2, controlled (HCC) 12/12/2014  . Unspecified essential hypertension 01/06/2013  . PIH (pregnancy induced hypertension) 01/06/2013    Past Surgical History:  Procedure Laterality Date  . CESAREAN SECTION      OB History    Gravida Para Term Preterm AB Living   3 3 3  0 0 2   SAB TAB Ectopic Multiple Live Births   0 0 0 0 2       Home Medications    Prior to Admission medications   Medication Sig Start Date End Date Taking? Authorizing Provider  butalbital-acetaminophen-caffeine (FIORICET) 50-325-40 MG tablet Take 1 tablet by mouth every 8 (eight) hours as needed for headache. Patient not taking: Reported on 06/21/2016 04/06/16 04/06/17  Danelle Berryapia, Leisa, PA-C  lisinopril-hydrochlorothiazide (PRINZIDE,ZESTORETIC)  10-12.5 MG tablet Take 1 tablet by mouth daily. 05/31/16   Marlon PelGreene, Tiffany, PA-C  metoCLOPramide (REGLAN) 10 MG tablet Take 1 tablet (10 mg total) by mouth 3 (three) times daily as needed for nausea (headache / nausea). Patient not taking: Reported on 06/21/2016 04/06/16   Danelle Berryapia, Leisa, PA-C    Family History History reviewed. No pertinent family history.  Social History Social History  Substance Use Topics  . Smoking status: Current Every Day Smoker    Packs/day: 0.00    Types: Cigarettes  . Smokeless tobacco: Never Used  . Alcohol use Yes     Allergies   Patient has no known allergies.   Review of Systems Review of Systems  Constitutional: Negative for fever.  Gastrointestinal: Positive for abdominal pain. Negative for vomiting.  Genitourinary: Negative for dysuria, flank pain, frequency, urgency, vaginal bleeding and vaginal discharge.  Musculoskeletal: Negative for back pain.  All other systems reviewed and are negative.    Physical Exam Updated Vital Signs BP 123/76 (BP Location: Right Arm)   Pulse (!) 59   Temp 98.3 F (36.8 C) (Oral)   Resp 18   SpO2 100%   Physical Exam  Constitutional: She is oriented to person, place, and time. She appears well-developed and well-nourished.  HENT:  Head: Normocephalic and atraumatic.  Right Ear: External ear normal.  Left Ear: External ear normal.  Nose: Nose normal.  Eyes: Right eye exhibits no discharge. Left eye exhibits no discharge.  Cardiovascular: Normal rate, regular rhythm and normal heart sounds.   Pulmonary/Chest: Effort normal  and breath sounds normal.  Abdominal: Soft. There is tenderness (mild RLQ). There is no CVA tenderness.  Genitourinary: Uterus is not enlarged. Cervix exhibits no motion tenderness and no friability. Right adnexum displays tenderness (mild). Right adnexum displays no mass. Left adnexum displays no mass and no tenderness. Vaginal discharge found.  Genitourinary Comments: Chaperone was  present for exam which was performed with no discomfort or complications.   Neurological: She is alert and oriented to person, place, and time.  Skin: Skin is warm and dry.  Nursing note and vitals reviewed.    ED Treatments / Results  DIAGNOSTIC STUDIES:  Oxygen Saturation is 100% on RA, normal by my interpretation.    COORDINATION OF CARE:  12:44 AM Discussed treatment plan with pt at bedside and pt agreed to plan.  Labs (all labs ordered are listed, but only abnormal results are displayed) Labs Reviewed  COMPREHENSIVE METABOLIC PANEL - Abnormal; Notable for the following:       Result Value   Glucose, Bld 140 (*)    All other components within normal limits  CBC - Abnormal; Notable for the following:    Hemoglobin 15.3 (*)    Platelets 122 (*)    All other components within normal limits  URINALYSIS, ROUTINE W REFLEX MICROSCOPIC - Abnormal; Notable for the following:    APPearance HAZY (*)    Leukocytes, UA SMALL (*)    Squamous Epithelial / LPF 6-30 (*)    All other components within normal limits  LIPASE, BLOOD  I-STAT BETA HCG BLOOD, ED ([facility], [facility], [facility] ONLY)  POC URINE PREG, ED    EKG  EKG Interpretation None       Radiology No results found.  Procedures Procedures (including critical care time)  Medications Ordered in ED Medications - No data to display   Initial Impression / Assessment and Plan / ED Course  I have reviewed the triage vital signs and the nursing notes.  Pertinent labs & imaging results that were available during my care of the patient were reviewed by me and considered in my medical decision making (see chart for details).     Patient's pain is mild, mostly right pelvic. She states this feels similar to prior UTI. Given dysuria, will treat. No signs/symptoms of pyelo. While it is RLQ, I think appendicitis is unlikely due to only 12 hours of pain and no other typical symptoms. Could have ovarian cyst but torsion seems less likely given  no severe pain, vomiting, etc. I offered u/s which she declines. I do see vaginal discharge which she denies having and has no concern for STI. Will send cultures but at this time not preemptively treat. Discussed plan with shared decision making, will hold on CT. Try antibiotics, if no improvement or worsening, return in 12-24 hours, otherwise see pcp next day. Strict return precautions.  Final Clinical Impressions(s) / ED Diagnoses   Final diagnoses:  Acute UTI (urinary tract infection)  Lower abdominal pain    New Prescriptions New Prescriptions   No medications on file   I personally performed the services described in this documentation, which was scribed in my presence. The recorded information has been reviewed and is accurate.     Terri Loveless, MD 02/10/17 [phone]

## 2017-02-10 NOTE — Discharge Instructions (Signed)
Return to the ER if your abdominal pain does not improve in the next 24-48 hours or if it worsens at any time. Also return if you develop fever, vomiting, back pain or other new/concerning symptoms. Otherwise follow up with your doctor next week.

## 2017-02-11 LAB — URINE CULTURE

## 2017-03-23 ENCOUNTER — Encounter (HOSPITAL_COMMUNITY): Payer: Self-pay | Admitting: Emergency Medicine

## 2017-03-23 ENCOUNTER — Emergency Department (HOSPITAL_COMMUNITY)
Admission: EM | Admit: 2017-03-23 | Discharge: 2017-03-23 | Disposition: A | Discharge status: Home / Self Care | Payer: Medicaid Other | Attending: Emergency Medicine | Admitting: Emergency Medicine

## 2017-03-23 DIAGNOSIS — R51 Headache: Secondary | ICD-10-CM | POA: Diagnosis not present

## 2017-03-23 DIAGNOSIS — I10 Essential (primary) hypertension: Secondary | ICD-10-CM | POA: Diagnosis not present

## 2017-03-23 DIAGNOSIS — F17210 Nicotine dependence, cigarettes, uncomplicated: Secondary | ICD-10-CM | POA: Insufficient documentation

## 2017-03-23 DIAGNOSIS — R42 Dizziness and giddiness: Secondary | ICD-10-CM | POA: Diagnosis not present

## 2017-03-23 DIAGNOSIS — R519 Headache, unspecified: Secondary | ICD-10-CM

## 2017-03-23 DIAGNOSIS — Z79899 Other long term (current) drug therapy: Secondary | ICD-10-CM | POA: Insufficient documentation

## 2017-03-23 DIAGNOSIS — E119 Type 2 diabetes mellitus without complications: Secondary | ICD-10-CM | POA: Diagnosis not present

## 2017-03-23 LAB — CBC WITH DIFFERENTIAL/PLATELET
Basophils Absolute: 0 10*3/uL (ref 0.0–0.1)
Basophils Relative: 0 %
Eosinophils Absolute: 0.1 10*3/uL (ref 0.0–0.7)
Eosinophils Relative: 1 %
HCT: 44.1 % (ref 36.0–46.0)
Hemoglobin: 15.2 g/dL — ABNORMAL HIGH (ref 12.0–15.0)
Lymphocytes Relative: 47 %
Lymphs Abs: 3.4 10*3/uL (ref 0.7–4.0)
MCH: 31.2 pg (ref 26.0–34.0)
MCHC: 34.5 g/dL (ref 30.0–36.0)
MCV: 90.6 fL (ref 78.0–100.0)
Monocytes Absolute: 0.7 10*3/uL (ref 0.1–1.0)
Monocytes Relative: 10 %
Neutro Abs: 3.1 10*3/uL (ref 1.7–7.7)
Neutrophils Relative %: 42 %
Platelets: 132 10*3/uL — ABNORMAL LOW (ref 150–400)
RBC: 4.87 MIL/uL (ref 3.87–5.11)
RDW: 12.9 % (ref 11.5–15.5)
WBC: 7.3 10*3/uL (ref 4.0–10.5)

## 2017-03-23 LAB — BASIC METABOLIC PANEL
Anion gap: 7 (ref 5–15)
BUN: 8 mg/dL (ref 6–20)
CO2: 25 mmol/L (ref 22–32)
Calcium: 9.5 mg/dL (ref 8.9–10.3)
Chloride: 105 mmol/L (ref 101–111)
Creatinine, Ser: 0.84 mg/dL (ref 0.44–1.00)
GFR calc Af Amer: >60 mL/min (ref >60)
GFR calc non Af Amer: >60 mL/min (ref >60)
Glucose, Bld: 98 mg/dL (ref 65–99)
Potassium: 4.5 mmol/L (ref 3.5–5.1)
Sodium: 137 mmol/L (ref 135–145)

## 2017-03-23 NOTE — ED Triage Notes (Signed)
Pt. Stated, Terri Bradford had a headache all day. This is something new. I just dont feel good,

## 2017-03-23 NOTE — ED Provider Notes (Signed)
[facility] Provider Note   CSN: [id number] Arrival date & time: 03/23/17  1649     History   Chief Complaint Chief Complaint  Patient presents with  . Headache    HPI Terri Bradford is a 36 y.o. female with history of HTN and DM type II who presents today with chief complaint gradual onset, waxing and waning lightheadedness. Patient states that she awoke this morning feeling lightheaded, and her symptoms are brought on by position changes. She denies syncope. She also endorses mild frontal headache which has been intermittent all day. She states that it does not last long when it comes on, and is similar in nature to headaches she has had in the past. She states "it is nothing to worry that". She denies numbness, tingling, weakness, photophobia, phonophobia, vision changes. She denies chest pain or shortness of breath. Denies abdominal pain, n/v/d. She states that she took her blood pressure medication this morning as she thought it may have been contributing to her symptoms. Has not tried anything else for her symptoms. She thinks she may be dehydrated. But states she has been eating well. No new medication changes  The history is provided by the patient.    Past Medical History:  Diagnosis Date  . Hypertension   . Thrombocytopenia, unspecified (HCC)    Thrombocytopenia    Patient Active Problem List   Diagnosis Date Noted  . Diabetes type 2, controlled (HCC) 12/12/2014  . Unspecified essential hypertension 01/06/2013  . PIH (pregnancy induced hypertension) 01/06/2013    Past Surgical History:  Procedure Laterality Date  . CESAREAN SECTION      OB History    Gravida Para Term Preterm AB Living   3 3 3  0 0 2   SAB TAB Ectopic Multiple Live Births   0 0 0 0 2       Home Medications    Prior to Admission medications   Medication Sig Start Date End Date Taking? Authorizing Provider  butalbital-acetaminophen-caffeine (FIORICET) 50-325-40 MG tablet Take 1  tablet by mouth every 8 (eight) hours as needed for headache. Patient not taking: Reported on 06/21/2016 04/06/16 04/06/17  Danelle Berry, PA-C  lisinopril-hydrochlorothiazide (PRINZIDE,ZESTORETIC) 10-12.5 MG tablet Take 1 tablet by mouth daily. 05/31/16   Marlon Pel, PA-C  metoCLOPramide (REGLAN) 10 MG tablet Take 1 tablet (10 mg total) by mouth 3 (three) times daily as needed for nausea (headache / nausea). Patient not taking: Reported on 06/21/2016 04/06/16   Danelle Berry, PA-C  nitrofurantoin, macrocrystal-monohydrate, (MACROBID) 100 MG capsule Take 1 capsule (100 mg total) by mouth 2 (two) times daily. 02/10/17   Pricilla Loveless, MD    Family History No family history on file.  Social History Social History  Substance Use Topics  . Smoking status: Current Every Day Smoker    Packs/day: 0.00    Types: Cigarettes  . Smokeless tobacco: Never Used  . Alcohol use Yes     Allergies   Patient has no known allergies.   Review of Systems Review of Systems  Constitutional: Negative for chills and fever.  Respiratory: Negative for shortness of breath.   Cardiovascular: Negative for chest pain and palpitations.  Gastrointestinal: Negative for abdominal pain, constipation, diarrhea, nausea and vomiting.  Neurological: Positive for light-headedness and headaches. Negative for syncope, facial asymmetry, weakness and numbness.  All other systems reviewed and are negative.    Physical Exam Updated Vital Signs BP 124/71   Pulse (!) 53   Temp 98.4 F (36.9 C) (  Oral)   Resp 20   Ht 5\' 2"  (1.575 m)   Wt 64 kg (141 lb)   LMP 01/29/2017   SpO2 100%   BMI 25.79 kg/m   Physical Exam  Constitutional: She is oriented to person, place, and time. She appears well-developed and well-nourished. No distress.  HENT:  Head: Normocephalic and atraumatic.  Right Ear: External ear normal.  Left Ear: External ear normal.  Mouth/Throat: Oropharynx is clear and moist.  Eyes: Pupils are equal,  round, and reactive to light. Conjunctivae and EOM are normal. Right eye exhibits no discharge. Left eye exhibits no discharge.  Neck: Normal range of motion. Neck supple. No JVD present. No tracheal deviation present.  Cardiovascular: Normal rate, regular rhythm, normal heart sounds and intact distal pulses.   Pulmonary/Chest: Effort normal and breath sounds normal. No respiratory distress. She has no wheezes. She has no rales. She exhibits no tenderness.  Abdominal: Soft. Bowel sounds are normal. She exhibits no distension. There is no tenderness.  Musculoskeletal: Normal range of motion. She exhibits no edema or tenderness.  No midline spine TTP, no paraspinal muscle tenderness, no deformity, crepitus, or step-off noted. 5/5 strength of BUE and BLE major muscle groups with good grip strength  Neurological: She is alert and oriented to person, place, and time. No cranial nerve deficit or sensory deficit. She exhibits normal muscle tone.  Mental Status:  Alert, thought content appropriate, able to give a coherent history. Speech fluent without evidence of aphasia. Able to follow 2 step commands without difficulty.  Cranial Nerves:  II:  Peripheral visual fields grossly normal, pupils equal, round, reactive to light III,IV, VI: ptosis not present, extra-ocular motions intact bilaterally  V,VII: smile symmetric, facial light touch sensation equal VIII: hearing grossly normal to voice  X: uvula elevates symmetrically  XI: bilateral shoulder shrug symmetric and strong XII: midline tongue extension without fassiculations Motor:  Normal tone.Strong and equal grip strength and dorsiflexion/plantar flexion Sensory: light touch normal in all extremities. Cerebellar: normal heel to shinw ith BLE Gait: normal gait and balance. Able to walk on toes and heels with ease.     Skin: Skin is warm and dry. Capillary refill takes less than 2 seconds. No erythema.  Psychiatric: She has a normal mood and  affect. Her behavior is normal.  Nursing note and vitals reviewed.    ED Treatments / Results  Labs (all labs ordered are listed, but only abnormal results are displayed) Labs Reviewed  CBC WITH DIFFERENTIAL/PLATELET - Abnormal; Notable for the following:       Result Value   Hemoglobin 15.2 (*)    Platelets 132 (*)    All other components within normal limits  BASIC METABOLIC PANEL    EKG  EKG Interpretation  Date/Time:  Wednesday March 23 2017 19:32:06 EDT Ventricular Rate:  55 PR Interval:  142 QRS Duration: 78 QT Interval:  404 QTC Calculation: 386 R Axis:   75 Text Interpretation:  Sinus bradycardia Otherwise normal ECG since last tracing no significant change Confirmed by Mancel Bale [phone]) on 03/23/2017 8:35:05 PM       Radiology No results found.  Procedures Procedures (including critical care time)  Medications Ordered in ED Medications - No data to display   Initial Impression / Assessment and Plan / ED Course  I have reviewed the triage vital signs and the nursing notes.  Pertinent labs & imaging results that were available during my care of the patient were reviewed by me and considered  in my medical decision making (see chart for details).     Patient with intermittent lightheadedness on position changes and intermittent mild frontal headache. Afebrile, vital signs are stable, and there are no focal neurological deficits on examination. Presentation is not concerning for CVA, TIA, SAH, or other intracranial abnormality. No leukocytosis, anemia, or electrolyte abnormality on lab work. Her EKG shows sinus bradycardia but otherwise no significant changes from last tracing. She is also bradycardic on reevaluation of vitals. This may be contributing to her lightheadedness, but no further emergent workup is required at this time. Recommend follow-up with primary care physician for reevaluation if symptoms persist and possible Holter monitoring. Discussed  indications for return to the ED. Pt verbalized understanding of and agreement with plan and is safe for discharge home at this time.   Final Clinical Impressions(s) / ED Diagnoses   Final diagnoses:  Lightheadedness  Frontal headache    New Prescriptions Discharge Medication List as of 03/23/2017  8:41 PM       Jeanie Sewer, PA-C 03/23/17 2104    Mancel Bale, MD 03/24/17 [phone]

## 2017-03-23 NOTE — Discharge Instructions (Signed)
Take [phone] mg of Tylenol every 6 hours as needed for headache. Do not exceed 4000 mg of Tylenol per day. Drink plenty of fluids and get plenty of rest. Follow-up with your primary care physician if your symptoms persist because you may require something called a Holter monitor. Return to the ED immediately if any concerning signs or symptoms develop such as severe headache, slurred speech, neurological symptoms, or difficulty walking.

## 2018-05-02 ENCOUNTER — Encounter (HOSPITAL_COMMUNITY): Payer: Self-pay | Admitting: Emergency Medicine

## 2018-05-02 ENCOUNTER — Ambulatory Visit (HOSPITAL_COMMUNITY)
Admission: EM | Admit: 2018-05-02 | Discharge: 2018-05-02 | Disposition: A | Discharge status: Home / Self Care | Payer: Self-pay | Attending: Family Medicine | Admitting: Family Medicine

## 2018-05-02 ENCOUNTER — Other Ambulatory Visit: Payer: Self-pay

## 2018-05-02 DIAGNOSIS — K1120 Sialoadenitis, unspecified: Secondary | ICD-10-CM

## 2018-05-02 MED ORDER — CEPHALEXIN 500 MG PO CAPS: 500.0000 mg | ORAL_CAPSULE | Freq: Two times a day (BID) | ORAL | 0 refills | Status: DC

## 2018-05-02 NOTE — ED Provider Notes (Signed)
[facility]    CSN: [id number] Arrival date & time: 05/02/18  1737     History   Chief Complaint Chief Complaint  Patient presents with  . Dental Pain    HPI Terri Bradford is a 37 y.o. female.   Is presenting with left lower jaw pain.  The pain started yesterday.  She denies any fevers or trouble swallowing or breathing.  The pain is severe in nature.  Is localized to the inferior border of her left mandible.  Denies any radiation.  Is able to chew but does have pain on the left side.  She denies any discharge in her mouth.  She denies any trauma.  She has had a history with a similar symptom as this.  She has had resolution of her symptoms when she was prescribed antibiotics.  She denies any changes in food.  She denies any fevers or chills.  HPI  Past Medical History:  Diagnosis Date  . Hypertension   . Thrombocytopenia, unspecified (HCC)    Thrombocytopenia    Patient Active Problem List   Diagnosis Date Noted  . Diabetes type 2, controlled (HCC) 12/12/2014  . Unspecified essential hypertension 01/06/2013  . PIH (pregnancy induced hypertension) 01/06/2013    Past Surgical History:  Procedure Laterality Date  . CESAREAN SECTION      OB History    Gravida  3   Para  3   Term  3   Preterm  0   AB  0   Living  2     SAB  0   TAB  0   Ectopic  0   Multiple  0   Live Births  2            Home Medications    Prior to Admission medications   Medication Sig Start Date End Date Taking? Authorizing Provider  cephALEXin (KEFLEX) 500 MG capsule Take 1 capsule (500 mg total) by mouth 2 (two) times daily. 05/02/18   Myra Rude, MD  lisinopril-hydrochlorothiazide (PRINZIDE,ZESTORETIC) 10-12.5 MG tablet Take 1 tablet by mouth daily. 05/31/16   Marlon Pel, PA-C  metoCLOPramide (REGLAN) 10 MG tablet Take 1 tablet (10 mg total) by mouth 3 (three) times daily as needed for nausea (headache / nausea). Patient not taking: Reported on  06/21/2016 04/06/16   Danelle Berry, PA-C  nitrofurantoin, macrocrystal-monohydrate, (MACROBID) 100 MG capsule Take 1 capsule (100 mg total) by mouth 2 (two) times daily. 02/10/17   Pricilla Loveless, MD    Family History Family History  Problem Relation Age of Onset  . Hypertension Mother     Social History Social History   Tobacco Use  . Smoking status: Current Every Day Smoker    Packs/day: 0.00    Types: Cigarettes  . Smokeless tobacco: Never Used  Substance Use Topics  . Alcohol use: Yes  . Drug use: No     Allergies   Patient has no known allergies.   Review of Systems Review of Systems  Constitutional: Negative for fever.  HENT: Positive for facial swelling. Negative for congestion and drooling.   Respiratory: Negative for shortness of breath.   Cardiovascular: Negative for chest pain.  Gastrointestinal: Negative for abdominal pain.  Musculoskeletal: Negative for arthralgias.  Neurological: Negative for weakness.  Hematological: Positive for adenopathy.  Psychiatric/Behavioral: Negative for agitation.     Physical Exam Triage Vital Signs ED Triage Vitals  Enc Vitals Group     BP 05/02/18 1815 109/72  Pulse Rate 05/02/18 1815 64     Resp 05/02/18 1815 16     Temp 05/02/18 1815 98.2 F (36.8 C)     Temp Source 05/02/18 1815 Oral     SpO2 05/02/18 1815 100 %     Weight --      Height --      Head Circumference --      Peak Flow --      Pain Score 05/02/18 1813 10     Pain Loc --      Pain Edu? --      Excl. in GC? --    No data found.  Updated Vital Signs BP 109/72 (BP Location: Left Arm)   Pulse 64   Temp 98.2 F (36.8 C) (Oral)   Resp 16   LMP 04/02/2018   SpO2 100%   Visual Acuity Right Eye Distance:   Left Eye Distance:   Bilateral Distance:    Right Eye Near:   Left Eye Near:    Bilateral Near:     Physical Exam Gen: NAD, alert, cooperative with exam, well-appearing ENT: normal lips, normal nasal mucosa, enlarged submandibular  gland left lower border.  Tenderness to palpation over this area.  No overlying redness.  Does have left-sided facial swelling.  No discharge expressed with milking of this area.  Teeth appear to be intact. Eye: normal EOM, normal conjunctiva and lids CV:  no edema, +2 pedal pulses   Resp: no accessory muscle use, non-labored,  Skin: no rashes, no areas of induration  Neuro: normal tone, normal sensation to touch Psych:  normal insight, alert and oriented MSK: Normal gait normal strength   UC Treatments / Results  Labs (all labs ordered are listed, but only abnormal results are displayed) Labs Reviewed - No data to display  EKG None  Radiology No results found.  Procedures Procedures (including critical care time)  Medications Ordered in UC Medications - No data to display  Initial Impression / Assessment and Plan / UC Course  I have reviewed the triage vital signs and the nursing notes.  Pertinent labs & imaging results that were available during my care of the patient were reviewed by me and considered in my medical decision making (see chart for details).     She is presenting with significant pain in the left lower mandible to suggest sialoadenitis.  Does not appear to be dental caries or abscess.  Does not appear to be an abscess at this point.  Not having any systemic symptoms.  Will prescribe her Keflex and counseled on supportive care.  Was provided with precautions if she is no improvement then she should return.  If she has any trouble breathing or swallowing then she should seek immediate care.   Final Clinical Impressions(s) / UC Diagnoses   Final diagnoses:  Sialoadenitis     Discharge Instructions     Please suck on lemon drops  You should start getting better in two days. Please follow up if you don't start to get better.  Please go to the emergency room if you have any trouble breathing or swallowing.     ED Prescriptions    Medication Sig Dispense  Auth. Provider   cephALEXin (KEFLEX) 500 MG capsule Take 1 capsule (500 mg total) by mouth 2 (two) times daily. 14 capsule Myra Rude, MD     Controlled Substance Prescriptions [place] Controlled Substance Registry consulted? Not Applicable   Myra Rude, MD 05/02/18 1850

## 2018-05-02 NOTE — Discharge Instructions (Signed)
Please suck on lemon drops  You should start getting better in two days. Please follow up if you don't start to get better.  Please go to the emergency room if you have any trouble breathing or swallowing.

## 2018-05-02 NOTE — ED Triage Notes (Signed)
Left, bottom dental pain.  Pain started yesterday

## 2018-08-25 ENCOUNTER — Other Ambulatory Visit: Payer: Self-pay

## 2018-08-25 ENCOUNTER — Emergency Department (HOSPITAL_COMMUNITY)
Admission: EM | Admit: 2018-08-25 | Discharge: 2018-08-26 | Disposition: A | Discharge status: Home / Self Care | Payer: Medicaid Other | Attending: Emergency Medicine | Admitting: Emergency Medicine

## 2018-08-25 ENCOUNTER — Encounter (HOSPITAL_COMMUNITY): Payer: Self-pay | Admitting: Emergency Medicine

## 2018-08-25 DIAGNOSIS — E119 Type 2 diabetes mellitus without complications: Secondary | ICD-10-CM | POA: Insufficient documentation

## 2018-08-25 DIAGNOSIS — R519 Headache, unspecified: Secondary | ICD-10-CM

## 2018-08-25 DIAGNOSIS — R51 Headache: Secondary | ICD-10-CM | POA: Diagnosis not present

## 2018-08-25 DIAGNOSIS — Z79899 Other long term (current) drug therapy: Secondary | ICD-10-CM | POA: Insufficient documentation

## 2018-08-25 DIAGNOSIS — I10 Essential (primary) hypertension: Secondary | ICD-10-CM | POA: Insufficient documentation

## 2018-08-25 DIAGNOSIS — F17210 Nicotine dependence, cigarettes, uncomplicated: Secondary | ICD-10-CM | POA: Insufficient documentation

## 2018-08-25 NOTE — ED Triage Notes (Addendum)
C/o frontal headache x 2 days.  Denies any other symptoms with it.  Took Tylenol and Sinus medication 3 hours ago and it got worse.  No neuro deficits noted on triage exam.

## 2018-08-26 MED ORDER — HYDROCODONE-ACETAMINOPHEN 5-325 MG PO TABS
1.0000 | ORAL_TABLET | Freq: Once | ORAL | Status: AC
  Administered 2018-08-26: 1 via ORAL
  Filled 2018-08-26: qty 1

## 2018-08-26 MED ORDER — DIPHENHYDRAMINE HCL 25 MG PO CAPS
25.0000 mg | ORAL_CAPSULE | Freq: Once | ORAL | Status: AC
  Administered 2018-08-26: 25 mg via ORAL
  Filled 2018-08-26: qty 1

## 2018-08-26 MED ORDER — DEXAMETHASONE 4 MG PO TABS
10.0000 mg | ORAL_TABLET | Freq: Once | ORAL | Status: AC
  Administered 2018-08-26: 10 mg via ORAL
  Filled 2018-08-26: qty 3

## 2018-08-26 NOTE — Discharge Instructions (Addendum)
Recommend using Nasacort nasal spray, Zyrtec daily (can use Benadryl 25 mg every 6 hours as a substitute), Tylenol and/or ibuprofen for any additional pain. If headache persists, follow up with your doctor in 4-5 days for recheck. Return to the emergency department with any high fever, severe pain or new concern.

## 2018-08-26 NOTE — ED Provider Notes (Signed)
[facility] EMERGENCY DEPARTMENT Provider Note   CSN: [id number] Arrival date & time: 08/25/18  1956     History   Chief Complaint Chief Complaint  Patient presents with  . Headache    HPI Terri Bradford is a 38 y.o. female.  Patient to ED with complaint of frontal headache for the past 2 days. No fever, significant congestion, sore throat, cough. She states that leaning forward and going to a standing position make the pain worse and describes the headache as throbbing. No visual changes, nausea, vomiting, weakness or numbness. She has taken an OTC congestion medication and reports that her pain worsened prompting ED visit.   The history is provided by the patient. No language interpreter was used.  Headache  Associated symptoms: no congestion, no cough, no fever, no myalgias, no nausea, no neck stiffness, no numbness, no sore throat, no vomiting and no weakness     Past Medical History:  Diagnosis Date  . Hypertension   . Thrombocytopenia, unspecified (HCC)    Thrombocytopenia    Patient Active Problem List   Diagnosis Date Noted  . Diabetes type 2, controlled (HCC) 12/12/2014  . Unspecified essential hypertension 01/06/2013  . PIH (pregnancy induced hypertension) 01/06/2013    Past Surgical History:  Procedure Laterality Date  . CESAREAN SECTION       OB History    Gravida  3   Para  3   Term  3   Preterm  0   AB  0   Living  2     SAB  0   TAB  0   Ectopic  0   Multiple  0   Live Births  2            Home Medications    Prior to Admission medications   Medication Sig Start Date End Date Taking? Authorizing Provider  cephALEXin (KEFLEX) 500 MG capsule Take 1 capsule (500 mg total) by mouth 2 (two) times daily. 05/02/18   Myra Rude, MD  lisinopril-hydrochlorothiazide (PRINZIDE,ZESTORETIC) 10-12.5 MG tablet Take 1 tablet by mouth daily. 05/31/16   Marlon Pel, PA-C  metoCLOPramide (REGLAN) 10 MG tablet Take  1 tablet (10 mg total) by mouth 3 (three) times daily as needed for nausea (headache / nausea). Patient not taking: Reported on 06/21/2016 04/06/16   Danelle Berry, PA-C  nitrofurantoin, macrocrystal-monohydrate, (MACROBID) 100 MG capsule Take 1 capsule (100 mg total) by mouth 2 (two) times daily. 02/10/17   Pricilla Loveless, MD    Family History Family History  Problem Relation Age of Onset  . Hypertension Mother     Social History Social History   Tobacco Use  . Smoking status: Current Every Day Smoker    Packs/day: 0.00    Types: Cigarettes  . Smokeless tobacco: Never Used  Substance Use Topics  . Alcohol use: Yes  . Drug use: No     Allergies   Patient has no known allergies.   Review of Systems Review of Systems  Constitutional: Negative for chills and fever.  HENT: Negative.  Negative for congestion and sore throat.   Respiratory: Negative.  Negative for cough and shortness of breath.   Cardiovascular: Negative.   Gastrointestinal: Negative.  Negative for nausea and vomiting.  Musculoskeletal: Negative.  Negative for myalgias and neck stiffness.  Skin: Negative.   Neurological: Positive for headaches. Negative for weakness and numbness.     Physical Exam Updated Vital Signs BP (!) 155/92 (BP Location: Right  Arm)   Pulse 77   Temp 99.3 F (37.4 C) (Oral)   Resp 17   LMP 08/25/2018   SpO2 99%   Physical Exam Vitals signs and nursing note reviewed.  Constitutional:      Appearance: She is well-developed.  HENT:     Head: Normocephalic.     Nose:     Right Turbinates: Enlarged and swollen.     Left Turbinates: Enlarged and swollen.  Eyes:     Conjunctiva/sclera: Conjunctivae normal.  Neck:     Musculoskeletal: Normal range of motion and neck supple.  Cardiovascular:     Rate and Rhythm: Normal rate and regular rhythm.     Heart sounds: No murmur.  Pulmonary:     Effort: Pulmonary effort is normal.     Breath sounds: Normal breath sounds. No wheezing,  rhonchi or rales.  Abdominal:     General: Bowel sounds are normal.     Palpations: Abdomen is soft.     Tenderness: There is no abdominal tenderness. There is no guarding or rebound.  Musculoskeletal: Normal range of motion.  Skin:    General: Skin is warm and dry.     Findings: No rash.  Neurological:     Mental Status: She is alert and oriented to person, place, and time.     Comments: CN's 3-12 grossly intact. Speech is clear and focused. No facial asymmetry. No lateralizing weakness. No deficits of coordination. Ambulatory without imbalance.        ED Treatments / Results  Labs (all labs ordered are listed, but only abnormal results are displayed) Labs Reviewed - No data to display  EKG None  Radiology No results found.  Procedures Procedures (including critical care time)  Medications Ordered in ED Medications  dexamethasone (DECADRON) tablet 10 mg (has no administration in time range)  HYDROcodone-acetaminophen (NORCO/VICODIN) 5-325 MG per tablet 1 tablet (has no administration in time range)  diphenhydrAMINE (BENADRYL) capsule 25 mg (has no administration in time range)     Initial Impression / Assessment and Plan / ED Course  I have reviewed the triage vital signs and the nursing notes.  Pertinent labs & imaging results that were available during my care of the patient were reviewed by me and considered in my medical decision making (see chart for details).     Patient to ED with headache/facial pain x 2 days. No fever, nausea, vomiting. Decongestants without relief. Feels symptoms are getting worse.   Pain described is felt characteristic of sinus headache. Do no suspect bacterial infection without fever. Decadron provided in ED, single dose Norco for pain. Recommend OTC medications at home for symptom relief and PCP follow up if headache persists.   Final Clinical Impressions(s) / ED Diagnoses   Final diagnoses:  None   1. Sinus headache  ED  Discharge Orders    None       Elpidio Anis, PA-C 08/26/18 0013    Derwood Kaplan, MD 08/26/18 [phone]

## 2019-02-01 ENCOUNTER — Encounter (HOSPITAL_COMMUNITY): Payer: Self-pay | Admitting: Emergency Medicine

## 2019-02-01 ENCOUNTER — Other Ambulatory Visit: Payer: Self-pay

## 2019-02-01 ENCOUNTER — Emergency Department (HOSPITAL_COMMUNITY)
Admission: EM | Admit: 2019-02-01 | Discharge: 2019-02-01 | Disposition: A | Discharge status: Home / Self Care | Payer: Medicaid Other | Attending: Emergency Medicine | Admitting: Emergency Medicine

## 2019-02-01 DIAGNOSIS — W458XXA Other foreign body or object entering through skin, initial encounter: Secondary | ICD-10-CM | POA: Diagnosis not present

## 2019-02-01 DIAGNOSIS — I10 Essential (primary) hypertension: Secondary | ICD-10-CM | POA: Diagnosis not present

## 2019-02-01 DIAGNOSIS — F17210 Nicotine dependence, cigarettes, uncomplicated: Secondary | ICD-10-CM | POA: Insufficient documentation

## 2019-02-01 DIAGNOSIS — E119 Type 2 diabetes mellitus without complications: Secondary | ICD-10-CM | POA: Diagnosis not present

## 2019-02-01 DIAGNOSIS — Z79899 Other long term (current) drug therapy: Secondary | ICD-10-CM | POA: Diagnosis not present

## 2019-02-01 DIAGNOSIS — R040 Epistaxis: Secondary | ICD-10-CM

## 2019-02-01 NOTE — ED Notes (Signed)
Pt discharged from ED; instructions provided; Pt encouraged to return to ED if symptoms worsen and to f/u with PCP; Pt verbalized understanding of all instructions 

## 2019-02-01 NOTE — ED Triage Notes (Signed)
Pt had her nose pierced today, will not stop bleeding.

## 2019-02-01 NOTE — ED Provider Notes (Signed)
[facility] Provider Note   CSN: [id number] Arrival date & time: 02/01/19  2142     History   Chief Complaint Chief Complaint  Patient presents with  . facial bleeding    HPI Terri Bradford is a 38 y.o. female.     The history is provided by the patient. No language interpreter was used.     38 year old female with history of diabetes presenting for evaluation of nosebleed.  Patient states approximately 3 hours ago she had her nose pierced.  It begins to bleed afterward and it would not stop.  She removed the piercing but the bleeding persists.  She was concerned prompting ER visit.  She denies any significant pain.  She is not on any blood thinner medication.  She has been applying pressure to the affected area.  Past Medical History:  Diagnosis Date  . Hypertension   . Thrombocytopenia, unspecified (HCC)    Thrombocytopenia    Patient Active Problem List   Diagnosis Date Noted  . Diabetes type 2, controlled (HCC) 12/12/2014  . Unspecified essential hypertension 01/06/2013  . PIH (pregnancy induced hypertension) 01/06/2013    Past Surgical History:  Procedure Laterality Date  . CESAREAN SECTION       OB History    Gravida  3   Para  3   Term  3   Preterm  0   AB  0   Living  2     SAB  0   TAB  0   Ectopic  0   Multiple  0   Live Births  2            Home Medications    Prior to Admission medications   Medication Sig Start Date End Date Taking? Authorizing Provider  cephALEXin (KEFLEX) 500 MG capsule Take 1 capsule (500 mg total) by mouth 2 (two) times daily. 05/02/18   Myra RudeSchmitz, Jeremy E, MD  lisinopril-hydrochlorothiazide (PRINZIDE,ZESTORETIC) 10-12.5 MG tablet Take 1 tablet by mouth daily. 05/31/16   Marlon PelGreene, Tiffany, PA-C  metoCLOPramide (REGLAN) 10 MG tablet Take 1 tablet (10 mg total) by mouth 3 (three) times daily as needed for nausea (headache / nausea). Patient not taking: Reported on  06/21/2016 04/06/16   Danelle Berryapia, Leisa, PA-C  nitrofurantoin, macrocrystal-monohydrate, (MACROBID) 100 MG capsule Take 1 capsule (100 mg total) by mouth 2 (two) times daily. 02/10/17   Pricilla LovelessGoldston, Scott, MD    Family History Family History  Problem Relation Age of Onset  . Hypertension Mother     Social History Social History   Tobacco Use  . Smoking status: Current Every Day Smoker    Packs/day: 0.00    Types: Cigarettes  . Smokeless tobacco: Never Used  Substance Use Topics  . Alcohol use: Yes  . Drug use: No     Allergies   Patient has no known allergies.   Review of Systems Review of Systems  Constitutional: Negative for fever.  Skin: Positive for wound.     Physical Exam Updated Vital Signs BP (!) 152/101 (BP Location: Right Arm)   Pulse 85   Temp 98 F (36.7 C) (Oral)   Resp 16   Ht 5\' 1"  (1.549 m)   Wt 66.2 kg   SpO2 100%   BMI 27.59 kg/m   Physical Exam Vitals signs and nursing note reviewed.  Constitutional:      General: She is not in acute distress.    Appearance: She is well-developed.  HENT:  Head: Atraumatic.     Nose:     Comments: Small puncture wound in left nares from recent piercing.  Not actively bleeding. Eyes:     Conjunctiva/sclera: Conjunctivae normal.  Neck:     Musculoskeletal: Neck supple.  Skin:    Findings: No rash.  Neurological:     Mental Status: She is alert.      ED Treatments / Results  Labs (all labs ordered are listed, but only abnormal results are displayed) Labs Reviewed - No data to display  EKG None  Radiology No results found.  Procedures Procedures (including critical care time)  Medications Ordered in ED Medications - No data to display   Initial Impression / Assessment and Plan / ED Course  I have reviewed the triage vital signs and the nursing notes.  Pertinent labs & imaging results that were available during my care of the patient were reviewed by me and considered in my medical  decision making (see chart for details).        BP (!) 152/101 (BP Location: Right Arm)   Pulse 85   Temp 98 F (36.7 C) (Oral)   Resp 16   Ht 5\' 1"  (1.549 m)   Wt 66.2 kg   SpO2 100%   BMI 27.59 kg/m    Final Clinical Impressions(s) / ED Diagnoses   Final diagnoses:  Bleeding from the nose    ED Discharge Orders    None     10:56 PM Patient voiced concern for persistent bleeding after having her nose pierced.  On exam, bleeding has since stopped.  Will monitor, anticipate discharge.  11:41 PM Pt was monitored, no active bleeding.  Stable for discharge   Doy Hutching 02/01/19 2341    Carmin Muskrat, MD 02/05/19 [phone]

## 2019-04-18 ENCOUNTER — Emergency Department (HOSPITAL_COMMUNITY)
Admission: EM | Admit: 2019-04-18 | Discharge: 2019-04-18 | Discharge status: Against Medical Advice | Payer: Medicaid Other | Attending: Emergency Medicine | Admitting: Emergency Medicine

## 2019-04-18 ENCOUNTER — Encounter (HOSPITAL_COMMUNITY): Payer: Self-pay | Admitting: Emergency Medicine

## 2019-04-18 ENCOUNTER — Other Ambulatory Visit: Payer: Self-pay

## 2019-04-18 DIAGNOSIS — Z5321 Procedure and treatment not carried out due to patient leaving prior to being seen by health care provider: Secondary | ICD-10-CM | POA: Diagnosis not present

## 2019-04-18 DIAGNOSIS — R51 Headache: Secondary | ICD-10-CM | POA: Diagnosis present

## 2019-04-18 HISTORY — DX: Type 2 diabetes mellitus without complications: E11.9

## 2019-04-18 NOTE — ED Notes (Signed)
Pt states she does nlot wish to wait any longer. States she will return if things worsen

## 2019-04-18 NOTE — ED Triage Notes (Signed)
Pt c/o headache that started yesterday with right hand numbness. A&O x 4, moves all extremities well, speech clear, no neuro deficits noted.

## 2019-05-24 ENCOUNTER — Other Ambulatory Visit: Payer: Self-pay | Admitting: Family

## 2019-05-24 DIAGNOSIS — Z1231 Encounter for screening mammogram for malignant neoplasm of breast: Secondary | ICD-10-CM

## 2019-08-06 ENCOUNTER — Emergency Department (HOSPITAL_COMMUNITY): Payer: Medicaid Other

## 2019-08-06 ENCOUNTER — Other Ambulatory Visit: Payer: Self-pay

## 2019-08-06 ENCOUNTER — Emergency Department (HOSPITAL_COMMUNITY)
Admission: EM | Admit: 2019-08-06 | Discharge: 2019-08-06 | Discharge status: Against Medical Advice | Payer: Medicaid Other | Attending: Emergency Medicine | Admitting: Emergency Medicine

## 2019-08-06 ENCOUNTER — Encounter (HOSPITAL_COMMUNITY): Payer: Self-pay | Admitting: Emergency Medicine

## 2019-08-06 DIAGNOSIS — Z5321 Procedure and treatment not carried out due to patient leaving prior to being seen by health care provider: Secondary | ICD-10-CM | POA: Diagnosis not present

## 2019-08-06 DIAGNOSIS — R0789 Other chest pain: Secondary | ICD-10-CM | POA: Diagnosis present

## 2019-08-06 LAB — BASIC METABOLIC PANEL
Anion gap: 13 (ref 5–15)
BUN: 5 mg/dL — ABNORMAL LOW (ref 6–20)
CO2: 24 mmol/L (ref 22–32)
Calcium: 9.7 mg/dL (ref 8.9–10.3)
Chloride: 98 mmol/L (ref 98–111)
Creatinine, Ser: 0.89 mg/dL (ref 0.44–1.00)
GFR calc Af Amer: >60 mL/min (ref >60)
GFR calc non Af Amer: >60 mL/min (ref >60)
Glucose, Bld: 164 mg/dL — ABNORMAL HIGH (ref 70–99)
Potassium: 2.7 mmol/L — CL (ref 3.5–5.1)
Sodium: 135 mmol/L (ref 135–145)

## 2019-08-06 LAB — I-STAT BETA HCG BLOOD, ED (MC, WL, AP ONLY): I-stat hCG, quantitative: <5 m[IU]/mL (ref <5)

## 2019-08-06 LAB — CBC
HCT: 47.1 % — ABNORMAL HIGH (ref 36.0–46.0)
Hemoglobin: 16.3 g/dL — ABNORMAL HIGH (ref 12.0–15.0)
MCH: 32 pg (ref 26.0–34.0)
MCHC: 34.6 g/dL (ref 30.0–36.0)
MCV: 92.4 fL (ref 80.0–100.0)
Platelets: 145 10*3/uL — ABNORMAL LOW (ref 150–400)
RBC: 5.1 MIL/uL (ref 3.87–5.11)
RDW: 12.7 % (ref 11.5–15.5)
WBC: 7.1 10*3/uL (ref 4.0–10.5)
nRBC: 0 % (ref 0.0–0.2)

## 2019-08-06 LAB — TROPONIN I (HIGH SENSITIVITY)
Troponin I (High Sensitivity): 5 ng/L (ref <18)
Troponin I (High Sensitivity): 4 ng/L (ref <18)

## 2019-08-06 MED ORDER — SODIUM CHLORIDE 0.9% FLUSH: 3.0000 mL | Freq: Once | INTRAVENOUS | Status: DC

## 2019-08-06 NOTE — ED Triage Notes (Signed)
Pt reports left sided chest pain that started yesterday which she believed she experiencing gas however tonight she began to get sharp shooting pains that start in her left should blade, goes across her shoulder and down her left arm.  Denies any N/V/D, fevers or any other symptoms.

## 2019-08-24 ENCOUNTER — Emergency Department (HOSPITAL_COMMUNITY)
Admission: EM | Admit: 2019-08-24 | Discharge: 2019-08-24 | Disposition: A | Discharge status: Home / Self Care | Payer: Medicaid Other | Attending: Emergency Medicine | Admitting: Emergency Medicine

## 2019-08-24 ENCOUNTER — Encounter (HOSPITAL_COMMUNITY): Payer: Self-pay | Admitting: Emergency Medicine

## 2019-08-24 ENCOUNTER — Emergency Department (HOSPITAL_COMMUNITY): Payer: Medicaid Other

## 2019-08-24 ENCOUNTER — Other Ambulatory Visit: Payer: Self-pay

## 2019-08-24 DIAGNOSIS — Z79899 Other long term (current) drug therapy: Secondary | ICD-10-CM | POA: Insufficient documentation

## 2019-08-24 DIAGNOSIS — R531 Weakness: Secondary | ICD-10-CM | POA: Diagnosis not present

## 2019-08-24 DIAGNOSIS — E119 Type 2 diabetes mellitus without complications: Secondary | ICD-10-CM | POA: Insufficient documentation

## 2019-08-24 DIAGNOSIS — R42 Dizziness and giddiness: Secondary | ICD-10-CM | POA: Diagnosis not present

## 2019-08-24 DIAGNOSIS — I10 Essential (primary) hypertension: Secondary | ICD-10-CM | POA: Diagnosis not present

## 2019-08-24 LAB — CBG MONITORING, ED: Glucose-Capillary: 118 mg/dL — ABNORMAL HIGH (ref 70–99)

## 2019-08-24 LAB — BASIC METABOLIC PANEL
Anion gap: 10 (ref 5–15)
BUN: <5 mg/dL — ABNORMAL LOW (ref 6–20)
CO2: 26 mmol/L (ref 22–32)
Calcium: 9.5 mg/dL (ref 8.9–10.3)
Chloride: 98 mmol/L (ref 98–111)
Creatinine, Ser: 0.87 mg/dL (ref 0.44–1.00)
GFR calc Af Amer: >60 mL/min (ref >60)
GFR calc non Af Amer: >60 mL/min (ref >60)
Glucose, Bld: 117 mg/dL — ABNORMAL HIGH (ref 70–99)
Potassium: 3.6 mmol/L (ref 3.5–5.1)
Sodium: 134 mmol/L — ABNORMAL LOW (ref 135–145)

## 2019-08-24 LAB — CBC
HCT: 46.6 % — ABNORMAL HIGH (ref 36.0–46.0)
Hemoglobin: 15.8 g/dL — ABNORMAL HIGH (ref 12.0–15.0)
MCH: 31.8 pg (ref 26.0–34.0)
MCHC: 33.9 g/dL (ref 30.0–36.0)
MCV: 93.8 fL (ref 80.0–100.0)
Platelets: 154 10*3/uL (ref 150–400)
RBC: 4.97 MIL/uL (ref 3.87–5.11)
RDW: 12.6 % (ref 11.5–15.5)
WBC: 7.3 10*3/uL (ref 4.0–10.5)
nRBC: 0 % (ref 0.0–0.2)

## 2019-08-24 LAB — TROPONIN I (HIGH SENSITIVITY): Troponin I (High Sensitivity): 3 ng/L (ref <18)

## 2019-08-24 MED ORDER — SODIUM CHLORIDE 0.9% FLUSH: 3.0000 mL | Freq: Once | INTRAVENOUS | Status: DC

## 2019-08-24 NOTE — Discharge Instructions (Addendum)
It was our pleasure to provide your ER care today - we hope that you feel better.  Your lab work looks good - today, your potassium level is normal.   Make sure to stay well hydrated, drink plenty of fluids, and eat balanced diet including plenty of fruits and vegetables.   Follow up with primary care doctor in the coming week.  Return to ER if worse, new symptoms, fevers, new or severe pain, trouble breathing, weak/fainting, chest pain or other concern.

## 2019-08-24 NOTE — ED Triage Notes (Signed)
Pt here with c/o lightheaded and weakness along with some slight chest pain and sob ,pt was here on the 4th with low K of 2.7

## 2019-08-24 NOTE — ED Provider Notes (Addendum)
[facility] EMERGENCY DEPARTMENT Provider Note   CSN: [id number] Arrival date & time: 08/24/19  1805     History Chief Complaint  Patient presents with  . Dizziness    Terri Bradford is a 39 y.o. female.  Patient states earlier this evening felt mildly lightheaded, and weak.  No room spinning or vertigo. No imbalance or problems w gait. Symptoms acute onset, mild, persistent, now improved. Denies syncope or feeling as if about to faint. No associated cp or discomfort. No sob or unusual doe. No palpitations. Denies recent change in meds or new meds. States was concerned as was told potassium was recently low. Denies abd pain or nvd. No cough or uri symptoms. No known covid+ exposure. No fever or chills. Denies dysuria or gu c/o. No recent blood loss, rectal bleeding or vaginal bleeding.   The history is provided by the patient.  Dizziness Associated symptoms: no blood in stool, no chest pain, no diarrhea, no headaches, no shortness of breath and no vomiting        Past Medical History:  Diagnosis Date  . Diabetes mellitus without complication (HCC)   . Hypertension   . Thrombocytopenia, unspecified (HCC)    Thrombocytopenia    Patient Active Problem List   Diagnosis Date Noted  . Diabetes type 2, controlled (HCC) 12/12/2014  . Unspecified essential hypertension 01/06/2013  . PIH (pregnancy induced hypertension) 01/06/2013    Past Surgical History:  Procedure Laterality Date  . CESAREAN SECTION       OB History    Gravida  3   Para  3   Term  3   Preterm  0   AB  0   Living  2     SAB  0   TAB  0   Ectopic  0   Multiple  0   Live Births  2           Family History  Problem Relation Age of Onset  . Hypertension Mother     Social History   Tobacco Use  . Smoking status: Current Every Day Smoker    Packs/day: 0.00    Types: Cigarettes  . Smokeless tobacco: Never Used  Substance Use Topics  . Alcohol use: Yes  . Drug  use: No    Home Medications Prior to Admission medications   Medication Sig Start Date End Date Taking? Authorizing Provider  cephALEXin (KEFLEX) 500 MG capsule Take 1 capsule (500 mg total) by mouth 2 (two) times daily. 05/02/18   Myra Rude, MD  lisinopril-hydrochlorothiazide (PRINZIDE,ZESTORETIC) 10-12.5 MG tablet Take 1 tablet by mouth daily. 05/31/16   Marlon Pel, PA-C  metoCLOPramide (REGLAN) 10 MG tablet Take 1 tablet (10 mg total) by mouth 3 (three) times daily as needed for nausea (headache / nausea). Patient not taking: Reported on 06/21/2016 04/06/16   Danelle Berry, PA-C  nitrofurantoin, macrocrystal-monohydrate, (MACROBID) 100 MG capsule Take 1 capsule (100 mg total) by mouth 2 (two) times daily. 02/10/17   Pricilla Loveless, MD    Allergies    Patient has no known allergies.  Review of Systems   Review of Systems  Constitutional: Negative for chills and fever.  HENT: Negative for sore throat.   Eyes: Negative for visual disturbance.  Respiratory: Negative for cough and shortness of breath.   Cardiovascular: Negative for chest pain.  Gastrointestinal: Negative for abdominal pain, blood in stool, diarrhea and vomiting.  Genitourinary: Negative for dysuria, flank pain and vaginal bleeding.  Musculoskeletal: Negative for back pain.  Skin: Negative for rash.  Neurological: Positive for dizziness. Negative for headaches.  Hematological: Does not bruise/bleed easily.  Psychiatric/Behavioral: Negative for confusion.    Physical Exam Updated Vital Signs BP 124/85 (BP Location: Right Arm)   Pulse 89   Temp 98.4 F (36.9 C) (Oral)   Resp 18   LMP 08/10/2019 (Exact Date)   SpO2 99%   Physical Exam Vitals and nursing note reviewed.  Constitutional:      Appearance: Normal appearance. She is well-developed.  HENT:     Head: Atraumatic.     Nose: Nose normal.     Mouth/Throat:     Mouth: Mucous membranes are moist.  Eyes:     General: No scleral icterus.     Conjunctiva/sclera: Conjunctivae normal.     Pupils: Pupils are equal, round, and reactive to light.  Neck:     Trachea: No tracheal deviation.  Cardiovascular:     Rate and Rhythm: Normal rate and regular rhythm.     Pulses: Normal pulses.     Heart sounds: Normal heart sounds. No murmur. No friction rub. No gallop.   Pulmonary:     Effort: Pulmonary effort is normal. No respiratory distress.     Breath sounds: Normal breath sounds.  Abdominal:     General: Bowel sounds are normal. There is no distension.     Palpations: Abdomen is soft.     Tenderness: There is no abdominal tenderness. There is no guarding.  Genitourinary:    Comments: No cva tenderness.  Musculoskeletal:        General: No swelling or tenderness.     Cervical back: Normal range of motion and neck supple. No rigidity. No muscular tenderness.  Skin:    General: Skin is warm and dry.     Findings: No rash.  Neurological:     Mental Status: She is alert.     Comments: Alert, speech normal. Motor/sens grossly intact bil. Steady gait.   Psychiatric:        Mood and Affect: Mood normal.     ED Results / Procedures / Treatments   Labs (all labs ordered are listed, but only abnormal results are displayed) Results for orders placed or performed during the hospital encounter of 68/34/19  Basic metabolic panel  Result Value Ref Range   Sodium 134 (L) 135 - 145 mmol/L   Potassium 3.6 3.5 - 5.1 mmol/L   Chloride 98 98 - 111 mmol/L   CO2 26 22 - 32 mmol/L   Glucose, Bld 117 (H) 70 - 99 mg/dL   BUN <5 (L) 6 - 20 mg/dL   Creatinine, Ser 0.87 0.44 - 1.00 mg/dL   Calcium 9.5 8.9 - 10.3 mg/dL   GFR calc non Af Amer >60 >60 mL/min   GFR calc Af Amer >60 >60 mL/min   Anion gap 10 5 - 15  CBC  Result Value Ref Range   WBC 7.3 4.0 - 10.5 K/uL   RBC 4.97 3.87 - 5.11 MIL/uL   Hemoglobin 15.8 (H) 12.0 - 15.0 g/dL   HCT 46.6 (H) 36.0 - 46.0 %   MCV 93.8 80.0 - 100.0 fL   MCH 31.8 26.0 - 34.0 pg   MCHC 33.9 30.0 - 36.0  g/dL   RDW 12.6 11.5 - 15.5 %   Platelets 154 150 - 400 K/uL   nRBC 0.0 0.0 - 0.2 %  POC CBG, ED  Result Value Ref Range   Glucose-Capillary  118 (H) 70 - 99 mg/dL   Comment 1 Notify RN    Comment 2 Document in Chart   Troponin I (High Sensitivity)  Result Value Ref Range   Troponin I (High Sensitivity) 3 <18 ng/L   DG Chest 2 View  Result Date: 08/24/2019 CLINICAL DATA:  Shortness of breath EXAM: CHEST - 2 VIEW COMPARISON:  08/06/2019 FINDINGS: Heart and mediastinal contours are within normal limits. No focal opacities or effusions. No acute bony abnormality. IMPRESSION: No active cardiopulmonary disease. Electronically Signed   By: Charlett Nose M.D.   On: 08/24/2019 19:08   DG Chest 2 View  Result Date: 08/06/2019 CLINICAL DATA:  Left-sided chest pain. EXAM: CHEST - 2 VIEW COMPARISON:  12/27/2015 FINDINGS: The cardiomediastinal contours are normal. The lungs are clear. Pulmonary vasculature is normal. No consolidation, pleural effusion, or pneumothorax. No acute osseous abnormalities are seen. IMPRESSION: Normal radiographs of the chest. Electronically Signed   By: Narda Rutherford M.D.   On: 08/06/2019 02:14    EKG EKG Interpretation  Date/Time:  Friday August 24 2019 18:50:02 EST Ventricular Rate:  67 PR Interval:  142 QRS Duration: 72 QT Interval:  394 QTC Calculation: 416 R Axis:   73 Text Interpretation: Normal sinus rhythm Nonspecific T wave abnormality Confirmed by Cathren Laine ([id number]) on 08/24/2019 8:01:40 PM   Radiology DG Chest 2 View  Result Date: 08/24/2019 CLINICAL DATA:  Shortness of breath EXAM: CHEST - 2 VIEW COMPARISON:  08/06/2019 FINDINGS: Heart and mediastinal contours are within normal limits. No focal opacities or effusions. No acute bony abnormality. IMPRESSION: No active cardiopulmonary disease. Electronically Signed   By: Charlett Nose M.D.   On: 08/24/2019 19:08    Procedures Procedures (including critical care time)  Medications Ordered in  ED Medications  sodium chloride flush (NS) 0.9 % injection 3 mL (has no administration in time range)    ED Course  I have reviewed the triage vital signs and the nursing notes.  Pertinent labs & imaging results that were available during my care of the patient were reviewed by me and considered in my medical decision making (see chart for details).    MDM Rules/Calculators/A&P                      Labs. Continuous pulse ox and monitor - remains in nsr on monitor.   Reviewed nursing notes and prior charts for additional history.   Labs reviewed/interpreted by me - k is normal. hgb 15. Trop is normal.  Xrays reviewed/interpreted by me - no pna.  Po fluids.  Patient currently is asymptomatic and requests d/c to home.   Rec pcp f/u.  Return precautions provided.     Final Clinical Impression(s) / ED Diagnoses Final diagnoses:  None    Rx / DC Orders ED Discharge Orders    None         Cathren Laine, MD 08/24/19 2052

## 2019-12-26 ENCOUNTER — Ambulatory Visit (INDEPENDENT_AMBULATORY_CARE_PROVIDER_SITE_OTHER): Payer: Medicaid Other

## 2019-12-26 DIAGNOSIS — O0990 Supervision of high risk pregnancy, unspecified, unspecified trimester: Secondary | ICD-10-CM | POA: Insufficient documentation

## 2019-12-26 DIAGNOSIS — Z3480 Encounter for supervision of other normal pregnancy, unspecified trimester: Secondary | ICD-10-CM

## 2019-12-26 MED ORDER — BLOOD PRESSURE KIT DEVI: 1.0000 | 0 refills | Status: DC

## 2019-12-26 NOTE — Progress Notes (Signed)
PRENATAL INTAKE SUMMARY  Ms. Tewell presents today New OB Nurse Interview.  OB History    Gravida  5   Para  3   Term  3   Preterm  0   AB  1   Living  2     SAB  0   TAB  0   Ectopic  0   Multiple  0   Live Births  2          I have reviewed the patient's medical, obstetrical, social, and family histories, medications, and available lab results.  SUBJECTIVE She has no unusual complaints  OBJECTIVE Initial Nurse Intake (New OB)  GENERAL APPEARANCE: oriented to person, place and time   ASSESSMENT Normal pregnancy  PLAN Prenatal care at [facility] PHQ-9=1 PN labs will be done at [facility] visit Pregnancy Risk Screening NOB appt 01/03/2020

## 2020-01-03 ENCOUNTER — Other Ambulatory Visit: Payer: Self-pay

## 2020-01-03 ENCOUNTER — Encounter: Payer: Self-pay | Admitting: Obstetrics and Gynecology

## 2020-01-03 ENCOUNTER — Other Ambulatory Visit (HOSPITAL_COMMUNITY)
Admission: RE | Admit: 2020-01-03 | Discharge: 2020-01-03 | Disposition: A | Discharge status: Home / Self Care | Payer: Medicaid Other | Source: Ambulatory Visit | Attending: Obstetrics and Gynecology | Admitting: Obstetrics and Gynecology

## 2020-01-03 ENCOUNTER — Ambulatory Visit (INDEPENDENT_AMBULATORY_CARE_PROVIDER_SITE_OTHER): Payer: Medicaid Other | Admitting: Obstetrics and Gynecology

## 2020-01-03 VITALS — BP 122/77 | HR 80 | Wt 145.2 lb

## 2020-01-03 DIAGNOSIS — E119 Type 2 diabetes mellitus without complications: Secondary | ICD-10-CM

## 2020-01-03 DIAGNOSIS — Z3480 Encounter for supervision of other normal pregnancy, unspecified trimester: Secondary | ICD-10-CM

## 2020-01-03 DIAGNOSIS — O10919 Unspecified pre-existing hypertension complicating pregnancy, unspecified trimester: Secondary | ICD-10-CM | POA: Insufficient documentation

## 2020-01-03 DIAGNOSIS — Z8759 Personal history of other complications of pregnancy, childbirth and the puerperium: Secondary | ICD-10-CM | POA: Insufficient documentation

## 2020-01-03 DIAGNOSIS — O24111 Pre-existing diabetes mellitus, type 2, in pregnancy, first trimester: Secondary | ICD-10-CM | POA: Diagnosis not present

## 2020-01-03 DIAGNOSIS — Z98891 History of uterine scar from previous surgery: Secondary | ICD-10-CM | POA: Diagnosis not present

## 2020-01-03 DIAGNOSIS — Z3A11 11 weeks gestation of pregnancy: Secondary | ICD-10-CM

## 2020-01-03 DIAGNOSIS — O09521 Supervision of elderly multigravida, first trimester: Secondary | ICD-10-CM

## 2020-01-03 DIAGNOSIS — O09529 Supervision of elderly multigravida, unspecified trimester: Secondary | ICD-10-CM | POA: Insufficient documentation

## 2020-01-03 MED ORDER — ASPIRIN EC 81 MG PO TBEC: 81.0000 mg | DELAYED_RELEASE_TABLET | Freq: Every day | ORAL | 2 refills | Status: DC

## 2020-01-03 NOTE — Progress Notes (Signed)
Pt is here for initial OB visit, LMP 10/13/19. 07/19/20. Hx of IUFD. Hx Csection. Hx pregnancy induced hypertension.

## 2020-01-03 NOTE — Patient Instructions (Signed)
First Trimester of Pregnancy The first trimester of pregnancy is from week 1 until the end of week 13 (months 1 through 3). A week after a sperm fertilizes an egg, the egg will implant on the wall of the uterus. This embryo will begin to develop into a baby. Genes from you and your partner will form the baby. The female genes will determine whether the baby will be a boy or a girl. At 6-8 weeks, the eyes and face will be formed, and the heartbeat can be seen on ultrasound. At the end of 12 weeks, all the baby's organs will be formed. Now that you are pregnant, you will want to do everything you can to have a healthy baby. Two of the most important things are to get good prenatal care and to follow your health care provider's instructions. Prenatal care is all the medical care you receive before the baby's birth. This care will help prevent, find, and treat any problems during the pregnancy and childbirth. Body changes during your first trimester Your body goes through many changes during pregnancy. The changes vary from woman to woman.  You may gain or lose a couple of pounds at first.  You may feel sick to your stomach (nauseous) and you may throw up (vomit). If the vomiting is uncontrollable, call your health care provider.  You may tire easily.  You may develop headaches that can be relieved by medicines. All medicines should be approved by your health care provider.  You may urinate more often. Painful urination may mean you have a bladder infection.  You may develop heartburn as a result of your pregnancy.  You may develop constipation because certain hormones are causing the muscles that push stool through your intestines to slow down.  You may develop hemorrhoids or swollen veins (varicose veins).  Your breasts may begin to grow larger and become tender. Your nipples may stick out more, and the tissue that surrounds them (areola) may become darker.  Your gums may bleed and may be  sensitive to brushing and flossing.  Dark spots or blotches (chloasma, mask of pregnancy) may develop on your face. This will likely fade after the baby is born.  Your menstrual periods will stop.  You may have a loss of appetite.  You may develop cravings for certain kinds of food.  You may have changes in your emotions from day to day, such as being excited to be pregnant or being concerned that something may go wrong with the pregnancy and baby.  You may have more vivid and strange dreams.  You may have changes in your hair. These can include thickening of your hair, rapid growth, and changes in texture. Some women also have hair loss during or after pregnancy, or hair that feels dry or thin. Your hair will most likely return to normal after your baby is born. What to expect at prenatal visits During a routine prenatal visit:  You will be weighed to make sure you and the baby are growing normally.  Your blood pressure will be taken.  Your abdomen will be measured to track your baby's growth.  The fetal heartbeat will be listened to between weeks 10 and 14 of your pregnancy.  Test results from any previous visits will be discussed. Your health care provider may ask you:  How you are feeling.  If you are feeling the baby move.  If you have had any abnormal symptoms, such as leaking fluid, bleeding, severe headaches, or abdominal   cramping.  If you are using any tobacco products, including cigarettes, chewing tobacco, and electronic cigarettes.  If you have any questions. Other tests that may be performed during your first trimester include:  Blood tests to find your blood type and to check for the presence of any previous infections. The tests will also be used to check for low iron levels (anemia) and protein on red blood cells (Rh antibodies). Depending on your risk factors, or if you previously had diabetes during pregnancy, you may have tests to check for high blood sugar  that affects pregnant women (gestational diabetes).  Urine tests to check for infections, diabetes, or protein in the urine.  An ultrasound to confirm the proper growth and development of the baby.  Fetal screens for spinal cord problems (spina bifida) and Down syndrome.  HIV (human immunodeficiency virus) testing. Routine prenatal testing includes screening for HIV, unless you choose not to have this test.  You may need other tests to make sure you and the baby are doing well. Follow these instructions at home: Medicines  Follow your health care provider's instructions regarding medicine use. Specific medicines may be either safe or unsafe to take during pregnancy.  Take a prenatal vitamin that contains at least 600 micrograms (mcg) of folic acid.  If you develop constipation, try taking a stool softener if your health care provider approves. Eating and drinking   Eat a balanced diet that includes fresh fruits and vegetables, whole grains, good sources of protein such as meat, eggs, or tofu, and low-fat dairy. Your health care provider will help you determine the amount of weight gain that is right for you.  Avoid raw meat and uncooked cheese. These carry germs that can cause birth defects in the baby.  Eating four or five small meals rather than three large meals a day may help relieve nausea and vomiting. If you start to feel nauseous, eating a few soda crackers can be helpful. Drinking liquids between meals, instead of during meals, also seems to help ease nausea and vomiting.  Limit foods that are high in fat and processed sugars, such as fried and sweet foods.  To prevent constipation: ? Eat foods that are high in fiber, such as fresh fruits and vegetables, whole grains, and beans. ? Drink enough fluid to keep your urine clear or pale yellow. Activity  Exercise only as directed by your health care provider. Most women can continue their usual exercise routine during  pregnancy. Try to exercise for 30 minutes at least 5 days a week. Exercising will help you: ? Control your weight. ? Stay in shape. ? Be prepared for labor and delivery.  Experiencing pain or cramping in the lower abdomen or lower back is a good sign that you should stop exercising. Check with your health care provider before continuing with normal exercises.  Try to avoid standing for long periods of time. Move your legs often if you must stand in one place for a long time.  Avoid heavy lifting.  Wear low-heeled shoes and practice good posture.  You may continue to have sex unless your health care provider tells you not to. Relieving pain and discomfort  Wear a good support bra to relieve breast tenderness.  Take warm sitz baths to soothe any pain or discomfort caused by hemorrhoids. Use hemorrhoid cream if your health care provider approves.  Rest with your legs elevated if you have leg cramps or low back pain.  If you develop varicose veins in   your legs, wear support hose. Elevate your feet for 15 minutes, 3-4 times a day. Limit salt in your diet. Prenatal care  Schedule your prenatal visits by the twelfth week of pregnancy. They are usually scheduled monthly at first, then more often in the last 2 months before delivery.  Write down your questions. Take them to your prenatal visits.  Keep all your prenatal visits as told by your health care provider. This is important. Safety  Wear your seat belt at all times when driving.  Make a list of emergency phone numbers, including numbers for family, friends, the hospital, and police and fire departments. General instructions  Ask your health care provider for a referral to a local prenatal education class. Begin classes no later than the beginning of month 6 of your pregnancy.  Ask for help if you have counseling or nutritional needs during pregnancy. Your health care provider can offer advice or refer you to specialists for help  with various needs.  Do not use hot tubs, steam rooms, or saunas.  Do not douche or use tampons or scented sanitary pads.  Do not cross your legs for long periods of time.  Avoid cat litter boxes and soil used by cats. These carry germs that can cause birth defects in the baby and possibly loss of the fetus by miscarriage or stillbirth.  Avoid all smoking, herbs, alcohol, and medicines not prescribed by your health care provider. Chemicals in these products affect the formation and growth of the baby.  Do not use any products that contain nicotine or tobacco, such as cigarettes and e-cigarettes. If you need help quitting, ask your health care provider. You may receive counseling support and other resources to help you quit.  Schedule a dentist appointment. At home, brush your teeth with a soft toothbrush and be gentle when you floss. Contact a health care provider if:  You have dizziness.  You have mild pelvic cramps, pelvic pressure, or nagging pain in the abdominal area.  You have persistent nausea, vomiting, or diarrhea.  You have a bad smelling vaginal discharge.  You have pain when you urinate.  You notice increased swelling in your face, hands, legs, or ankles.  You are exposed to fifth disease or chickenpox.  You are exposed to German measles (rubella) and have never had it. Get help right away if:  You have a fever.  You are leaking fluid from your vagina.  You have spotting or bleeding from your vagina.  You have severe abdominal cramping or pain.  You have rapid weight gain or loss.  You vomit blood or material that looks like coffee grounds.  You develop a severe headache.  You have shortness of breath.  You have any kind of trauma, such as from a fall or a car accident. Summary  The first trimester of pregnancy is from week 1 until the end of week 13 (months 1 through 3).  Your body goes through many changes during pregnancy. The changes vary from  woman to woman.  You will have routine prenatal visits. During those visits, your health care provider will examine you, discuss any test results you may have, and talk with you about how you are feeling. This information is not intended to replace advice given to you by your health care provider. Make sure you discuss any questions you have with your health care provider. Document Revised: 07/01/2017 Document Reviewed: 06/30/2016 Elsevier Patient Education  2020 [employer] Inc[employer]  

## 2020-01-03 NOTE — Progress Notes (Signed)
Subjective:  Terri Bradford is a 39 y.o. [id number] at [redacted]w[redacted]d being seen today for her first OB appt. EDD by LMP. Prenatal history as noted. No meds for HTN or DM. H/O successful VBAC.  Marland Kitchen  She is currently monitored for the following issues for this high-risk pregnancy and has Diabetes type 2, controlled (HCC); Supervision of other normal pregnancy, antepartum; History of intrauterine fetal death in previous pregnancy; Chronic hypertension in pregnancy; History of cesarean section; and AMA (advanced maternal age) multigravida 35+ on their problem list.  Patient reports no complaints.  Contractions: Not present. Vag. Bleeding: None.   . Denies leaking of fluid.   The following portions of the patient's history were reviewed and updated as appropriate: allergies, current medications, past family history, past medical history, past social history, past surgical history and problem list. Problem list updated.  Objective:   Vitals:   01/03/20 1308  BP: 122/77  Pulse: 80  Weight: 145 lb 3.2 oz (65.9 kg)    Fetal Status: Fetal Heart Rate (bpm): 170         General:  Alert, oriented and cooperative. Patient is in no acute distress.  Skin: Skin is warm and dry. No rash noted.   Cardiovascular: Normal heart rate noted  Respiratory: Normal respiratory effort, no problems with respiration noted  Abdomen: Soft, gravid, appropriate for gestational age. Pain/Pressure: Absent     Pelvic:  Cervical exam performed        Extremities: Normal range of motion.  Edema: None  Mental Status: Normal mood and affect. Normal behavior. Normal judgment and thought content.  Breast sym supple no nipple discharge or adenopathy  Urinalysis:      Assessment and Plan:  Pregnancy: [id number] at [redacted]w[redacted]d  1. History of intrauterine fetal death in previous pregnancy Secondary to placental abruption  2. Supervision of other normal pregnancy, antepartum Prenatal care and labs reviewed with pt Genetic testing discussed  3.  Controlled type 2 diabetes mellitus without complication, without long-term current use of insulin (HCC) Will check A1C Pt has glucometer Instructed to check CBG's fasting and 2 hr PP and record in [facility] CBG goals reviewed DM and pregnancy reviewed Importance of CBG control to reduce pregnancy risks discussed  4. Chronic hypertension in pregnancy Will start BASA BP cuff ordered and monitoring reviewed with pp   5. History of cesarean section Successful TOLAC  6. Multigravida of advanced maternal age in first trimester Genetic testing  Preterm labor symptoms and general obstetric precautions including but not limited to vaginal bleeding, contractions, leaking of fluid and fetal movement were reviewed in detail with the patient. Please refer to After Visit Summary for other counseling recommendations.  Return in about 4 weeks (around 01/31/2020) for OB visit, face to face, any provider.   Hermina Staggers, MD

## 2020-01-04 ENCOUNTER — Encounter: Payer: Self-pay | Admitting: Obstetrics and Gynecology

## 2020-01-04 DIAGNOSIS — D696 Thrombocytopenia, unspecified: Secondary | ICD-10-CM | POA: Insufficient documentation

## 2020-01-04 LAB — CBC/D/PLT+RPR+RH+ABO+RUB AB...
Antibody Screen: NEGATIVE
Basophils Absolute: 0 10*3/uL (ref 0.0–0.2)
Basos: 0 %
EOS (ABSOLUTE): 0 10*3/uL (ref 0.0–0.4)
Eos: 0 %
HCV Ab: <0.1 s/co ratio (ref 0.0–0.9)
HIV Screen 4th Generation wRfx: NONREACTIVE
Hematocrit: 42.3 % (ref 34.0–46.6)
Hemoglobin: 14.4 g/dL (ref 11.1–15.9)
Hepatitis B Surface Ag: NEGATIVE
Immature Grans (Abs): 0 10*3/uL (ref 0.0–0.1)
Immature Granulocytes: 0 %
Lymphocytes Absolute: 2.1 10*3/uL (ref 0.7–3.1)
Lymphs: 28 %
MCH: 31.9 pg (ref 26.6–33.0)
MCHC: 34 g/dL (ref 31.5–35.7)
MCV: 94 fL (ref 79–97)
Monocytes Absolute: 0.4 10*3/uL (ref 0.1–0.9)
Monocytes: 6 %
Neutrophils Absolute: 4.7 10*3/uL (ref 1.4–7.0)
Neutrophils: 66 %
Platelets: 100 10*3/uL — CL (ref 150–450)
RBC: 4.51 x10E6/uL (ref 3.77–5.28)
RDW: 13 % (ref 11.7–15.4)
RPR Ser Ql: NONREACTIVE
Rh Factor: POSITIVE
Rubella Antibodies, IGG: <0.9 index — ABNORMAL LOW (ref >0.99)
WBC: 7.3 10*3/uL (ref 3.4–10.8)

## 2020-01-04 LAB — COMPREHENSIVE METABOLIC PANEL
ALT: 6 IU/L (ref 0–32)
AST: 11 IU/L (ref 0–40)
Albumin/Globulin Ratio: 1.6 (ref 1.2–2.2)
Albumin: 3.9 g/dL (ref 3.8–4.8)
Alkaline Phosphatase: 68 IU/L (ref 48–121)
BUN/Creatinine Ratio: 7 — ABNORMAL LOW (ref 9–23)
BUN: 5 mg/dL — ABNORMAL LOW (ref 6–24)
Bilirubin Total: 0.4 mg/dL (ref 0.0–1.2)
CO2: 20 mmol/L (ref 20–29)
Calcium: 9.2 mg/dL (ref 8.7–10.2)
Chloride: 107 mmol/L — ABNORMAL HIGH (ref 96–106)
Creatinine, Ser: 0.72 mg/dL (ref 0.57–1.00)
GFR calc Af Amer: 121 mL/min/{1.73_m2} (ref >59)
GFR calc non Af Amer: 105 mL/min/{1.73_m2} (ref >59)
Globulin, Total: 2.4 g/dL (ref 1.5–4.5)
Glucose: 145 mg/dL — ABNORMAL HIGH (ref 65–99)
Potassium: 3.9 mmol/L (ref 3.5–5.2)
Sodium: 140 mmol/L (ref 134–144)
Total Protein: 6.3 g/dL (ref 6.0–8.5)

## 2020-01-04 LAB — PROTEIN / CREATININE RATIO, URINE
Creatinine, Urine: 143.6 mg/dL
Protein, Ur: 13.1 mg/dL
Protein/Creat Ratio: 91 mg/g creat (ref 0–200)

## 2020-01-04 LAB — CERVICOVAGINAL ANCILLARY ONLY
Chlamydia: NEGATIVE
Comment: NEGATIVE
Comment: NORMAL
Neisseria Gonorrhea: NEGATIVE

## 2020-01-04 LAB — HEMOGLOBIN A1C
Est. average glucose Bld gHb Est-mCnc: 128 mg/dL
Hgb A1c MFr Bld: 6.1 % — ABNORMAL HIGH (ref 4.8–5.6)

## 2020-01-04 LAB — HCV INTERPRETATION

## 2020-01-04 LAB — TSH: TSH: 0.792 u[IU]/mL (ref 0.450–4.500)

## 2020-01-05 LAB — URINE CULTURE, OB REFLEX

## 2020-01-05 LAB — CULTURE, OB URINE

## 2020-01-08 ENCOUNTER — Encounter: Payer: Self-pay | Admitting: Obstetrics and Gynecology

## 2020-01-08 LAB — CYTOLOGY - PAP
Adequacy: ABSENT
Comment: NEGATIVE
Diagnosis: NEGATIVE
High risk HPV: NEGATIVE

## 2020-01-10 ENCOUNTER — Encounter: Payer: Self-pay | Admitting: Obstetrics and Gynecology

## 2020-01-15 ENCOUNTER — Encounter (HOSPITAL_COMMUNITY): Payer: Self-pay | Admitting: Emergency Medicine

## 2020-01-15 ENCOUNTER — Other Ambulatory Visit: Payer: Self-pay

## 2020-01-15 ENCOUNTER — Emergency Department (HOSPITAL_COMMUNITY)
Admission: EM | Admit: 2020-01-15 | Discharge: 2020-01-16 | Disposition: A | Discharge status: Home / Self Care | Payer: Medicaid Other | Attending: Emergency Medicine | Admitting: Emergency Medicine

## 2020-01-15 DIAGNOSIS — Z5321 Procedure and treatment not carried out due to patient leaving prior to being seen by health care provider: Secondary | ICD-10-CM | POA: Insufficient documentation

## 2020-01-15 DIAGNOSIS — K0889 Other specified disorders of teeth and supporting structures: Secondary | ICD-10-CM | POA: Insufficient documentation

## 2020-01-15 NOTE — ED Triage Notes (Signed)
Patient reports left upper molar pain for 3 days unrelieved by OTC pain medications .

## 2020-01-16 NOTE — ED Notes (Signed)
Called with no answer 

## 2020-01-31 ENCOUNTER — Ambulatory Visit (INDEPENDENT_AMBULATORY_CARE_PROVIDER_SITE_OTHER): Payer: Medicaid Other | Admitting: Family Medicine

## 2020-01-31 ENCOUNTER — Other Ambulatory Visit: Payer: Self-pay

## 2020-01-31 VITALS — BP 131/89 | HR 99 | Wt 144.3 lb

## 2020-01-31 DIAGNOSIS — Z8759 Personal history of other complications of pregnancy, childbirth and the puerperium: Secondary | ICD-10-CM

## 2020-01-31 DIAGNOSIS — O99112 Other diseases of the blood and blood-forming organs and certain disorders involving the immune mechanism complicating pregnancy, second trimester: Secondary | ICD-10-CM

## 2020-01-31 DIAGNOSIS — O10919 Unspecified pre-existing hypertension complicating pregnancy, unspecified trimester: Secondary | ICD-10-CM

## 2020-01-31 DIAGNOSIS — O99119 Other diseases of the blood and blood-forming organs and certain disorders involving the immune mechanism complicating pregnancy, unspecified trimester: Secondary | ICD-10-CM

## 2020-01-31 DIAGNOSIS — E119 Type 2 diabetes mellitus without complications: Secondary | ICD-10-CM

## 2020-01-31 DIAGNOSIS — Z98891 History of uterine scar from previous surgery: Secondary | ICD-10-CM

## 2020-01-31 DIAGNOSIS — O0990 Supervision of high risk pregnancy, unspecified, unspecified trimester: Secondary | ICD-10-CM

## 2020-01-31 DIAGNOSIS — Z7984 Long term (current) use of oral hypoglycemic drugs: Secondary | ICD-10-CM

## 2020-01-31 DIAGNOSIS — Z3A15 15 weeks gestation of pregnancy: Secondary | ICD-10-CM

## 2020-01-31 DIAGNOSIS — D696 Thrombocytopenia, unspecified: Secondary | ICD-10-CM

## 2020-01-31 DIAGNOSIS — O09522 Supervision of elderly multigravida, second trimester: Secondary | ICD-10-CM

## 2020-01-31 DIAGNOSIS — O10912 Unspecified pre-existing hypertension complicating pregnancy, second trimester: Secondary | ICD-10-CM

## 2020-01-31 DIAGNOSIS — O24112 Pre-existing diabetes mellitus, type 2, in pregnancy, second trimester: Secondary | ICD-10-CM

## 2020-01-31 DIAGNOSIS — O0992 Supervision of high risk pregnancy, unspecified, second trimester: Secondary | ICD-10-CM

## 2020-01-31 MED ORDER — PREPLUS 27-1 MG PO TABS: 1.0000 | ORAL_TABLET | Freq: Every day | ORAL | 13 refills | Status: DC

## 2020-01-31 MED ORDER — METFORMIN HCL 500 MG PO TABS: 500.0000 mg | ORAL_TABLET | Freq: Two times a day (BID) | ORAL | 5 refills | Status: DC

## 2020-01-31 NOTE — Progress Notes (Addendum)
PRENATAL VISIT NOTE  Subjective:  Terri Bradford is a 39 y.o. [id number] at [redacted]w[redacted]d being seen today for ongoing prenatal care.  She is currently monitored for the following issues for this high-risk pregnancy and has Type 2 diabetes mellitus in pregnancy, second trimester; Supervision of high risk pregnancy, antepartum; History of intrauterine fetal death in previous pregnancy; Chronic hypertension in pregnancy; History of cesarean section; AMA (advanced maternal age) multigravida 35+; and Thrombocytopenia affecting pregnancy (HCC) on their problem list.  Patient reports no complaints.  Contractions: Not present. Vag. Bleeding: None.   . Denies leaking of fluid.   The following portions of the patient's history were reviewed and updated as appropriate: allergies, current medications, past family history, past medical history, past social history, past surgical history and problem list.   Objective:   Vitals:   01/31/20 1334  BP: 131/89  Pulse: 99  Weight: 144 lb 4.8 oz (65.5 kg)    Fetal Status: Fetal Heart Rate (bpm): 150         General:  Alert, oriented and cooperative. Patient is in no acute distress.  Skin: Skin is warm and dry. No rash noted.   Cardiovascular: Normal heart rate noted  Respiratory: Normal respiratory effort, no problems with respiration noted  Abdomen: Soft, gravid, appropriate for gestational age.  Pain/Pressure: Absent     Pelvic: Cervical exam deferred        Extremities: Normal range of motion.  Edema: None  Mental Status: Normal mood and affect. Normal behavior. Normal judgment and thought content.   Assessment and Plan:  Pregnancy: [id number] at [redacted]w[redacted]d [place] was seen today for routine prenatal visit.  Diagnoses and all orders for this visit:  Supervision of high risk pregnancy, antepartum -     Prenatal Vit-Fe Fumarate-FA (PREPLUS) 27-1 MG TABS; Take 1 tablet by mouth daily. -     [place] MFM OB COMP + 14 WK; Future - RTC in 4 weeks; prefer in-person visits for  this patient to check sugars and BP's if needed  History of intrauterine fetal death in previous pregnancy History of cesarean section       -  Desires another TOLAC; hx VBAC x1  Multigravida of advanced maternal age in second trimester       - Scans per MFM   Chronic hypertension in pregnancy       - Reports taking Lisinopril prior to pregnancy but has not been on meds during pregnancy with normal BP's; discussed that she will likely needs anti-hypertensive        - Had not been consistently taking aspirin; discussed in detail and patient agreeable        - Baseline CMP, Pr/Cr WNL  Thrombocytopenia affecting pregnancy (HCC) -     CBC - Plt 100 on 6/3 (previously 154 on Jan 2021)  Type 2 diabetes mellitus in pregnancy, second trimester -     metFORMIN (GLUCOPHAGE) 500 MG tablet; Take 1 tablet (500 mg total) by mouth 2 (two) times daily with a meal. - Hgb A1c 6.1 indicating pre-diabetes but patient reports being diagnosed with T2DM several years ago and previously she was on insulin but then did not need it anymore  - POCT glucose 158 about 2 hours after lunch; initiated Metformin as above; will likely need insulin initiation at some point during pregnancy - Has not been checking sugars but recommended this and patient agreeable - Referred for diabetic education - Advised patient to monitor sugars and if consistently over given parameters,  to call us sooner than next appt for med adjustment. Advised to log in [employer] if able.  - Referral placed for fetal Echo  Preterm labor symptoms and general obstetric precautions including but not limited to vaginal bleeding, contractions, leaking of fluid and fetal movement were reviewed in detail with the patient. Please refer to After Visit Summary for other counseling recommendations.   Return in about 4 weeks (around 02/28/2020) for [facility]; in-person.  Future Appointments  Date Time Provider Department Center  02/25/2020  1:30 PM [facility] NURSE  [facility]  02/25/2020  1:45 PM [id number]  02/28/2020  1:30 PM Warden Fillers, MD [facility] None    Joselyn Arrow, MD

## 2020-01-31 NOTE — Progress Notes (Signed)
Pt. Needs pre natal vits

## 2020-01-31 NOTE — Patient Instructions (Signed)
Type 1 or Type 2 Diabetes Mellitus During Pregnancy, Diagnosis Type 1 diabetes (type 1 diabetes mellitus) and type 2 diabetes (type 2 diabetes mellitus) are long-term (chronic) diseases. Your diabetes may be caused by one or both of these problems:  Your pancreas does not make enough of a hormone called insulin.  Your pancreas does not respond in a normal way to insulin that it makes. Insulin lets sugars (glucose) go into cells in the body. This gives you energy. If you have diabetes, sugars cannot get into cells. This causes high blood sugar (hyperglycemia). If diabetes is treated, it may not hurt you or your baby. Your doctor will set treatment goals for you. In general, you should have these blood sugar levels:  After not eating for a long time (fasting): 95 mg/dL (5.3 mmol/L).  After meals (postprandial): ? One hour after a meal: at or below 140 mg/dL (7.8 mmol/L). ? Two hours after a meal: at or below 120 mg/dL (6.7 mmol/L).  A1c (hemoglobin A1c) level: 6-6.5%. Follow these instructions at home: Questions to ask your doctor  You may want to ask these questions: ? Do I need to meet with a diabetes educator? ? Where can I find a support group for people with diabetes? ? What equipment will I need to care for myself at home? ? What medicines do I need? When should I take them? ? How often do I need to check my blood sugar? ? What number can I call if I have questions? ? When is my next doctor's visit? General instructions  Take over-the-counter and prescription medicines only as told by your doctor.  Stay at a healthy weight during pregnancy.  Keep all follow-up visits as told by your doctor. This is important. Contact a doctor if:  Your blood sugar is at or above 240 mg/dL (13.3 mmol/L).  Your blood sugar is at or above 200 mg/dL (11.1 mmol/L), and you have ketones in your pee (urine).  You have been sick or have had a fever for 2 days or more and you are not getting  better.  You have any of these problems for more than 6 hours: ? You cannot eat or drink. ? You feel sick to your stomach (nauseous). ? You throw up (vomit). ? You have watery poop (diarrhea). Get help right away if:  Your blood sugar is lower than 54 mg/dL (3 mmol/L).  You get confused.  You have trouble: ? Thinking clearly. ? Breathing.  Your baby moves less than normal.  You have: ? Moderate or large ketone levels in your pee. ? Vaginal bleeding. ? Unusual fluid coming from your vagina. ? Early contractions. These may feel like tightness in your belly. Summary  Type 1 diabetes (type 1 diabetes mellitus) and type 2 diabetes (type 2 diabetes mellitus) are long-term (chronic) diseases.  If diabetes is treated, it may not hurt you or your baby.  Your doctor will set treatment goals for you. This information is not intended to replace advice given to you by your health care provider. Make sure you discuss any questions you have with your health care provider. Document Revised: 11/07/2018 Document Reviewed: 08/22/2015 Elsevier Patient Education  2020 Elsevier Inc.  

## 2020-01-31 NOTE — Addendum Note (Signed)
Addended by: Jerilynn Birkenhead on: 01/31/2020 02:49 PM   Modules accepted: Orders

## 2020-02-01 LAB — CBC
Hematocrit: 38.9 % (ref 34.0–46.6)
Hemoglobin: 12.9 g/dL (ref 11.1–15.9)
MCH: 31.2 pg (ref 26.6–33.0)
MCHC: 33.2 g/dL (ref 31.5–35.7)
MCV: 94 fL (ref 79–97)
Platelets: 91 10*3/uL — CL (ref 150–450)
RBC: 4.14 x10E6/uL (ref 3.77–5.28)
RDW: 12.9 % (ref 11.7–15.4)
WBC: 8.9 10*3/uL (ref 3.4–10.8)

## 2020-02-25 ENCOUNTER — Other Ambulatory Visit: Payer: Self-pay

## 2020-02-25 ENCOUNTER — Ambulatory Visit: Payer: Medicaid Other

## 2020-02-25 ENCOUNTER — Other Ambulatory Visit: Payer: Self-pay | Admitting: *Deleted

## 2020-02-25 ENCOUNTER — Other Ambulatory Visit: Payer: Self-pay | Admitting: Family Medicine

## 2020-02-25 ENCOUNTER — Ambulatory Visit: Payer: Medicaid Other | Attending: Obstetrics and Gynecology

## 2020-02-25 DIAGNOSIS — Z363 Encounter for antenatal screening for malformations: Secondary | ICD-10-CM

## 2020-02-25 DIAGNOSIS — Z3A19 19 weeks gestation of pregnancy: Secondary | ICD-10-CM

## 2020-02-25 DIAGNOSIS — O0990 Supervision of high risk pregnancy, unspecified, unspecified trimester: Secondary | ICD-10-CM

## 2020-02-25 DIAGNOSIS — O99112 Other diseases of the blood and blood-forming organs and certain disorders involving the immune mechanism complicating pregnancy, second trimester: Secondary | ICD-10-CM

## 2020-02-25 DIAGNOSIS — D696 Thrombocytopenia, unspecified: Secondary | ICD-10-CM

## 2020-02-25 DIAGNOSIS — O09292 Supervision of pregnancy with other poor reproductive or obstetric history, second trimester: Secondary | ICD-10-CM | POA: Diagnosis not present

## 2020-02-25 DIAGNOSIS — O10012 Pre-existing essential hypertension complicating pregnancy, second trimester: Secondary | ICD-10-CM

## 2020-02-25 DIAGNOSIS — O24112 Pre-existing diabetes mellitus, type 2, in pregnancy, second trimester: Secondary | ICD-10-CM

## 2020-02-25 DIAGNOSIS — O09522 Supervision of elderly multigravida, second trimester: Secondary | ICD-10-CM

## 2020-02-25 DIAGNOSIS — O09529 Supervision of elderly multigravida, unspecified trimester: Secondary | ICD-10-CM

## 2020-02-25 DIAGNOSIS — O34219 Maternal care for unspecified type scar from previous cesarean delivery: Secondary | ICD-10-CM | POA: Diagnosis not present

## 2020-02-26 ENCOUNTER — Other Ambulatory Visit: Payer: Medicaid Other

## 2020-02-28 ENCOUNTER — Other Ambulatory Visit: Payer: Self-pay

## 2020-02-28 ENCOUNTER — Encounter: Payer: Self-pay | Admitting: Obstetrics and Gynecology

## 2020-02-28 ENCOUNTER — Ambulatory Visit (INDEPENDENT_AMBULATORY_CARE_PROVIDER_SITE_OTHER): Payer: Medicaid Other | Admitting: Obstetrics and Gynecology

## 2020-02-28 VITALS — BP 130/87 | HR 85 | Wt 150.0 lb

## 2020-02-28 DIAGNOSIS — O10919 Unspecified pre-existing hypertension complicating pregnancy, unspecified trimester: Secondary | ICD-10-CM

## 2020-02-28 DIAGNOSIS — O09522 Supervision of elderly multigravida, second trimester: Secondary | ICD-10-CM

## 2020-02-28 DIAGNOSIS — O0990 Supervision of high risk pregnancy, unspecified, unspecified trimester: Secondary | ICD-10-CM

## 2020-02-28 DIAGNOSIS — Z8759 Personal history of other complications of pregnancy, childbirth and the puerperium: Secondary | ICD-10-CM

## 2020-02-28 DIAGNOSIS — O0992 Supervision of high risk pregnancy, unspecified, second trimester: Secondary | ICD-10-CM

## 2020-02-28 DIAGNOSIS — O99119 Other diseases of the blood and blood-forming organs and certain disorders involving the immune mechanism complicating pregnancy, unspecified trimester: Secondary | ICD-10-CM

## 2020-02-28 DIAGNOSIS — O10912 Unspecified pre-existing hypertension complicating pregnancy, second trimester: Secondary | ICD-10-CM

## 2020-02-28 DIAGNOSIS — D696 Thrombocytopenia, unspecified: Secondary | ICD-10-CM

## 2020-02-28 DIAGNOSIS — O24112 Pre-existing diabetes mellitus, type 2, in pregnancy, second trimester: Secondary | ICD-10-CM

## 2020-02-28 DIAGNOSIS — O99112 Other diseases of the blood and blood-forming organs and certain disorders involving the immune mechanism complicating pregnancy, second trimester: Secondary | ICD-10-CM

## 2020-02-28 DIAGNOSIS — Z98891 History of uterine scar from previous surgery: Secondary | ICD-10-CM

## 2020-02-28 DIAGNOSIS — Z3A19 19 weeks gestation of pregnancy: Secondary | ICD-10-CM

## 2020-02-28 NOTE — Patient Instructions (Signed)
Gestational Diabetes Mellitus, Self Care When you have gestational diabetes (gestational diabetes mellitus), you must make sure your blood sugar (glucose) stays in a healthy range. You can do this with:  Nutrition.  Exercise.  Lifestyle changes.  Medicines or insulin, if needed.  Support from your doctors and others. If you get treated for this condition, it may not hurt you or your unborn baby (fetus). If you do not get treated for this condition, it may cause problems that can hurt you or your unborn baby. If you get gestational diabetes, you are:  More likely to get it if you get pregnant again.  More likely to develop type 2 diabetes in the future. How to stay aware of blood sugar   Check your blood sugar every day while you are pregnant. Check it as often as told.  Call your doctor if your blood sugar is above your goal numbers for two tests in a row. Your doctor will set personal treatment goals for you. Generally, you should have these blood sugar levels:  Before meals, or after not eating for a long time (fasting or preprandial): at or below 95 mg/dL (5.3 mmol/L).  After meals (postprandial): ? One hour after a meal: at or below 140 mg/dL (7.8 mmol/L). ? Two hours after a meal: at or below 120 mg/dL (6.7 mmol/L).  A1c (hemoglobin A1c) level: 6-6.5%. How to manage high and low blood sugar Signs of high blood sugar High blood sugar is called hyperglycemia. Know the early signs of high blood sugar. Signs may include:  Feeling: ? Thirsty. ? Hungry. ? Very tired.  Needing to pee (urinate) more than usual.  Blurry vision. Signs of low blood sugar Low blood sugar is called hypoglycemia. This is when blood sugar is at or below 70 mg/dL (3.9 mmol/L). Signs may include:  Feeling: ? Hungry. ? Worried or nervous (anxious). ? Sweaty and clammy. ? Confused. ? Dizzy. ? Sleepy. ? Sick to your stomach (nauseous).  Having: ? A fast heartbeat. ? A headache. ? A change  in your vision. ? Tingling or no feeling (numbness) around your mouth, lips, or tongue. ? Jerky movements that you cannot control (seizure).  Having trouble with: ? Moving (coordination). ? Sleeping. ? Passing out (fainting). ? Getting upset easily (irritability). Treating low blood sugar To treat low blood sugar, eat or drink something sugary right away. If you can think clearly and swallow safely, follow the 15:15 rule:  Take 15 grams of a fast-acting carb (carbohydrate). Talk with your doctor about how much you should take.  Some fast-acting carbs are: ? Sugar tablets (glucose pills). Take 3-4 glucose pills. ? 6-8 pieces of hard candy. ? 4-6 oz (120-150 mL) of fruit juice. ? 4-6 oz (120-150 mL) of regular (not diet) soda. ? 1 Tbsp (15 mL) honey or sugar.  Check your blood sugar 15 minutes after you take the carb.  If your blood sugar is still at or below 70 mg/dL (3.9 mmol/L), take 15 grams of a carb again.  If your blood sugar does not go above 70 mg/dL (3.9 mmol/L) after 3 tries, get help right away.  After your blood sugar goes back to normal, eat a meal or a snack within 1 hour. Treating very low blood sugar If your blood sugar is at or below 54 mg/dL (3 mmol/L), you have very low blood sugar (severe hypoglycemia). This is an emergency. Do not wait to see if the symptoms will go away. Get medical help right  away. Call your local emergency services (911 in the [place] If you have very low blood sugar and you cannot eat or drink, you may need a glucagon shot (injection). A family member or friend should learn how to check your blood sugar and how to give you a glucagon shot. Ask your doctor if you need to have a glucagon shot kit at home. Follow these instructions at home: Medicine  Take your insulin and diabetes medicines as told.  If your doctor says you should take more or less insulin or medicines, do this exactly as told.  Do not run out of insulin or  medicines. Food   Make healthy food choices. These include: ? Chicken, fish, egg whites, and beans. ? Oats, whole wheat, bulgur, brown rice, quinoa, and millet. ? Fresh fruits and vegetables. ? Low-fat dairy products. ? Nuts, avocado, olive oil, and canola oil.  Meet with a food specialist (dietitian). He or she can help you make an eating plan that is right for you.  Follow instructions from your doctor about what you cannot eat or drink.  Drink enough fluid to keep your pee (urine) pale yellow.  Eat healthy snacks between healthy meals.  Keep track of carbs that you eat. Do this by reading food labels and learning food serving sizes.  Follow your sick day plan when you cannot eat or drink normally. Make this plan with your doctor so it is ready to use. Activity  Exercise for 30 or more minutes a day, or as much as your doctor recommends.  Talk with your doctor before you start a new exercise or activity. Your doctor may need to tell you to change: ? How much insulin or medicines you take. ? How much food you eat. Lifestyle  Do not drink alcohol.  Do not use any tobacco products. These include cigarettes, chewing tobacco, and e-cigarettes. If you need help quitting, ask your doctor.  Learn how to deal with stress. If you need help with this, ask your doctor. Body care  Stay up to date with your shots (immunizations).  Brush your teeth and gums two times a day. Floss one or more times a day.  Go to the dentist one or more times every 6 months.  Stay at a healthy weight while you are pregnant. General instructions  Take over-the-counter and prescription medicines only as told by your doctor.  Ask your doctor about risks of high blood pressure in pregnancy (preeclampsia and eclampsia).  Share your diabetes care plan with: ? Your work or school. ? People you live with.  Check your pee for ketones: ? When you are sick. ? As told by your doctor.  Carry a card or  wear jewelry that says you have diabetes.  Keep all follow-up visits as told by your doctor. This is important. Care after giving birth  Have your blood sugar checked 4-12 weeks after you give birth.  Get checked for diabetes one or more times during 3 years. Questions to ask your doctor  Do I need to meet with a diabetes educator?  Where can I find a support group for people with gestational diabetes? Where to find more information To learn more about diabetes, visit:  American Diabetes Association: www.diabetes.org  Centers for Disease Control and Prevention (CDC): www.cdc.gov Summary  Check your blood sugar (glucose) every day while you are pregnant. Check it as often as told.  Take your insulin and diabetes medicines as told.  Keep all follow-up visits as   told by your doctor. This is important.  Have your blood sugar checked 4-12 weeks after you give birth. This information is not intended to replace advice given to you by your health care provider. Make sure you discuss any questions you have with your health care provider. Document Revised: 01/09/2018 Document Reviewed: 08/22/2015 Elsevier Patient Education  2020 Elsevier Inc.  

## 2020-02-28 NOTE — Progress Notes (Signed)
PRENATAL VISIT NOTE  Subjective:  Terri Bradford is a 39 y.o. [id number] at [redacted]w[redacted]d being seen today for ongoing prenatal care.  She is currently monitored for the following issues for this high-risk pregnancy and has Type 2 diabetes mellitus in pregnancy, second trimester; Supervision of high risk pregnancy, antepartum; History of intrauterine fetal death in previous pregnancy; Chronic hypertension in pregnancy; History of cesarean section; AMA (advanced maternal age) multigravida 35+; and Thrombocytopenia affecting pregnancy (HCC) on their problem list.  Patient doing well with no acute concerns today. She reports no complaints.  Contractions: Not present. Vag. Bleeding: None.  Movement: Absent. Denies leaking of fluid.   Long discussion with patient regarding her blood sugars.  Explained how I cannot optimize care without seeing her blood sugar patterns.  Pt states she will try to do better in recording.  Advised pt to ask about freestyle libre at diabetes class if possible.  The following portions of the patient's history were reviewed and updated as appropriate: allergies, current medications, past family history, past medical history, past social history, past surgical history and problem list. Problem list updated.  Objective:   Vitals:   02/28/20 1332  BP: (!) 130/87  Pulse: 85  Weight: 150 lb (68 kg)    Fetal Status: Fetal Heart Rate (bpm): 148   Movement: Absent     General:  Alert, oriented and cooperative. Patient is in no acute distress.  Skin: Skin is warm and dry. No rash noted.   Cardiovascular: Normal heart rate noted  Respiratory: Normal respiratory effort, no problems with respiration noted  Abdomen: Soft, gravid, appropriate for gestational age.  Pain/Pressure: Absent     Pelvic: Cervical exam deferred        Extremities: Normal range of motion.  Edema: None  Mental Status:  Normal mood and affect. Normal behavior. Normal judgment and thought content.    OBSTETRICS  REPORT                       (Signed Final 02/25/2020 03:18 pm) ---------------------------------------------------------------------- Patient Info  ID #:       [id number]                          D.O.B.:  April 04, 1981 (39 yrs)  Name:       Terri Bradford                   Visit Date: 02/25/2020 01:48 pm ---------------------------------------------------------------------- Performed By  Attending:        Ma Rings MD         Ref. Address:     [address]                                                             [place], [place]  [address]  Performed By:     Lenise Arena        [facility]  Referred By:      Joselyn Arrow                    MD ---------------------------------------------------------------------- Orders  #  Description                           Code        Ordered By  1  [place] MFM OB DETAIL +14 WK               76811.01    CHELSEA FAIR ----------------------------------------------------------------------  #  Order #                     Accession #                Episode #  1  [id number]                   [id number]                 [id number] ---------------------------------------------------------------------- Indications  Advanced maternal age multigravida 26+,        O51.522  second trimester  Pre-existing diabetes, type 2, in pregnancy,   O24.112  second trimester oral meds  Hypertension - Chronic/Pre-existing ASA        O10.019  Poor obstetric history: Previous IUFD          O09.299  (stillbirth)  Thrombocytopenia affecting pregnancy,          O99.119, D69.6  antepartum  Encounter for antenatal  screening for          Z36.3  malformations (low risk NIPS, neg Horizon)  History of cesarean delivery, currently        O34.219  pregnant  [redacted] weeks gestation of pregnancy                Z3A.19 ---------------------------------------------------------------------- Vital Signs  Weight (lb): 144                               Height:        5'3"  BMI:         25.51 ----------------------------------------------------------------------  Fetal Evaluation  Num Of Fetuses:         1  Fetal Heart Rate(bpm):  153  Cardiac Activity:       Observed  Presentation:           Breech  Placenta:               Anterior  P. Cord Insertion:      Visualized, central  Amniotic Fluid  AFI FV:      Within normal limits                              Largest Pocket(cm)                              5.1 ---------------------------------------------------------------------- Biometry  BPD:      42.8  mm     G. Age:  19w 0d         36  %    CI:        72.88   %    70 - 86                                                          FL/HC:      17.2   %    16.1 - 18.3  HC:      159.4  mm     G. Age:  18w 5d         20  %    HC/AC:      1.15        1.09 - 1.39  AC:      138.3  mm     G. Age:  19w 2d         44  %    FL/BPD:     64.0   %  FL:       27.4  mm     G. Age:  18w 3d         14  %    FL/AC:      19.8   %    20 - 24  CER:      20.1  mm     G. Age:  19w 3d         53  %  NFT:       4.8  mm  LV:        5.3  mm  CM:        3.5  mm  Est. FW:     260  gm      0 lb 9 oz     22  % ---------------------------------------------------------------------- OB History  Gravidity:    4         Term:   3  Living:       2 ---------------------------------------------------------------------- Gestational Age  LMP:           19w 2d        Date:  10/13/19                 EDD:   07/19/20  U/S Today:     18w 6d  EDD:   07/22/20  Best:          19w 2d     Det. By:  LMP  (10/13/19)           EDD:   07/19/20 ---------------------------------------------------------------------- Anatomy  Cranium:               Appears normal         Aortic Arch:            Appears normal  Cavum:                 Appears normal         Ductal Arch:            Appears normal  Ventricles:            Appears normal         Diaphragm:              Appears normal  Choroid Plexus:        Appears normal         Stomach:                Appears normal, left                                                                        sided  Cerebellum:            Appears normal         Abdomen:                Appears normal  Posterior Fossa:       Appears normal         Abdominal Wall:         Appears nml (cord                                                                        insert, abd wall)  Nuchal Fold:           Appears normal         Cord Vessels:           Appears normal (3                                                                        vessel cord)  Face:                  Appears normal         Kidneys:                Appear normal                         (  orbits and profile)  Lips:                  Appears normal         Bladder:                Appears normal  Thoracic:              Appears normal         Spine:                  Not well visualized  Heart:                 Appears normal         Upper Extremities:      Appears normal                         (4CH, axis, and                         situs)  RVOT:                  Appears normal         Lower Extremities:      Possible Right                                                                        Clubfoot  LVOT:                  Appears normal  Other:  Fetus appears to be a female. Heels and 5th digit visualized. (Rt Club          Foot) 3VV visualized. Nasal bone visualized. Technically difficult due          to fetal position. ---------------------------------------------------------------------- Cervix Uterus Adnexa  Cervix   Length:           3.78  cm.  Normal appearance by transabdominal scan.  Uterus  No abnormality visualized.  Right Ovary  Within normal limits.  Left Ovary  Within normal limits.  Cul De Sac  No free fluid seen.  Adnexa  No abnormality visualized. ---------------------------------------------------------------------- Comments  This patient was seen for a detailed fetal anatomy scan due  to advanced maternal age.  She reports a history of chronic  hypertension that is not currently treated with any  medications and pregestational diabetes diagnosed about 10  years ago.  She reports that she is not currently treated with  any medications for her diabetes.  Her most recent  hemoglobin A1c was 6.1%.  The patient reports that she is  scheduled for diabetic teaching later this week.  Her  pregnancy has also been complicated by thrombocytopenia.  Her most recent platelet count was 91,000.  She denies any other significant past medical history and  denies any problems in her current pregnancy.  She had a cell free DNA test earlier in her pregnancy which  indicated a low risk for trisomy 41, 43, and 13. A female fetus is  predicted.  She was informed that the fetal growth and amniotic fluid  level were appropriate for her gestational age.  A possible right  clubfoot was noted on today's exam.  The  patient was advised that this finding was not visualized on all  of the images of the fetal feet indicating that the clubbed  appearance of the foot may be related to the fetal position, as  the fetus was breech and had the legs crossed.  She was  reassured that should a clubfoot be noted during her future  ultrasound exams, that this is a an abnormality that can be  repaired with good results after birth.  The patient was informed that anomalies may be missed due  to technical limitations. If the fetus is in a suboptimal position  or maternal habitus is increased, visualization of the fetus  in  the maternal uterus may be impaired.  The increased risk of fetal aneuploidy due to advanced  maternal age was discussed. Due to advanced maternal age,  the patient was offered and declined an amniocentesis today  for definitive diagnosis of fetal aneuploidy.  The implications and management of diabetes in pregnancy  was discussed in detail with the patient. She was advised that  our goals for her fingerstick values are fasting values of 90-95  or less and two-hour postprandials of 120 or less.  Should her  fingerstick values be above these values, she may have to be  started on insulin or metformin to help her achieve better  glycemic control. The patient was advised that getting her  fingerstick values as close to these goals as possible would  provide her with the most optimal obstetrical outcome.  The patient was advised that due to diabetes and chronic  hypertension in pregnancy, we will continue to follow her with  serial ultrasounds.  Due to pregestational diabetes, she was  referred for a fetal echocardiogram with pediatric cardiology.  Weekly fetal testing should be started at around 32 weeks.  Due to thrombocytopenia, she should continue to have her  platelet counts monitored on a monthly basis.  Should her  platelet counts continue to be low (<100,000) in the third  trimester, oral prednisone (40 to 60 mg daily) may be started  at around 30 to 32 weeks in an attempt to increase her  platelet counts so that she would be eligible for regional  anesthesia at the time of delivery.  A follow-up exam was scheduled in 4 weeks to reassess the  fetal feet and to assess the fetal growth. ----------------------------------------------------------------------                   Ma Rings, MD Electronically Signed Final Report   02/25/2020 03:18 pm    Assessment and Plan:  Pregnancy: [id number] at [redacted]w[redacted]d  1. Supervision of high risk pregnancy, antepartum Continue routine  care  2. Chronic hypertension in pregnancy Continue baby ASA, monitor BP closely  3. Type 2 diabetes mellitus in pregnancy, second trimester Pt strongly advised to start collecting blood sugar data, will reassess in 2 weeks  4. Thrombocytopenia affecting pregnancy (HCC) Cbc done today to check platelets - CBC  5. History of intrauterine fetal death in previous pregnancy Likely testing at 32 weeks  6. History of cesarean section Pt desires TOLAC  7. Multigravida of advanced maternal age in second trimester   Preterm labor symptoms and general obstetric precautions including but not limited to vaginal bleeding, contractions, leaking of fluid and fetal movement were reviewed in detail with the patient.  Please refer to After Visit Summary for other counseling recommendations.   Return in about 2 weeks (  around 03/13/2020) [facility].   Mariel AloeLawrence Gwendolin Briel, MD

## 2020-02-28 NOTE — Progress Notes (Signed)
Pt presents for routine OB visit. Pt does not have CBG reading with her today.  Diabetes class scheduled  Fetal Echo scheduled per pt  Declined blood work for AFP

## 2020-02-29 LAB — CBC
Hematocrit: 36.7 % (ref 34.0–46.6)
Hemoglobin: 12.6 g/dL (ref 11.1–15.9)
MCH: 33.1 pg — ABNORMAL HIGH (ref 26.6–33.0)
MCHC: 34.3 g/dL (ref 31.5–35.7)
MCV: 96 fL (ref 79–97)
Platelets: 95 10*3/uL — CL (ref 150–450)
RBC: 3.81 x10E6/uL (ref 3.77–5.28)
RDW: 13 % (ref 11.7–15.4)
WBC: 8.6 10*3/uL (ref 3.4–10.8)

## 2020-03-11 ENCOUNTER — Other Ambulatory Visit: Payer: Medicaid Other

## 2020-03-13 ENCOUNTER — Encounter: Payer: Medicaid Other | Admitting: Obstetrics and Gynecology

## 2020-03-24 ENCOUNTER — Encounter: Payer: Self-pay | Admitting: *Deleted

## 2020-03-24 ENCOUNTER — Other Ambulatory Visit: Payer: Self-pay

## 2020-03-24 ENCOUNTER — Other Ambulatory Visit: Payer: Self-pay | Admitting: *Deleted

## 2020-03-24 ENCOUNTER — Ambulatory Visit: Payer: Medicaid Other | Admitting: *Deleted

## 2020-03-24 ENCOUNTER — Ambulatory Visit: Payer: Medicaid Other | Attending: Obstetrics and Gynecology

## 2020-03-24 VITALS — BP 135/82 | HR 91

## 2020-03-24 DIAGNOSIS — Z363 Encounter for antenatal screening for malformations: Secondary | ICD-10-CM | POA: Diagnosis not present

## 2020-03-24 DIAGNOSIS — O09292 Supervision of pregnancy with other poor reproductive or obstetric history, second trimester: Secondary | ICD-10-CM | POA: Diagnosis not present

## 2020-03-24 DIAGNOSIS — O24112 Pre-existing diabetes mellitus, type 2, in pregnancy, second trimester: Secondary | ICD-10-CM

## 2020-03-24 DIAGNOSIS — O34219 Maternal care for unspecified type scar from previous cesarean delivery: Secondary | ICD-10-CM | POA: Diagnosis not present

## 2020-03-24 DIAGNOSIS — O99112 Other diseases of the blood and blood-forming organs and certain disorders involving the immune mechanism complicating pregnancy, second trimester: Secondary | ICD-10-CM

## 2020-03-24 DIAGNOSIS — D696 Thrombocytopenia, unspecified: Secondary | ICD-10-CM

## 2020-03-24 DIAGNOSIS — O09529 Supervision of elderly multigravida, unspecified trimester: Secondary | ICD-10-CM | POA: Diagnosis present

## 2020-03-24 DIAGNOSIS — O09522 Supervision of elderly multigravida, second trimester: Secondary | ICD-10-CM

## 2020-03-24 DIAGNOSIS — O10919 Unspecified pre-existing hypertension complicating pregnancy, unspecified trimester: Secondary | ICD-10-CM

## 2020-03-24 DIAGNOSIS — O10012 Pre-existing essential hypertension complicating pregnancy, second trimester: Secondary | ICD-10-CM

## 2020-03-24 DIAGNOSIS — Z3A23 23 weeks gestation of pregnancy: Secondary | ICD-10-CM

## 2020-03-25 ENCOUNTER — Encounter: Payer: Medicaid Other | Admitting: Obstetrics and Gynecology

## 2020-03-26 ENCOUNTER — Encounter: Payer: Self-pay | Admitting: Obstetrics and Gynecology

## 2020-03-26 ENCOUNTER — Other Ambulatory Visit: Payer: Self-pay

## 2020-03-26 ENCOUNTER — Ambulatory Visit (INDEPENDENT_AMBULATORY_CARE_PROVIDER_SITE_OTHER): Payer: Medicaid Other | Admitting: Obstetrics and Gynecology

## 2020-03-26 VITALS — BP 132/86 | HR 87 | Wt 153.5 lb

## 2020-03-26 DIAGNOSIS — Z98891 History of uterine scar from previous surgery: Secondary | ICD-10-CM

## 2020-03-26 DIAGNOSIS — O0990 Supervision of high risk pregnancy, unspecified, unspecified trimester: Secondary | ICD-10-CM

## 2020-03-26 DIAGNOSIS — D696 Thrombocytopenia, unspecified: Secondary | ICD-10-CM

## 2020-03-26 DIAGNOSIS — O09522 Supervision of elderly multigravida, second trimester: Secondary | ICD-10-CM

## 2020-03-26 DIAGNOSIS — O10919 Unspecified pre-existing hypertension complicating pregnancy, unspecified trimester: Secondary | ICD-10-CM

## 2020-03-26 DIAGNOSIS — O24112 Pre-existing diabetes mellitus, type 2, in pregnancy, second trimester: Secondary | ICD-10-CM

## 2020-03-26 DIAGNOSIS — O99119 Other diseases of the blood and blood-forming organs and certain disorders involving the immune mechanism complicating pregnancy, unspecified trimester: Secondary | ICD-10-CM

## 2020-03-26 DIAGNOSIS — Z8759 Personal history of other complications of pregnancy, childbirth and the puerperium: Secondary | ICD-10-CM

## 2020-03-26 NOTE — Progress Notes (Signed)
Patient reports fetal movement, denies pain. 

## 2020-03-26 NOTE — Progress Notes (Signed)
Subjective:  Terri Bradford is a 39 y.o. [id number] at [redacted]w[redacted]d being seen today for ongoing prenatal care.  She is currently monitored for the following issues for this high-risk pregnancy and has Type 2 diabetes mellitus in pregnancy, second trimester; Supervision of high risk pregnancy, antepartum; History of intrauterine fetal death in previous pregnancy; Chronic hypertension in pregnancy; History of cesarean section; AMA (advanced maternal age) multigravida 35+; and Thrombocytopenia affecting pregnancy (HCC) on their problem list.  Patient reports no complaints.  Contractions: Not present. Vag. Bleeding: None.  Movement: Present. Denies leaking of fluid.   The following portions of the patient's history were reviewed and updated as appropriate: allergies, current medications, past family history, past medical history, past social history, past surgical history and problem list. Problem list updated.  Objective:   Vitals:   03/26/20 1042  BP: 132/86  Pulse: 87  Weight: 153 lb 8 oz (69.6 kg)    Fetal Status: Fetal Heart Rate (bpm): 132   Movement: Present     General:  Alert, oriented and cooperative. Patient is in no acute distress.  Skin: Skin is warm and dry. No rash noted.   Cardiovascular: Normal heart rate noted  Respiratory: Normal respiratory effort, no problems with respiration noted  Abdomen: Soft, gravid, appropriate for gestational age. Pain/Pressure: Absent     Pelvic:  Cervical exam deferred        Extremities: Normal range of motion.  Edema: None  Mental Status: Normal mood and affect. Normal behavior. Normal judgment and thought content.   Urinalysis:      Assessment and Plan:  Pregnancy: [id number] at [redacted]w[redacted]d  1. Supervision of high risk pregnancy, antepartum Stable  2. Type 2 diabetes mellitus in pregnancy, second trimester CBG's ok but has not been checking on a regular basis. Discussed the importance of following diet and checking CBG's Paper log provided to pt  today Growth scans as per MFM Antenatal testing starting at 32 weeks Will have pt return in 2 weeks to check CBG's, may need to consider Metformin  3. Thrombocytopenia affecting pregnancy (HCC) - CBC  4. Multigravida of advanced maternal age in second trimester Stable  5. History of cesarean section Desires TOLAC Will need to sign consent at later appts  6. Chronic hypertension in pregnancy BP stable without meds Continue with serial growth scans as per MFM  7. History of intrauterine fetal death in previous pregnancy Growth scans as per MM  Preterm labor symptoms and general obstetric precautions including but not limited to vaginal bleeding, contractions, leaking of fluid and fetal movement were reviewed in detail with the patient. Please refer to After Visit Summary for other counseling recommendations.  Return in about 2 weeks (around 04/09/2020) for OB visit, face to face, MD only.   Hermina Staggers, MD

## 2020-03-27 LAB — CBC
Hematocrit: 36.1 % (ref 34.0–46.6)
Hemoglobin: 12.2 g/dL (ref 11.1–15.9)
MCH: 32 pg (ref 26.6–33.0)
MCHC: 33.8 g/dL (ref 31.5–35.7)
MCV: 95 fL (ref 79–97)
Platelets: 96 10*3/uL — CL (ref 150–450)
RBC: 3.81 x10E6/uL (ref 3.77–5.28)
RDW: 13.1 % (ref 11.7–15.4)
WBC: 9.7 10*3/uL (ref 3.4–10.8)

## 2020-04-10 ENCOUNTER — Encounter: Payer: Medicaid Other | Admitting: Obstetrics and Gynecology

## 2020-04-15 ENCOUNTER — Ambulatory Visit (INDEPENDENT_AMBULATORY_CARE_PROVIDER_SITE_OTHER): Payer: Medicaid Other | Admitting: Obstetrics and Gynecology

## 2020-04-15 ENCOUNTER — Encounter: Payer: Self-pay | Admitting: Obstetrics and Gynecology

## 2020-04-15 ENCOUNTER — Other Ambulatory Visit: Payer: Self-pay

## 2020-04-15 VITALS — BP 129/86 | HR 77 | Wt 156.0 lb

## 2020-04-15 DIAGNOSIS — O10919 Unspecified pre-existing hypertension complicating pregnancy, unspecified trimester: Secondary | ICD-10-CM

## 2020-04-15 DIAGNOSIS — O99119 Other diseases of the blood and blood-forming organs and certain disorders involving the immune mechanism complicating pregnancy, unspecified trimester: Secondary | ICD-10-CM

## 2020-04-15 DIAGNOSIS — O24112 Pre-existing diabetes mellitus, type 2, in pregnancy, second trimester: Secondary | ICD-10-CM

## 2020-04-15 DIAGNOSIS — Z98891 History of uterine scar from previous surgery: Secondary | ICD-10-CM

## 2020-04-15 DIAGNOSIS — O0990 Supervision of high risk pregnancy, unspecified, unspecified trimester: Secondary | ICD-10-CM

## 2020-04-15 DIAGNOSIS — Z8759 Personal history of other complications of pregnancy, childbirth and the puerperium: Secondary | ICD-10-CM

## 2020-04-15 DIAGNOSIS — D696 Thrombocytopenia, unspecified: Secondary | ICD-10-CM

## 2020-04-15 DIAGNOSIS — O09522 Supervision of elderly multigravida, second trimester: Secondary | ICD-10-CM

## 2020-04-15 NOTE — Progress Notes (Signed)
Pt states she is doing well.   Has sugar log with her today.

## 2020-04-15 NOTE — Progress Notes (Signed)
Subjective:  Terri Bradford is a 39 y.o. [id number] at [redacted]w[redacted]d being seen today for ongoing prenatal care.  She is currently monitored for the following issues for this high-risk pregnancy and has Type 2 diabetes mellitus in pregnancy, second trimester; Supervision of high risk pregnancy, antepartum; History of intrauterine fetal death in previous pregnancy; Chronic hypertension in pregnancy; History of cesarean section; AMA (advanced maternal age) multigravida 35+; and Thrombocytopenia affecting pregnancy (HCC) on their problem list.  GDM: Patient taking nothing for her diabetes.  She has not been taking the Metformin for the entire pregnancy.  She has not gone to the diabetic education appointment.  Reports no hypoglycemic episodes.  Fasting: 92-104 2hr PP: [id number]  Patient reports no complaints.  Contractions: Not present. Vag. Bleeding: None.  Movement: Present. Denies leaking of fluid.   The following portions of the patient's history were reviewed and updated as appropriate: allergies, current medications, past family history, past medical history, past social history, past surgical history and problem list. Problem list updated.  Objective:   Vitals:   04/15/20 1502  BP: 129/86  Pulse: 77  Weight: 156 lb (70.8 kg)    Fetal Status: Fetal Heart Rate (bpm): 150 Fundal Height: 26 cm Movement: Present     General:  Alert, oriented and cooperative. Patient is in no acute distress.  Skin: Skin is warm and dry. No rash noted.   Cardiovascular: Normal heart rate noted  Respiratory: Normal respiratory effort, no problems with respiration noted  Abdomen: Soft, gravid, appropriate for gestational age. Pain/Pressure: Absent     Pelvic: Vag. Bleeding: None     Cervical exam deferred        Extremities: Normal range of motion.  Edema: None  Mental Status: Normal mood and affect. Normal behavior. Normal judgment and thought content.   Urinalysis:      Assessment and Plan:  Pregnancy: [id number] at  [redacted]w[redacted]d  1. Supervision of high risk pregnancy, antepartum - stable  2. Type 2 diabetes mellitus in pregnancy, second trimester - Patient to start Metformin 500 mg bid  3. Thrombocytopenia affecting pregnancy (HCC) - Will repeat CBC at next appointment  4. Chronic hypertension in pregnancy - Currently, no blood pressure medication. - Patient to continue ASA.  5. History of cesarean section - Previous successful VBAC - Patient desires TOLAC - Abdominal pain precautions discussed.    6. History of intrauterine fetal death in previous pregnancy - Kick counts discussed.  7. Multigravida of advanced maternal age in second trimester   Preterm labor symptoms and general obstetric precautions including but not limited to vaginal bleeding, contractions, leaking of fluid and fetal movement were reviewed in detail with the patient. Please refer to After Visit Summary for other counseling recommendations.  Return in about 2 weeks (around 04/29/2020) for HROB.   Johnny Bridge, MD

## 2020-04-21 ENCOUNTER — Ambulatory Visit: Payer: Medicaid Other

## 2020-04-23 ENCOUNTER — Ambulatory Visit: Payer: Medicaid Other

## 2020-04-24 ENCOUNTER — Ambulatory Visit: Payer: Medicaid Other | Admitting: *Deleted

## 2020-04-24 ENCOUNTER — Other Ambulatory Visit: Payer: Self-pay | Admitting: *Deleted

## 2020-04-24 ENCOUNTER — Other Ambulatory Visit: Payer: Self-pay

## 2020-04-24 ENCOUNTER — Ambulatory Visit: Payer: Medicaid Other | Attending: Obstetrics

## 2020-04-24 ENCOUNTER — Other Ambulatory Visit: Payer: Self-pay | Admitting: Obstetrics

## 2020-04-24 VITALS — BP 137/82 | HR 89

## 2020-04-24 DIAGNOSIS — O34219 Maternal care for unspecified type scar from previous cesarean delivery: Secondary | ICD-10-CM

## 2020-04-24 DIAGNOSIS — O10919 Unspecified pre-existing hypertension complicating pregnancy, unspecified trimester: Secondary | ICD-10-CM | POA: Diagnosis present

## 2020-04-24 DIAGNOSIS — O09292 Supervision of pregnancy with other poor reproductive or obstetric history, second trimester: Secondary | ICD-10-CM

## 2020-04-24 DIAGNOSIS — O09522 Supervision of elderly multigravida, second trimester: Secondary | ICD-10-CM

## 2020-04-24 DIAGNOSIS — O24112 Pre-existing diabetes mellitus, type 2, in pregnancy, second trimester: Secondary | ICD-10-CM | POA: Diagnosis not present

## 2020-04-24 DIAGNOSIS — O99112 Other diseases of the blood and blood-forming organs and certain disorders involving the immune mechanism complicating pregnancy, second trimester: Secondary | ICD-10-CM

## 2020-04-24 DIAGNOSIS — O24119 Pre-existing diabetes mellitus, type 2, in pregnancy, unspecified trimester: Secondary | ICD-10-CM

## 2020-04-24 DIAGNOSIS — Z720 Tobacco use: Secondary | ICD-10-CM

## 2020-04-24 DIAGNOSIS — Z3A27 27 weeks gestation of pregnancy: Secondary | ICD-10-CM

## 2020-04-24 DIAGNOSIS — O99332 Smoking (tobacco) complicating pregnancy, second trimester: Secondary | ICD-10-CM

## 2020-04-24 DIAGNOSIS — D696 Thrombocytopenia, unspecified: Secondary | ICD-10-CM

## 2020-04-24 DIAGNOSIS — O10912 Unspecified pre-existing hypertension complicating pregnancy, second trimester: Secondary | ICD-10-CM

## 2020-04-24 DIAGNOSIS — O09529 Supervision of elderly multigravida, unspecified trimester: Secondary | ICD-10-CM

## 2020-04-24 DIAGNOSIS — Z362 Encounter for other antenatal screening follow-up: Secondary | ICD-10-CM

## 2020-04-24 DIAGNOSIS — O365920 Maternal care for other known or suspected poor fetal growth, second trimester, not applicable or unspecified: Secondary | ICD-10-CM

## 2020-04-29 ENCOUNTER — Encounter: Payer: Medicaid Other | Admitting: Obstetrics and Gynecology

## 2020-05-02 ENCOUNTER — Telehealth: Payer: Self-pay

## 2020-05-02 NOTE — Telephone Encounter (Signed)
Pt with CHTN reported an elevated BP reading 148/96. Consulted with MD, pt to retake BP Repeat BP reading 140/80 asymptomatic Pt to continue to monitor and if she gets another reading >140/90 and/or develops HA's, dizziness, increased swelling, or any other abnormal symptoms, to contact the office.

## 2020-05-02 NOTE — Telephone Encounter (Signed)
LVM for pt to cb.

## 2020-05-06 ENCOUNTER — Encounter: Payer: Medicaid Other | Admitting: Obstetrics and Gynecology

## 2020-05-07 ENCOUNTER — Other Ambulatory Visit: Payer: Self-pay

## 2020-05-07 ENCOUNTER — Ambulatory Visit: Payer: Medicaid Other | Admitting: *Deleted

## 2020-05-07 ENCOUNTER — Ambulatory Visit: Payer: Medicaid Other | Attending: Obstetrics and Gynecology

## 2020-05-07 VITALS — BP 137/95 | HR 76

## 2020-05-07 DIAGNOSIS — O09529 Supervision of elderly multigravida, unspecified trimester: Secondary | ICD-10-CM | POA: Diagnosis present

## 2020-05-07 DIAGNOSIS — D696 Thrombocytopenia, unspecified: Secondary | ICD-10-CM

## 2020-05-07 DIAGNOSIS — O365930 Maternal care for other known or suspected poor fetal growth, third trimester, not applicable or unspecified: Secondary | ICD-10-CM

## 2020-05-07 DIAGNOSIS — O24113 Pre-existing diabetes mellitus, type 2, in pregnancy, third trimester: Secondary | ICD-10-CM | POA: Diagnosis not present

## 2020-05-07 DIAGNOSIS — I10 Essential (primary) hypertension: Secondary | ICD-10-CM

## 2020-05-07 DIAGNOSIS — Z720 Tobacco use: Secondary | ICD-10-CM

## 2020-05-07 DIAGNOSIS — O09293 Supervision of pregnancy with other poor reproductive or obstetric history, third trimester: Secondary | ICD-10-CM

## 2020-05-07 DIAGNOSIS — O10919 Unspecified pre-existing hypertension complicating pregnancy, unspecified trimester: Secondary | ICD-10-CM

## 2020-05-07 DIAGNOSIS — O99113 Other diseases of the blood and blood-forming organs and certain disorders involving the immune mechanism complicating pregnancy, third trimester: Secondary | ICD-10-CM

## 2020-05-07 DIAGNOSIS — O99333 Smoking (tobacco) complicating pregnancy, third trimester: Secondary | ICD-10-CM | POA: Diagnosis not present

## 2020-05-07 DIAGNOSIS — O09523 Supervision of elderly multigravida, third trimester: Secondary | ICD-10-CM | POA: Diagnosis not present

## 2020-05-07 DIAGNOSIS — O10013 Pre-existing essential hypertension complicating pregnancy, third trimester: Secondary | ICD-10-CM

## 2020-05-07 DIAGNOSIS — O403XX0 Polyhydramnios, third trimester, not applicable or unspecified: Secondary | ICD-10-CM

## 2020-05-07 DIAGNOSIS — O34219 Maternal care for unspecified type scar from previous cesarean delivery: Secondary | ICD-10-CM

## 2020-05-07 DIAGNOSIS — Z3A29 29 weeks gestation of pregnancy: Secondary | ICD-10-CM

## 2020-05-08 ENCOUNTER — Ambulatory Visit (INDEPENDENT_AMBULATORY_CARE_PROVIDER_SITE_OTHER): Payer: Medicaid Other | Admitting: Obstetrics and Gynecology

## 2020-05-08 ENCOUNTER — Other Ambulatory Visit: Payer: Self-pay | Admitting: *Deleted

## 2020-05-08 ENCOUNTER — Encounter: Payer: Self-pay | Admitting: Obstetrics and Gynecology

## 2020-05-08 VITALS — BP 138/90 | HR 81 | Wt 160.2 lb

## 2020-05-08 DIAGNOSIS — D696 Thrombocytopenia, unspecified: Secondary | ICD-10-CM

## 2020-05-08 DIAGNOSIS — O99119 Other diseases of the blood and blood-forming organs and certain disorders involving the immune mechanism complicating pregnancy, unspecified trimester: Secondary | ICD-10-CM

## 2020-05-08 DIAGNOSIS — O10919 Unspecified pre-existing hypertension complicating pregnancy, unspecified trimester: Secondary | ICD-10-CM

## 2020-05-08 DIAGNOSIS — O0990 Supervision of high risk pregnancy, unspecified, unspecified trimester: Secondary | ICD-10-CM

## 2020-05-08 DIAGNOSIS — Z98891 History of uterine scar from previous surgery: Secondary | ICD-10-CM

## 2020-05-08 DIAGNOSIS — O24112 Pre-existing diabetes mellitus, type 2, in pregnancy, second trimester: Secondary | ICD-10-CM

## 2020-05-08 DIAGNOSIS — O09523 Supervision of elderly multigravida, third trimester: Secondary | ICD-10-CM

## 2020-05-08 MED ORDER — LABETALOL HCL 200 MG PO TABS: 200.0000 mg | ORAL_TABLET | Freq: Two times a day (BID) | ORAL | 3 refills | Status: DC

## 2020-05-08 NOTE — Progress Notes (Signed)
   PRENATAL VISIT NOTE  Subjective:  Terri Bradford is a 39 y.o. [id number] at [redacted]w[redacted]d being seen today for ongoing prenatal care.  She is currently monitored for the following issues for this high-risk pregnancy and has Type 2 diabetes mellitus in pregnancy, second trimester; Supervision of high risk pregnancy, antepartum; History of intrauterine fetal death in previous pregnancy; Chronic hypertension in pregnancy; History of cesarean section; AMA (advanced maternal age) multigravida 35+; and Thrombocytopenia affecting pregnancy (HCC) on their problem list.  Patient reports no complaints.  Contractions: Not present. Vag. Bleeding: None.  Movement: Present. Denies leaking of fluid.   The following portions of the patient's history were reviewed and updated as appropriate: allergies, current medications, past family history, past medical history, past social history, past surgical history and problem list.   Objective:   Vitals:   05/08/20 1525 05/08/20 1534  BP: (!) 156/98 138/90  Pulse: 81   Weight: 160 lb 3.4 oz (72.7 kg)     Fetal Status: Fetal Heart Rate (bpm): 138   Movement: Present     General:  Alert, oriented and cooperative. Patient is in no acute distress.  Skin: Skin is warm and dry. No rash noted.   Cardiovascular: Normal heart rate noted  Respiratory: Normal respiratory effort, no problems with respiration noted  Abdomen: Soft, gravid, appropriate for gestational age.  Pain/Pressure: Absent     Pelvic: Cervical exam deferred        Extremities: Normal range of motion.  Edema: None  Mental Status: Normal mood and affect. Normal behavior. Normal judgment and thought content.   Assessment and Plan:  Pregnancy: [id number] at [redacted]w[redacted]d 1. Supervision of high risk pregnancy, antepartum Patient is doing well without complaints Third trimester labs today Patient undecided on contraception  2. Type 2 diabetes mellitus in pregnancy, second trimester Patient is taking metformin but has  not been checking CBGs Discussed importance of monitoring CBGs and to keep euglycemia during pregnancy Follow up scans with MFM A1c ordered  3. Multigravida of advanced maternal age in third trimester   4. Chronic hypertension in pregnancy Patient previously on lisinopril prior to pregnancy Patient denies any symptoms of preeclampsia Rx labetalol 200 BID provided Continue ASA Baseline labs today  5. History of cesarean section Patient desires another TOLAC Patient to sign forms at next visit  6. Thrombocytopenia affecting pregnancy (HCC) CBC today  Preterm labor symptoms and general obstetric precautions including but not limited to vaginal bleeding, contractions, leaking of fluid and fetal movement were reviewed in detail with the patient. Please refer to After Visit Summary for other counseling recommendations.   Return in about 2 weeks (around 05/22/2020) for in person, ROB, High risk.  Future Appointments  Date Time Provider Department Center  05/08/2020  3:45 PM Nohealani Medinger, MD [facility] None  05/14/2020  1:00 PM [facility] N[facility]SE [facility]  05/14/2020  1:15 PM [facility] US2 [facility]  05/22/2020  2:30 PM [facility]SE [facility]  05/22/2020  2:45 PM [facility] US4 [facility]  05/29/2020  1:15 PM [facility]URSE [facility]  05/29/2020  1:30 PM [facility]3 [facility]    Catalina Antigua, MD

## 2020-05-08 NOTE — Progress Notes (Signed)
Pt presents for ROB Dopplers completed yesterday  Pt not consistently checking sugars but reports her fasting levels are in the 100s

## 2020-05-09 LAB — COMPREHENSIVE METABOLIC PANEL
ALT: 9 IU/L (ref 0–32)
AST: 15 IU/L (ref 0–40)
Albumin/Globulin Ratio: 1.6 (ref 1.2–2.2)
Albumin: 3.6 g/dL — ABNORMAL LOW (ref 3.8–4.8)
Alkaline Phosphatase: 116 IU/L (ref 44–121)
BUN/Creatinine Ratio: 8 — ABNORMAL LOW (ref 9–23)
BUN: 4 mg/dL — ABNORMAL LOW (ref 6–24)
Bilirubin Total: 0.5 mg/dL (ref 0.0–1.2)
CO2: 20 mmol/L (ref 20–29)
Calcium: 8.4 mg/dL — ABNORMAL LOW (ref 8.7–10.2)
Chloride: 110 mmol/L — ABNORMAL HIGH (ref 96–106)
Creatinine, Ser: 0.52 mg/dL — ABNORMAL LOW (ref 0.57–1.00)
GFR calc Af Amer: 137 mL/min/{1.73_m2} (ref >59)
GFR calc non Af Amer: 119 mL/min/{1.73_m2} (ref >59)
Globulin, Total: 2.3 g/dL (ref 1.5–4.5)
Glucose: 76 mg/dL (ref 65–99)
Potassium: 4 mmol/L (ref 3.5–5.2)
Sodium: 142 mmol/L (ref 134–144)
Total Protein: 5.9 g/dL — ABNORMAL LOW (ref 6.0–8.5)

## 2020-05-09 LAB — RPR: RPR Ser Ql: NONREACTIVE

## 2020-05-09 LAB — CBC
Hematocrit: 33.8 % — ABNORMAL LOW (ref 34.0–46.6)
Hemoglobin: 11.8 g/dL (ref 11.1–15.9)
MCH: 33.3 pg — ABNORMAL HIGH (ref 26.6–33.0)
MCHC: 34.9 g/dL (ref 31.5–35.7)
MCV: 96 fL (ref 79–97)
Platelets: 81 10*3/uL — CL (ref 150–450)
RBC: 3.54 x10E6/uL — ABNORMAL LOW (ref 3.77–5.28)
RDW: 13.4 % (ref 11.7–15.4)
WBC: 6.8 10*3/uL (ref 3.4–10.8)

## 2020-05-09 LAB — PROTEIN / CREATININE RATIO, URINE
Creatinine, Urine: 132.4 mg/dL
Protein, Ur: 219.6 mg/dL
Protein/Creat Ratio: 1659 mg/g creat — ABNORMAL HIGH (ref 0–200)

## 2020-05-09 LAB — HEMOGLOBIN A1C
Est. average glucose Bld gHb Est-mCnc: 91 mg/dL
Hgb A1c MFr Bld: 4.8 % (ref 4.8–5.6)

## 2020-05-09 LAB — HIV ANTIBODY (ROUTINE TESTING W REFLEX): HIV Screen 4th Generation wRfx: NONREACTIVE

## 2020-05-14 ENCOUNTER — Other Ambulatory Visit: Payer: Self-pay

## 2020-05-14 ENCOUNTER — Ambulatory Visit: Payer: Medicaid Other | Admitting: *Deleted

## 2020-05-14 ENCOUNTER — Ambulatory Visit: Payer: Medicaid Other | Attending: Obstetrics and Gynecology

## 2020-05-14 VITALS — BP 135/85 | HR 91

## 2020-05-14 DIAGNOSIS — O24113 Pre-existing diabetes mellitus, type 2, in pregnancy, third trimester: Secondary | ICD-10-CM | POA: Diagnosis not present

## 2020-05-14 DIAGNOSIS — D696 Thrombocytopenia, unspecified: Secondary | ICD-10-CM

## 2020-05-14 DIAGNOSIS — Z720 Tobacco use: Secondary | ICD-10-CM

## 2020-05-14 DIAGNOSIS — O10919 Unspecified pre-existing hypertension complicating pregnancy, unspecified trimester: Secondary | ICD-10-CM | POA: Insufficient documentation

## 2020-05-14 DIAGNOSIS — O99113 Other diseases of the blood and blood-forming organs and certain disorders involving the immune mechanism complicating pregnancy, third trimester: Secondary | ICD-10-CM

## 2020-05-14 DIAGNOSIS — Z3A30 30 weeks gestation of pregnancy: Secondary | ICD-10-CM

## 2020-05-14 DIAGNOSIS — O09523 Supervision of elderly multigravida, third trimester: Secondary | ICD-10-CM | POA: Diagnosis not present

## 2020-05-14 DIAGNOSIS — O09529 Supervision of elderly multigravida, unspecified trimester: Secondary | ICD-10-CM | POA: Diagnosis present

## 2020-05-14 DIAGNOSIS — Z362 Encounter for other antenatal screening follow-up: Secondary | ICD-10-CM

## 2020-05-14 DIAGNOSIS — Z7984 Long term (current) use of oral hypoglycemic drugs: Secondary | ICD-10-CM

## 2020-05-14 DIAGNOSIS — O09293 Supervision of pregnancy with other poor reproductive or obstetric history, third trimester: Secondary | ICD-10-CM

## 2020-05-14 DIAGNOSIS — O365930 Maternal care for other known or suspected poor fetal growth, third trimester, not applicable or unspecified: Secondary | ICD-10-CM

## 2020-05-14 DIAGNOSIS — O10913 Unspecified pre-existing hypertension complicating pregnancy, third trimester: Secondary | ICD-10-CM | POA: Diagnosis not present

## 2020-05-14 DIAGNOSIS — O99333 Smoking (tobacco) complicating pregnancy, third trimester: Secondary | ICD-10-CM | POA: Diagnosis not present

## 2020-05-14 DIAGNOSIS — O403XX0 Polyhydramnios, third trimester, not applicable or unspecified: Secondary | ICD-10-CM

## 2020-05-15 ENCOUNTER — Other Ambulatory Visit: Payer: Self-pay | Admitting: *Deleted

## 2020-05-15 DIAGNOSIS — O10919 Unspecified pre-existing hypertension complicating pregnancy, unspecified trimester: Secondary | ICD-10-CM

## 2020-05-22 ENCOUNTER — Ambulatory Visit: Payer: Medicaid Other

## 2020-05-22 ENCOUNTER — Ambulatory Visit: Payer: Medicaid Other | Attending: Obstetrics and Gynecology

## 2020-05-26 ENCOUNTER — Encounter: Payer: Medicaid Other | Admitting: Obstetrics and Gynecology

## 2020-05-28 ENCOUNTER — Ambulatory Visit (INDEPENDENT_AMBULATORY_CARE_PROVIDER_SITE_OTHER): Payer: Medicaid Other | Admitting: Obstetrics and Gynecology

## 2020-05-28 ENCOUNTER — Other Ambulatory Visit: Payer: Self-pay

## 2020-05-28 ENCOUNTER — Encounter: Payer: Self-pay | Admitting: Obstetrics and Gynecology

## 2020-05-28 VITALS — BP 134/83 | HR 85 | Wt 160.0 lb

## 2020-05-28 DIAGNOSIS — Z98891 History of uterine scar from previous surgery: Secondary | ICD-10-CM

## 2020-05-28 DIAGNOSIS — O09523 Supervision of elderly multigravida, third trimester: Secondary | ICD-10-CM

## 2020-05-28 DIAGNOSIS — Z8759 Personal history of other complications of pregnancy, childbirth and the puerperium: Secondary | ICD-10-CM

## 2020-05-28 DIAGNOSIS — O99119 Other diseases of the blood and blood-forming organs and certain disorders involving the immune mechanism complicating pregnancy, unspecified trimester: Secondary | ICD-10-CM

## 2020-05-28 DIAGNOSIS — D696 Thrombocytopenia, unspecified: Secondary | ICD-10-CM

## 2020-05-28 DIAGNOSIS — Z3A32 32 weeks gestation of pregnancy: Secondary | ICD-10-CM | POA: Insufficient documentation

## 2020-05-28 DIAGNOSIS — O10919 Unspecified pre-existing hypertension complicating pregnancy, unspecified trimester: Secondary | ICD-10-CM

## 2020-05-28 DIAGNOSIS — O24112 Pre-existing diabetes mellitus, type 2, in pregnancy, second trimester: Secondary | ICD-10-CM

## 2020-05-28 DIAGNOSIS — O0990 Supervision of high risk pregnancy, unspecified, unspecified trimester: Secondary | ICD-10-CM

## 2020-05-28 NOTE — Progress Notes (Signed)
   PRENATAL VISIT NOTE  Subjective:  Terri Bradford is a 39 y.o. [id number] at [redacted]w[redacted]d being seen today for ongoing prenatal care.  She is currently monitored for the following issues for this high-risk pregnancy and has Type 2 diabetes mellitus in pregnancy, second trimester; Supervision of high risk pregnancy, antepartum; History of intrauterine fetal death in previous pregnancy; Chronic hypertension in pregnancy; History of cesarean section; AMA (advanced maternal age) multigravida 35+; Thrombocytopenia affecting pregnancy (HCC); and [redacted] weeks gestation of pregnancy on their problem list.  Patient doing well with no acute concerns today. She reports no complaints.  Contractions: Not present. Vag. Bleeding: None.  Movement: Present. Denies leaking of fluid.   Spoke in detail with pt the importance of her doppler appointments and blood sugar testing.  Glucose monitoring sheets given.  She does note compliance with antihypertensives and baby ASA  The following portions of the patient's history were reviewed and updated as appropriate: allergies, current medications, past family history, past medical history, past social history, past surgical history and problem list. Problem list updated.  Objective:   Vitals:   05/28/20 1535  BP: 134/83  Pulse: 85  Weight: 160 lb (72.6 kg)    Fetal Status: Fetal Heart Rate (bpm): 136   Movement: Present     General:  Alert, oriented and cooperative. Patient is in no acute distress.  Skin: Skin is warm and dry. No rash noted.   Cardiovascular: Normal heart rate noted  Respiratory: Normal respiratory effort, no problems with respiration noted  Abdomen: Soft, gravid, appropriate for gestational age.  Pain/Pressure: Absent     Pelvic: Cervical exam deferred        Extremities: Normal range of motion.  Edema: Trace  Mental Status:  Normal mood and affect. Normal behavior. Normal judgment and thought content.   Assessment and Plan:  Pregnancy: G4P3002 at  [redacted]w[redacted]d  1. [redacted] weeks gestation of pregnancy   2. Supervision of high risk pregnancy, antepartum   3. Chronic hypertension in pregnancy BP 134/83, no s/sx of PIH  4. Type 2 diabetes mellitus in pregnancy, second trimester Cannot follow blood sugars as pt did not bring them in.  Pt advised I need fasting and postprandial blood sugar to monitor if her treatments are effective.  Good blood sugar control is important for fetal safety.  5. Thrombocytopenia affecting pregnancy (HCC) Platelets slowly decreasing over time 95=> 96=> 81, will check with heme/onc if any interventions needed - Ambulatory referral to Hematology  6. History of intrauterine fetal death in previous pregnancy Weekly dopplers with MFM and BPP  7. History of cesarean section PT currently desires VBAC  8. Multigravida of advanced maternal age in third trimester   Preterm labor symptoms and general obstetric precautions including but not limited to vaginal bleeding, contractions, leaking of fluid and fetal movement were reviewed in detail with the patient.  Please refer to After Visit Summary for other counseling recommendations.   Return in about 1 week (around 06/04/2020) for [facility], in person.   Mariel Aloe, MD

## 2020-05-28 NOTE — Progress Notes (Signed)
Pt presents for ROB  MFM visit scheduled tomorrow Pt did not bring CBG log; however, reports fasting 98, pc 115

## 2020-05-28 NOTE — Patient Instructions (Signed)
Type 1 or Type 2 Diabetes Mellitus During Pregnancy, Self Care When you have type 1 or type 2 diabetes (diabetes mellitus), you must keep your blood sugar (glucose) in a healthy range. You can do this with:  Nutrition.  Exercise.  Lifestyle changes.  Insulin or medicines, if needed.  Support from your doctors and others. If diabetes is treated, it is unlikely to cause problems for the mother or baby. If it is not treated, it may cause problems that can be harmful to the mother and baby. How to stay aware of blood sugar   Check your blood sugar every day, as often as told.  Call your doctor if your blood sugar is above your goal numbers for 2 tests in a row.  Have your A1c (hemoglobin A1c) level checked at least two times a year. Have it checked more often if your doctor tells you to do that. Your doctor will set personal treatment goals for you. In general, you should have these blood sugar levels:  After not eating for a long time (fasting): 95 mg/dL (5.3 mmol/L).  After meals (postprandial): ? One hour after a meal: at or below 140 mg/dL (7.8 mmol/L). ? Two hours after a meal: at or below 120 mg/dL (6.7 mmol/L).  A1c level: 6-6.5%. How to manage high and low blood sugar Signs of high blood sugar High blood sugar is called hyperglycemia. Know the early signs of high blood sugar. Signs may include:  Feeling: ? Thirsty. ? Hungry. ? Very tired.  Needing to pee (urinate) more than usual.  Blurry vision. Signs of low blood sugar Low blood sugar is called hypoglycemia. This is when blood sugar is at or below 70 mg/dL (3.9 mmol/L). Signs may include:  Feeling: ? Hungry. ? Worried or nervous (anxious). ? Sweaty and clammy. ? Confused. ? Dizzy. ? Sleepy. ? Sick to your stomach (nauseous).  Having: ? A fast heartbeat. ? A headache. ? A change in your vision. ? Tingling or no feeling (numbness) around your mouth, lips, or tongue. ? Jerky movements that you cannot  control (seizure).  Having trouble with: ? Moving (coordination). ? Sleeping. ? Passing out (fainting). ? Getting upset easily (irritability). Treating low blood sugar To treat low blood sugar, eat or drink something sugary right away. If you can think clearly and swallow safely, follow the 15:15 rule:  Take 15 grams of a fast-acting carb (carbohydrate). Some fast-acting carbs are: ? 1 tube of glucose gel. ? 3 sugar tablets (glucose pills). ? 6-8 pieces of hard candy. ? 4 oz (120 mL) of fruit juice. ? 4 oz (120 mL) of regular (not diet) soda.  Check your blood sugar 15 minutes after you take the carb.  If your blood sugar is still at or below 70 mg/dL (3.9 mmol/L), take 15 grams of a carb again.  If your blood sugar does not go above 70 mg/dL (3.9 mmol/L) after 3 tries, get help right away.  After your blood sugar goes back to normal, eat a meal or a snack within 1 hour. Treating very low blood sugar If your blood sugar is at or below 54 mg/dL (3 mmol/L), you have very low blood sugar (severe hypoglycemia). This is an emergency. Do not wait to see if the symptoms will go away. Get medical help right away. Call your local emergency services (911 in the [place] If you have very low blood sugar and you cannot eat or drink, you may need a glucagon shot (injection). A  family member or friend should learn how to check your blood sugar and how to give you a glucagon shot. Ask your doctor if you need to have a glucagon shot kit at home. Follow these instructions at home: Medicine  Take insulin and diabetes medicines as told.  If your doctor says you should take more or less insulin and medicines, do this exactly as told.  Do not run out of insulin or medicines. Having diabetes can put you at risk for other long-term conditions. These include heart disease and kidney disease. Your doctor may prescribe medicines to prevent these problems. Food   Make healthy food choices. These  include: ? Chicken, fish, egg whites, and beans. ? Oats, whole wheat, bulgur, brown rice, quinoa, and millet. ? Fresh fruits and vegetables. ? Low-fat dairy products. ? Nuts, avocado, olive oil, and canola oil.  Meet with a food specialist (dietitian). He or she can help you make an eating plan that is right for you.  Follow instructions from your doctor about what you cannot eat or drink.  Drink enough fluid to keep your pee (urine) pale yellow.  Eat healthy snacks between healthy meals.  Keep track of the carbs you eat. Do this by reading food labels and learning food serving sizes.  Follow your sick day plan when you cannot eat or drink normally. Make this plan with your doctor so it is ready to use. Activity  Exercise for 30 minutes or more a day during your pregnancy or as much as told by your doctor.  Talk with your doctor before you start a new exercise or activity. Your doctor may need to tell you to change: ? How much insulin or medicines you take. ? How much food you eat. Lifestyle  Do not drink alcohol.  Do not use any tobacco products, such as cigarettes, chewing tobacco, and e-cigarettes. If you need help quitting, ask your doctor.  Learn how to deal with stress. If you need help with this, ask your doctor. Body care  Stay up to date with your shots (immunizations).  Get an eye exam during your first trimester.  Check your skin and feet every day. Check for cuts, bruises, redness, blisters, or sores.  Get regular foot exams as told by your doctor.  Brush your teeth and gums two times a day. Floss one or more times a day.  Go to the dentist one or more times every 6 months.  Stay at a healthy weight during your pregnancy. General instructions  Take over-the-counter and prescription medicines only as told by your doctor.  Talk with your doctor about your risk for high blood pressure during pregnancy (preeclampsia or eclampsia).  Share your diabetes care  plan with: ? Your work or school. ? People you live with.  Check your pee for ketones: ? When you are sick. ? As told by your doctor.  Carry a card or wear jewelry that says that you have diabetes.  Keep all follow-up visits with your doctor. This is important. Questions to ask your doctor  Do I need to meet with a diabetes educator?  Where can I find a support group for people with diabetes? Where to find more information To learn more about diabetes, visit:  American Diabetes Association: www.diabetes.org  American Association of Diabetes Educators (AADE): www.diabeteseducator.org Summary  When you have type 1 or type 2 diabetes (diabetes mellitus), you must keep your blood sugar (glucose) in a healthy range.  Check your blood sugar every   day, as often as told.  Take insulin and diabetes medicines as told.  Keep all follow-up visits as told by your doctor. This is important. This information is not intended to replace advice given to you by your health care provider. Make sure you discuss any questions you have with your health care provider. Document Revised: 11/07/2018 Document Reviewed: 08/22/2015 Elsevier Patient Education  2020 [employer].

## 2020-05-29 ENCOUNTER — Encounter: Payer: Self-pay | Admitting: *Deleted

## 2020-05-29 ENCOUNTER — Ambulatory Visit: Payer: Medicaid Other | Admitting: *Deleted

## 2020-05-29 ENCOUNTER — Ambulatory Visit: Payer: Medicaid Other | Attending: Obstetrics and Gynecology

## 2020-05-29 VITALS — BP 137/92 | HR 79

## 2020-05-29 DIAGNOSIS — O09523 Supervision of elderly multigravida, third trimester: Secondary | ICD-10-CM

## 2020-05-29 DIAGNOSIS — O99333 Smoking (tobacco) complicating pregnancy, third trimester: Secondary | ICD-10-CM

## 2020-05-29 DIAGNOSIS — E669 Obesity, unspecified: Secondary | ICD-10-CM

## 2020-05-29 DIAGNOSIS — O99113 Other diseases of the blood and blood-forming organs and certain disorders involving the immune mechanism complicating pregnancy, third trimester: Secondary | ICD-10-CM

## 2020-05-29 DIAGNOSIS — O10913 Unspecified pre-existing hypertension complicating pregnancy, third trimester: Secondary | ICD-10-CM

## 2020-05-29 DIAGNOSIS — I10 Essential (primary) hypertension: Secondary | ICD-10-CM | POA: Insufficient documentation

## 2020-05-29 DIAGNOSIS — O403XX0 Polyhydramnios, third trimester, not applicable or unspecified: Secondary | ICD-10-CM | POA: Diagnosis not present

## 2020-05-29 DIAGNOSIS — E119 Type 2 diabetes mellitus without complications: Secondary | ICD-10-CM

## 2020-05-29 DIAGNOSIS — O09293 Supervision of pregnancy with other poor reproductive or obstetric history, third trimester: Secondary | ICD-10-CM

## 2020-05-29 DIAGNOSIS — O34219 Maternal care for unspecified type scar from previous cesarean delivery: Secondary | ICD-10-CM

## 2020-05-29 DIAGNOSIS — O24113 Pre-existing diabetes mellitus, type 2, in pregnancy, third trimester: Secondary | ICD-10-CM

## 2020-05-29 DIAGNOSIS — O10919 Unspecified pre-existing hypertension complicating pregnancy, unspecified trimester: Secondary | ICD-10-CM

## 2020-05-29 DIAGNOSIS — Z720 Tobacco use: Secondary | ICD-10-CM

## 2020-05-29 DIAGNOSIS — Z362 Encounter for other antenatal screening follow-up: Secondary | ICD-10-CM

## 2020-05-29 DIAGNOSIS — O365930 Maternal care for other known or suspected poor fetal growth, third trimester, not applicable or unspecified: Secondary | ICD-10-CM

## 2020-05-29 DIAGNOSIS — Z3A32 32 weeks gestation of pregnancy: Secondary | ICD-10-CM

## 2020-05-29 DIAGNOSIS — D696 Thrombocytopenia, unspecified: Secondary | ICD-10-CM

## 2020-05-30 ENCOUNTER — Telehealth: Payer: Self-pay | Admitting: Hematology

## 2020-05-30 NOTE — Telephone Encounter (Signed)
Received a new [facility] thrombocytopenia after pregnancy. Terri Bradford returned my call and has been scheduled to see Dr. Candise Che on 11/17 at 11am. Pt aware to arrive 15 minutes early.

## 2020-06-04 ENCOUNTER — Encounter: Payer: Medicaid Other | Admitting: Obstetrics and Gynecology

## 2020-06-05 ENCOUNTER — Ambulatory Visit: Payer: Medicaid Other | Admitting: *Deleted

## 2020-06-05 ENCOUNTER — Encounter: Payer: Self-pay | Admitting: *Deleted

## 2020-06-05 ENCOUNTER — Ambulatory Visit: Payer: Medicaid Other | Attending: Obstetrics and Gynecology

## 2020-06-05 ENCOUNTER — Other Ambulatory Visit: Payer: Self-pay

## 2020-06-05 VITALS — BP 130/85 | HR 79

## 2020-06-05 DIAGNOSIS — O10919 Unspecified pre-existing hypertension complicating pregnancy, unspecified trimester: Secondary | ICD-10-CM | POA: Diagnosis present

## 2020-06-05 DIAGNOSIS — O09523 Supervision of elderly multigravida, third trimester: Secondary | ICD-10-CM | POA: Diagnosis not present

## 2020-06-05 DIAGNOSIS — E119 Type 2 diabetes mellitus without complications: Secondary | ICD-10-CM

## 2020-06-05 DIAGNOSIS — O24113 Pre-existing diabetes mellitus, type 2, in pregnancy, third trimester: Secondary | ICD-10-CM

## 2020-06-05 DIAGNOSIS — O34219 Maternal care for unspecified type scar from previous cesarean delivery: Secondary | ICD-10-CM

## 2020-06-05 DIAGNOSIS — O403XX0 Polyhydramnios, third trimester, not applicable or unspecified: Secondary | ICD-10-CM

## 2020-06-05 DIAGNOSIS — D696 Thrombocytopenia, unspecified: Secondary | ICD-10-CM

## 2020-06-05 DIAGNOSIS — O10913 Unspecified pre-existing hypertension complicating pregnancy, third trimester: Secondary | ICD-10-CM | POA: Diagnosis not present

## 2020-06-05 DIAGNOSIS — O99333 Smoking (tobacco) complicating pregnancy, third trimester: Secondary | ICD-10-CM

## 2020-06-05 DIAGNOSIS — O09293 Supervision of pregnancy with other poor reproductive or obstetric history, third trimester: Secondary | ICD-10-CM

## 2020-06-05 DIAGNOSIS — O365930 Maternal care for other known or suspected poor fetal growth, third trimester, not applicable or unspecified: Secondary | ICD-10-CM

## 2020-06-05 DIAGNOSIS — Z720 Tobacco use: Secondary | ICD-10-CM

## 2020-06-05 DIAGNOSIS — O99113 Other diseases of the blood and blood-forming organs and certain disorders involving the immune mechanism complicating pregnancy, third trimester: Secondary | ICD-10-CM

## 2020-06-06 ENCOUNTER — Other Ambulatory Visit: Payer: Self-pay | Admitting: *Deleted

## 2020-06-06 DIAGNOSIS — O10919 Unspecified pre-existing hypertension complicating pregnancy, unspecified trimester: Secondary | ICD-10-CM

## 2020-06-06 NOTE — Progress Notes (Unsigned)
Us/

## 2020-06-11 ENCOUNTER — Encounter: Payer: Medicaid Other | Admitting: Obstetrics and Gynecology

## 2020-06-12 ENCOUNTER — Other Ambulatory Visit: Payer: Self-pay

## 2020-06-12 ENCOUNTER — Encounter: Payer: Self-pay | Admitting: *Deleted

## 2020-06-12 ENCOUNTER — Ambulatory Visit: Payer: Medicaid Other | Admitting: *Deleted

## 2020-06-12 ENCOUNTER — Ambulatory Visit: Payer: Medicaid Other | Attending: Obstetrics and Gynecology

## 2020-06-12 VITALS — BP 130/87 | HR 80

## 2020-06-12 DIAGNOSIS — O403XX0 Polyhydramnios, third trimester, not applicable or unspecified: Secondary | ICD-10-CM

## 2020-06-12 DIAGNOSIS — Z3A34 34 weeks gestation of pregnancy: Secondary | ICD-10-CM

## 2020-06-12 DIAGNOSIS — O99113 Other diseases of the blood and blood-forming organs and certain disorders involving the immune mechanism complicating pregnancy, third trimester: Secondary | ICD-10-CM

## 2020-06-12 DIAGNOSIS — O09523 Supervision of elderly multigravida, third trimester: Secondary | ICD-10-CM

## 2020-06-12 DIAGNOSIS — O10913 Unspecified pre-existing hypertension complicating pregnancy, third trimester: Secondary | ICD-10-CM | POA: Diagnosis not present

## 2020-06-12 DIAGNOSIS — O09293 Supervision of pregnancy with other poor reproductive or obstetric history, third trimester: Secondary | ICD-10-CM

## 2020-06-12 DIAGNOSIS — O24113 Pre-existing diabetes mellitus, type 2, in pregnancy, third trimester: Secondary | ICD-10-CM

## 2020-06-12 DIAGNOSIS — O34219 Maternal care for unspecified type scar from previous cesarean delivery: Secondary | ICD-10-CM

## 2020-06-12 DIAGNOSIS — O99333 Smoking (tobacco) complicating pregnancy, third trimester: Secondary | ICD-10-CM

## 2020-06-12 DIAGNOSIS — Z720 Tobacco use: Secondary | ICD-10-CM

## 2020-06-12 DIAGNOSIS — O10919 Unspecified pre-existing hypertension complicating pregnancy, unspecified trimester: Secondary | ICD-10-CM

## 2020-06-12 DIAGNOSIS — O365930 Maternal care for other known or suspected poor fetal growth, third trimester, not applicable or unspecified: Secondary | ICD-10-CM

## 2020-06-12 DIAGNOSIS — D696 Thrombocytopenia, unspecified: Secondary | ICD-10-CM

## 2020-06-17 ENCOUNTER — Other Ambulatory Visit: Payer: Self-pay

## 2020-06-17 ENCOUNTER — Ambulatory Visit (INDEPENDENT_AMBULATORY_CARE_PROVIDER_SITE_OTHER): Payer: Medicaid Other | Admitting: Obstetrics and Gynecology

## 2020-06-17 ENCOUNTER — Encounter: Payer: Self-pay | Admitting: Obstetrics and Gynecology

## 2020-06-17 VITALS — BP 139/87 | HR 78 | Wt 164.0 lb

## 2020-06-17 DIAGNOSIS — O10919 Unspecified pre-existing hypertension complicating pregnancy, unspecified trimester: Secondary | ICD-10-CM

## 2020-06-17 DIAGNOSIS — D696 Thrombocytopenia, unspecified: Secondary | ICD-10-CM

## 2020-06-17 DIAGNOSIS — O24112 Pre-existing diabetes mellitus, type 2, in pregnancy, second trimester: Secondary | ICD-10-CM

## 2020-06-17 DIAGNOSIS — Z98891 History of uterine scar from previous surgery: Secondary | ICD-10-CM

## 2020-06-17 DIAGNOSIS — O0990 Supervision of high risk pregnancy, unspecified, unspecified trimester: Secondary | ICD-10-CM

## 2020-06-17 DIAGNOSIS — Z8759 Personal history of other complications of pregnancy, childbirth and the puerperium: Secondary | ICD-10-CM

## 2020-06-17 DIAGNOSIS — O99119 Other diseases of the blood and blood-forming organs and certain disorders involving the immune mechanism complicating pregnancy, unspecified trimester: Secondary | ICD-10-CM

## 2020-06-17 DIAGNOSIS — O09523 Supervision of elderly multigravida, third trimester: Secondary | ICD-10-CM

## 2020-06-17 NOTE — Progress Notes (Signed)
   PRENATAL VISIT NOTE  Subjective:  Terri Bradford is a 39 y.o. [id number] at [redacted]w[redacted]d being seen today for ongoing prenatal care.  She is currently monitored for the following issues for this high-risk pregnancy and has Type 2 diabetes mellitus in pregnancy, second trimester; Supervision of high risk pregnancy, antepartum; History of intrauterine fetal death in previous pregnancy; Chronic hypertension in pregnancy; History of cesarean section; AMA (advanced maternal age) multigravida 35+; Thrombocytopenia affecting pregnancy (HCC); and [redacted] weeks gestation of pregnancy on their problem list.  Patient reports no complaints.  Contractions: Not present. Vag. Bleeding: None.  Movement: Present. Denies leaking of fluid.   Patient on metformin.  Glucose log reviewed and noted to be within normal limits.    The following portions of the patient's history were reviewed and updated as appropriate: allergies, current medications, past family history, past medical history, past social history, past surgical history and problem list.   Objective:   Vitals:   06/17/20 1437  BP: 139/87  Pulse: 78  Weight: 164 lb (74.4 kg)    Fetal Status: Fetal Heart Rate (bpm): 137 Fundal Height: 37 cm Movement: Present  Presentation: Vertex  General:  Alert, oriented and cooperative. Patient is in no acute distress.  Skin: Skin is warm and dry. No rash noted.   Cardiovascular: Normal heart rate noted  Respiratory: Normal respiratory effort, no problems with respiration noted  Abdomen: Soft, gravid, appropriate for gestational age.  Pain/Pressure: Absent     Pelvic: Cervical exam deferred        Extremities: Normal range of motion.  Edema: Trace  Mental Status: Normal mood and affect. Normal behavior. Normal judgment and thought content.   Assessment and Plan:  Pregnancy: [id number] at [redacted]w[redacted]d 1. Supervision of high risk pregnancy, antepartum - Patient for GBS testing next week.    2. Chronic hypertension in pregnancy -  Stable.   - Continue labetalol  3. Type 2 diabetes mellitus in pregnancy, second trimester - Continue Metformin. - Glucose readings within acceptable range. - Borderline polyhydramnios. - Per MFM, delivery at 37 weeks.    4. Thrombocytopenia affecting pregnancy (HCC)  - CBC  5. History of intrauterine fetal death in previous pregnancy - Continue weekly BPPs. - Patient for delivery at 37 weeks per MFM.    6. History of cesarean section - For repeat c/section at 37 weeks.    7. Multigravida of advanced maternal age in third trimester - Kick counts discussed.    Preterm labor symptoms and general obstetric precautions including but not limited to vaginal bleeding, contractions, leaking of fluid and fetal movement were reviewed in detail with the patient. Please refer to After Visit Summary for other counseling recommendations.   Return in about 1 week (around 06/24/2020) for HROB.  Future Appointments  Date Time Provider Department Center  06/17/2020  4:15 PM Johnny Bridge, MD [facility] None  06/18/2020 11:00 AM Johney Maine, MD [facility] None  06/19/2020 12:45 PM [facility]URSE [facility]  06/19/2020  1:00 PM [facility]1 [facility]  06/24/2020 11:00 AM [facility]  06/24/2020 11:15 AM [facility]2 [facility]    Johnny Bridge, MD

## 2020-06-18 ENCOUNTER — Telehealth: Payer: Self-pay | Admitting: Hematology and Oncology

## 2020-06-18 ENCOUNTER — Encounter: Payer: Medicaid Other | Admitting: Hematology

## 2020-06-18 LAB — CBC
Hematocrit: 30.5 % — ABNORMAL LOW (ref 34.0–46.6)
Hemoglobin: 10.5 g/dL — ABNORMAL LOW (ref 11.1–15.9)
MCH: 33.9 pg — ABNORMAL HIGH (ref 26.6–33.0)
MCHC: 34.4 g/dL (ref 31.5–35.7)
MCV: 98 fL — ABNORMAL HIGH (ref 79–97)
Platelets: 75 10*3/uL — CL (ref 150–450)
RBC: 3.1 x10E6/uL — ABNORMAL LOW (ref 3.77–5.28)
RDW: 13.6 % (ref 11.7–15.4)
WBC: 6.6 10*3/uL (ref 3.4–10.8)

## 2020-06-18 NOTE — Telephone Encounter (Signed)
Received a call from Ms. Duling to reschedule her new hem appt to 11/18 at 345pm to see Dr. Pamelia Hoit

## 2020-06-19 ENCOUNTER — Encounter: Payer: Self-pay | Admitting: *Deleted

## 2020-06-19 ENCOUNTER — Inpatient Hospital Stay: Payer: Medicaid Other | Admitting: Hematology and Oncology

## 2020-06-19 ENCOUNTER — Other Ambulatory Visit: Payer: Self-pay

## 2020-06-19 ENCOUNTER — Telehealth: Payer: Self-pay | Admitting: Hematology

## 2020-06-19 ENCOUNTER — Ambulatory Visit: Payer: Medicaid Other | Admitting: *Deleted

## 2020-06-19 ENCOUNTER — Ambulatory Visit: Payer: Medicaid Other | Attending: Obstetrics and Gynecology

## 2020-06-19 VITALS — BP 130/87 | HR 69

## 2020-06-19 DIAGNOSIS — Z720 Tobacco use: Secondary | ICD-10-CM

## 2020-06-19 DIAGNOSIS — O34219 Maternal care for unspecified type scar from previous cesarean delivery: Secondary | ICD-10-CM

## 2020-06-19 DIAGNOSIS — O10913 Unspecified pre-existing hypertension complicating pregnancy, third trimester: Secondary | ICD-10-CM | POA: Diagnosis not present

## 2020-06-19 DIAGNOSIS — O24113 Pre-existing diabetes mellitus, type 2, in pregnancy, third trimester: Secondary | ICD-10-CM

## 2020-06-19 DIAGNOSIS — O10919 Unspecified pre-existing hypertension complicating pregnancy, unspecified trimester: Secondary | ICD-10-CM | POA: Insufficient documentation

## 2020-06-19 DIAGNOSIS — O09523 Supervision of elderly multigravida, third trimester: Secondary | ICD-10-CM | POA: Insufficient documentation

## 2020-06-19 DIAGNOSIS — O403XX0 Polyhydramnios, third trimester, not applicable or unspecified: Secondary | ICD-10-CM | POA: Diagnosis not present

## 2020-06-19 DIAGNOSIS — D696 Thrombocytopenia, unspecified: Secondary | ICD-10-CM

## 2020-06-19 DIAGNOSIS — Z3A35 35 weeks gestation of pregnancy: Secondary | ICD-10-CM

## 2020-06-19 DIAGNOSIS — O365930 Maternal care for other known or suspected poor fetal growth, third trimester, not applicable or unspecified: Secondary | ICD-10-CM

## 2020-06-19 DIAGNOSIS — O99333 Smoking (tobacco) complicating pregnancy, third trimester: Secondary | ICD-10-CM

## 2020-06-19 DIAGNOSIS — Z8759 Personal history of other complications of pregnancy, childbirth and the puerperium: Secondary | ICD-10-CM

## 2020-06-19 DIAGNOSIS — O99113 Other diseases of the blood and blood-forming organs and certain disorders involving the immune mechanism complicating pregnancy, third trimester: Secondary | ICD-10-CM

## 2020-06-19 NOTE — Telephone Encounter (Signed)
Pt cld to reschedule her new hem appt to 11/23 at 3pm to see Dr. Candise Che.

## 2020-06-19 NOTE — Assessment & Plan Note (Deleted)
Lab review 03/26/20 showed platelets 96,  05/08/20 showed platelets 81,  06/17/20 showed platelets 75  Results for Player, MUREL WIGLE (MRN [id number]) as of 06/19/2020 06:49  Ref. Range 05/09/2015 22:59 02/09/2017 21:10 03/23/2017 19:29 08/06/2019 01:00 08/24/2019 18:50 01/03/2020 14:40 01/31/2020 14:08 02/28/2020 14:11 03/26/2020 11:58 05/08/2020 15:55 06/17/2020 15:02  Platelets Latest Ref Range: 150 - 450 x10E3/uL 122 (L) 122 (L) 132 (L) 145 (L) 154 100 (LL) 91 (LL) 95 (LL) 96 (LL) 81 (LL) 75 (LL)   Differential diagnosis: 1. Low-grade ITP 2. medication induced: On further review patient is not taking any medications that are associated with thrombocytopenia 3. Bone marrow factors 4. Hepatitis B/C 5. Splenomegaly: No enlarged spleen was palpable to physical exam. 6.  Liver disorders 7.  Toxins like alcohol  I discussed with the patient that the level of thrombocytopenia is very mild and that these levels, there are usually no adverse effects.

## 2020-06-24 ENCOUNTER — Inpatient Hospital Stay: Payer: Medicaid Other | Attending: Hematology | Admitting: Hematology

## 2020-06-24 ENCOUNTER — Ambulatory Visit: Payer: Medicaid Other | Admitting: *Deleted

## 2020-06-24 ENCOUNTER — Other Ambulatory Visit: Payer: Self-pay | Admitting: Obstetrics

## 2020-06-24 ENCOUNTER — Ambulatory Visit: Payer: Medicaid Other | Attending: Obstetrics and Gynecology

## 2020-06-24 ENCOUNTER — Other Ambulatory Visit: Payer: Self-pay

## 2020-06-24 ENCOUNTER — Encounter: Payer: Self-pay | Admitting: *Deleted

## 2020-06-24 VITALS — BP 141/87 | HR 80

## 2020-06-24 VITALS — BP 146/85 | HR 79 | Temp 97.1°F | Resp 20 | Ht 63.0 in | Wt 164.6 lb

## 2020-06-24 DIAGNOSIS — O10919 Unspecified pre-existing hypertension complicating pregnancy, unspecified trimester: Secondary | ICD-10-CM | POA: Insufficient documentation

## 2020-06-24 DIAGNOSIS — D696 Thrombocytopenia, unspecified: Secondary | ICD-10-CM | POA: Diagnosis present

## 2020-06-24 DIAGNOSIS — Z7984 Long term (current) use of oral hypoglycemic drugs: Secondary | ICD-10-CM | POA: Diagnosis not present

## 2020-06-24 DIAGNOSIS — Z3A36 36 weeks gestation of pregnancy: Secondary | ICD-10-CM

## 2020-06-24 DIAGNOSIS — O99333 Smoking (tobacco) complicating pregnancy, third trimester: Secondary | ICD-10-CM

## 2020-06-24 DIAGNOSIS — O24113 Pre-existing diabetes mellitus, type 2, in pregnancy, third trimester: Secondary | ICD-10-CM

## 2020-06-24 DIAGNOSIS — O365930 Maternal care for other known or suspected poor fetal growth, third trimester, not applicable or unspecified: Secondary | ICD-10-CM

## 2020-06-24 DIAGNOSIS — O10913 Unspecified pre-existing hypertension complicating pregnancy, third trimester: Secondary | ICD-10-CM

## 2020-06-24 DIAGNOSIS — Z720 Tobacco use: Secondary | ICD-10-CM

## 2020-06-24 DIAGNOSIS — O09523 Supervision of elderly multigravida, third trimester: Secondary | ICD-10-CM | POA: Diagnosis not present

## 2020-06-24 DIAGNOSIS — D649 Anemia, unspecified: Secondary | ICD-10-CM

## 2020-06-24 DIAGNOSIS — O99013 Anemia complicating pregnancy, third trimester: Secondary | ICD-10-CM

## 2020-06-24 DIAGNOSIS — O99113 Other diseases of the blood and blood-forming organs and certain disorders involving the immune mechanism complicating pregnancy, third trimester: Secondary | ICD-10-CM | POA: Insufficient documentation

## 2020-06-24 DIAGNOSIS — O403XX0 Polyhydramnios, third trimester, not applicable or unspecified: Secondary | ICD-10-CM

## 2020-06-24 DIAGNOSIS — F17210 Nicotine dependence, cigarettes, uncomplicated: Secondary | ICD-10-CM | POA: Diagnosis not present

## 2020-06-24 DIAGNOSIS — M2548 Effusion, other site: Secondary | ICD-10-CM

## 2020-06-24 DIAGNOSIS — E119 Type 2 diabetes mellitus without complications: Secondary | ICD-10-CM | POA: Diagnosis not present

## 2020-06-24 DIAGNOSIS — O1203 Gestational edema, third trimester: Secondary | ICD-10-CM

## 2020-06-24 DIAGNOSIS — O99119 Other diseases of the blood and blood-forming organs and certain disorders involving the immune mechanism complicating pregnancy, unspecified trimester: Secondary | ICD-10-CM

## 2020-06-24 DIAGNOSIS — O09293 Supervision of pregnancy with other poor reproductive or obstetric history, third trimester: Secondary | ICD-10-CM

## 2020-06-24 DIAGNOSIS — O34219 Maternal care for unspecified type scar from previous cesarean delivery: Secondary | ICD-10-CM

## 2020-06-24 NOTE — Progress Notes (Signed)
Last dose Labetalol 06/23/20 at 1pm

## 2020-06-24 NOTE — Progress Notes (Signed)
HEMATOLOGY/ONCOLOGY CONSULTATION NOTE  Date of Service: 06/24/2020  Patient Care Team: Care, Jinny Blossom [facility] as PCP - General (Family Medicine)  CHIEF COMPLAINTS/PURPOSE OF CONSULTATION:  Thrombocytopenia in pregnancy  HISTORY OF PRESENTING ILLNESS:   Terri Bradford is a wonderful 39 y.o. female who has been referred to [place] by Dr. Mora Bellman for evaluation and management of thrombocytopenia in pregnancy. The pt reports that she is doing well overall.   The pt reports that she is 36 weeks and 3 days pregnant. This is her fourth pregnancy. Pt is scheduled to go into the hospital on Monday to deliver but does not have a planned C-section at this time. Pt has two children and has had one stillbirth. She has had one C-section and two normal vaginal deliveries. She has never previously been told that she had low PLT during pregnancy or otherwise. She denies any excessive or abnormal bleeding or bruising. Pt is currently taking prenatal vitamins, Aspirin, Metformin, and Labetalol. Pt has had proteinuria during pregnancy. She is eating well and gaining weight appropriately. Pt has been experiencing intermittent left leg swelling for 2-3 weeks. Pt denies left calf or thigh pain.  Most recent lab results (06/17/2020) of CBC is as follows: all values are WNL except for RBC at 3.10, Hgb at 10.5, HCT at 30.5, MCV at 98, MCH at 33.9, PLT at 75K.  On review of systems, pt reports leg swelling and denies abdominal pain, calf pain, thigh pain, excessive bleeding/bruising and any other symptoms.   On PMHx the pt reports C-section, Diabetes, Pregnancy induced HTN. On Social Hx the pt reports that she is currently smoking 6-7 cigarettes per day and drinks a small amount of wine occasionally.   MEDICAL HISTORY:  Past Medical History:  Diagnosis Date  . Anxiety   . Blood transfusion without reported diagnosis   . Diabetes mellitus without complication ([place])   . Hypertension   . Pregnancy  induced hypertension   . Thrombocytopenia, unspecified ([place])    Thrombocytopenia    SURGICAL HISTORY: Past Surgical History:  Procedure Laterality Date  . CESAREAN SECTION      SOCIAL HISTORY: Social History   Socioeconomic History  . Marital status: Single    Spouse name: Not on file  . Number of children: 2  . Years of education: Not on file  . Highest education level: Not on file  Occupational History  . Occupation: unemployed  Tobacco Use  . Smoking status: Current Every Day Smoker    Packs/day: 0.00    Types: Cigarettes  . Smokeless tobacco: Never Used  Vaping Use  . Vaping Use: Never used  Substance and Sexual Activity  . Alcohol use: Not Currently  . Drug use: No  . Sexual activity: Yes    Birth control/protection: None  Other Topics Concern  . Not on file  Social History Narrative  . Not on file   Social Determinants of Health   Financial Resource Strain:   . Difficulty of Paying Living Expenses: Not on file  Food Insecurity:   . Worried About Charity fundraiser in the Last Year: Not on file  . Ran Out of Food in the Last Year: Not on file  Transportation Needs:   . Lack of Transportation (Medical): Not on file  . Lack of Transportation (Non-Medical): Not on file  Physical Activity:   . Days of Exercise per Week: Not on file  . Minutes of Exercise per Session: Not on file  Stress:   . Feeling of Stress : Not on file  Social Connections:   . Frequency of Communication with Friends and Family: Not on file  . Frequency of Social Gatherings with Friends and Family: Not on file  . Attends Religious Services: Not on file  . Active Member of Clubs or Organizations: Not on file  . Attends Archivist Meetings: Not on file  . Marital Status: Not on file  Intimate Partner Violence:   . Fear of Current or Ex-Partner: Not on file  . Emotionally Abused: Not on file  . Physically Abused: Not on file  . Sexually Abused: Not on file    FAMILY  HISTORY: Family History  Problem Relation Age of Onset  . Hypertension Mother   . COPD Mother   . Anxiety disorder Sister   . Diabetes Sister     ALLERGIES:  has No Known Allergies.  MEDICATIONS:  Current Outpatient Medications  Medication Sig Dispense Refill  . aspirin EC 81 MG tablet Take 1 tablet (81 mg total) by mouth daily. Take after 12 weeks for prevention of preeclampsia later in pregnancy 300 tablet 2  . Blood Pressure Monitoring (BLOOD PRESSURE KIT) DEVI 1 kit by Does not apply route once a week. Check Blood Pressure regularly and record readings into the [employer] App.  Large Cuff.  DX O90.0 1 each 0  . labetalol (NORMODYNE) 200 MG tablet Take 1 tablet (200 mg total) by mouth 2 (two) times daily. 60 tablet 3  . metFORMIN (GLUCOPHAGE) 500 MG tablet Take 1 tablet (500 mg total) by mouth 2 (two) times daily with a meal. 60 tablet 5  . potassium chloride (MICRO-K) 10 MEQ CR capsule Take 10 mEq by mouth daily. (Patient not taking: Reported on 01/31/2020)    . Prenatal Vit-Fe Fumarate-FA (PREPLUS) 27-1 MG TABS Take 1 tablet by mouth daily. 30 tablet 13   No current facility-administered medications for this visit.    REVIEW OF SYSTEMS:    10 Point review of Systems was done is negative except as noted above.  PHYSICAL EXAMINATION: ECOG PERFORMANCE STATUS: 0 - Asymptomatic  . Vitals:   06/24/20 1501  BP: (!) 146/85  Pulse: 79  Resp: 20  Temp: (!) 97.1 F (36.2 C)  SpO2: 100%   Filed Weights   06/24/20 1501  Weight: 164 lb 9.6 oz (74.7 kg)   .Body mass index is 29.16 kg/m.  GENERAL:alert, in no acute distress and comfortable SKIN: no acute rashes, no significant lesions EYES: conjunctiva are pink and non-injected, sclera anicteric OROPHARYNX: MMM, no exudates, no oropharyngeal erythema or ulceration NECK: supple, no JVD LYMPH:  no palpable lymphadenopathy in the cervical, axillary or inguinal regions LUNGS: clear to auscultation b/l with normal respiratory  effort HEART: regular rate & rhythm ABDOMEN:  normoactive bowel sounds , non tender, not distended. Extremity: no pedal edema PSYCH: alert & oriented x 3 with fluent speech NEURO: no focal motor/sensory deficits  LABORATORY DATA:  I have reviewed the data as listed  . CBC Latest Ref Rng & Units 06/17/2020 05/08/2020 03/26/2020  WBC 3.4 - 10.8 x10E3/uL 6.6 6.8 9.7  Hemoglobin 11.1 - 15.9 g/dL 10.5(L) 11.8 12.2  Hematocrit 34.0 - 46.6 % 30.5(L) 33.8(L) 36.1  Platelets 150 - 450 x10E3/uL 75(LL) 81(LL) 96(LL)    . CMP Latest Ref Rng & Units 05/08/2020 01/03/2020 08/24/2019  Glucose 65 - 99 mg/dL 76 145(H) 117(H)  BUN 6 - 24 mg/dL 4(L) 5(L) <5(L)  Creatinine 0.57 - 1.00  mg/dL 0.52(L) 0.72 0.87  Sodium 134 - 144 mmol/L 142 140 134(L)  Potassium 3.5 - 5.2 mmol/L 4.0 3.9 3.6  Chloride 96 - 106 mmol/L 110(H) 107(H) 98  CO2 20 - 29 mmol/L _0 Calcium 8.7 - 10.2 mg/dL 8.4(L) 9.2 9.5  Total Protein 6.0 - 8.5 g/dL 5.9(L) 6.3 -  Total Bilirubin 0.0 - 1.2 mg/dL 0.5 0.4 -  Alkaline Phos 44 - 121 IU/L 116 68 -  AST 0 - 40 IU/L 15 11 -  ALT 0 - 32 IU/L 9 6 -    Component     Latest Ref Rng & Units 03/26/2020 05/08/2020 06/17/2020  Platelets     150 - 450 x10E3/uL 96 (LL) 81 (LL) 75 (LL)   Component     Latest Ref Rng & Units 08/06/2019 08/24/2019 01/03/2020  Platelets     150 - 450 x10E3/uL 145 (L) 154 100 (LL)    RADIOGRAPHIC STUDIES: I have personally reviewed the radiological images as listed and agreed with the findings in the report. [place] MFM FETAL BPP WO NON STRESS  Result Date: 06/24/2020 ----------------------------------------------------------------------  OBSTETRICS REPORT                       (Signed Final 06/24/2020 12:19 pm) ---------------------------------------------------------------------- Patient Info  ID #:       [id number]                          D.O.B.:  [birth year] (39 yrs)  Name:       Terri Bradford                   Visit Date: 06/24/2020 10:41 am  ---------------------------------------------------------------------- Performed By  Attending:        Sander Nephew      [facility]  Performed By:     Corky Crafts             [facility]  Referred By:      Griffin Basil MD ---------------------------------------------------------------------- Orders  #  Description                           Code        Ordered By  1  [place] MFM FETAL BPP WO NON               76819.01    YU FANG     STRESS  2  [place] MFM OB FOLLOW UP                   76816.01    YU FANG  3  [place] MFM UA CORD DOPPLER                76820.02    YU FANG ----------------------------------------------------------------------  #  Order #                     Accession #                Episode #  1  [id number]                   [id number]                 [id number]  2  [id number]                   [id number]                 [id number]  3  [id number]                   [id number]                 [id number] ---------------------------------------------------------------------- Indications  Maternal care for known or suspected poor      O36.5930  fetal growth, third trimester, not applicable or  unspecified IUGR  [redacted] weeks gestation of pregnancy                Z3A.36  Polyhydramnios, third trimester, antepartum    O40.3XX0  condition or complication, unspecified fetus  (prev seen)  Advanced maternal age multigravida 61+,        O77.523  third trimester  Pre-existing diabetes, type 2, in pregnancy,   O24.113  third trimester (metformin)  Tobacco use complicating pregnancy, third      O99.333   trimester  Hypertension - Chronic/Pre-existing            O10.019  (Labetalol)  Poor obstetric history: Previous IUFD          O09.299  (stillbirth)  Thrombocytopenia affecting pregnancy,          O99.119, D69.6  antepartum  History of cesarean delivery, currently        O34.219  pregnant  Low Risk NIPS(Negative Horizon) ---------------------------------------------------------------------- Vital Signs  Height:        5'3" ---------------------------------------------------------------------- Fetal Evaluation  Num Of Fetuses:         1  Fetal Heart Rate(bpm):  136  Cardiac Activity:       Observed  Presentation:           Cephalic  Placenta:               Anterior  P. Cord Insertion:      Previously Visualized  Amniotic Fluid  AFI FV:      Within normal limits  AFI Sum(cm)     %Tile       Largest Pocket(cm)  17.11           64          5.82  RUQ(cm)       RLQ(cm)       LUQ(cm)        LLQ(cm)  3.61          4.52          5.82           3.16 ---------------------------------------------------------------------- Biophysical Evaluation  Amniotic F.V:   Within normal limits       F. Tone:        Observed  F. Movement:    Observed                   Score:          8/8  F. Breathing:   Observed ---------------------------------------------------------------------- Biometry  BPD:      82.4  mm     G. Age:  33w 1d        1.3  %    CI:        71.58   %    70 - 86                                                          FL/HC:      20.0   %    20.1 - 22.1  HC:      310.1  mm     G. Age:  34w 5d        2.1  %    HC/AC:      0.99        0.93 - 1.11  AC:      313.4  mm     G. Age:  35w 2d         28  %    FL/BPD:     75.2   %    71 - 87  FL:         62  mm     G. Age:  32w 1d        < 1  %    FL/AC:      19.8   %    20 - 24  HUM:      52.2  mm     G. Age:  30w 3d        < 5  %  LV:        6.8  mm  Est. FW:    2361  gm      5 lb 3 oz  7  %  ---------------------------------------------------------------------- OB History  Gravidity:    4         Term:   3  Living:       2 ---------------------------------------------------------------------- Gestational Age  LMP:           36w 3d        Date:  10/13/19                 EDD:   07/19/20  U/S Today:     33w 6d                                        EDD:   08/06/20  Best:          36w 3d     Det. By:  LMP  (10/13/19)          EDD:   07/19/20 ---------------------------------------------------------------------- Anatomy  Cranium:               Appears normal         Aortic Arch:            Previously seen  Cavum:                 Previously seen        Ductal Arch:            Previously seen  Ventricles:            Appears normal         Diaphragm:              Previously seen  Choroid Plexus:        Previously seen        Stomach:                Appears normal, left                                                                        sided  Cerebellum:            Previously seen        Abdomen:                Appears normal  Posterior Fossa:       Previously seen        Abdominal Wall:         Previously seen  Nuchal Fold:           Not applicable (>31    Cord Vessels:           Previously seen                         wks GA)  Face:                  Orbits and profile     Kidneys:                Appear normal  previously seen  Lips:                  Previously seen        Bladder:                Appears normal  Thoracic:              Appears normal         Spine:                  Previously seen  Heart:                 Appears normal         Upper Extremities:      Previously seen                         (4CH, axis, and                         situs)  RVOT:                  Previously seen        Lower Extremities:      Rt Clubfoot prev                                                                        seen  LVOT:                  Previously seen  Other:  Heels and 5th digit prev  visualized. ([place]) 3VV prev          visualized. Nasal bone prev visualized. Technically difficult due to          fetal position. ---------------------------------------------------------------------- Doppler - Fetal Vessels  Umbilical Artery   S/D     %tile      RI    %tile                             ADFV    RDFV   2.59       63    0.61       68                                No      No ---------------------------------------------------------------------- Cervix Uterus Adnexa  Cervix  Not visualized (advanced GA >24wks) ---------------------------------------------------------------------- Impression  Ms. Darnold is a 39 yo with prior FGR, Chronic hypertension  and known pregestational diabetes. .  Patient takes labetalol  200 mg twice daily. She did not take her labetalol this  morning and her blood pressures today at our office was  150/88, 141/87  She takes Metformin 500 mg once daily. She reports good  blood glucose levels. Her measurements in the prior week  are >90% normal.  Normal interval growth with measurements less than dates  again with EFW 7th%  Biophysical profile 8/8 today good fetal movement and  amniotic fluid volume  UA Dopplers are normal without evidence of AEDF or REDF.  Polyhydramnios resolved.  Lastly, she has thrombocytopenia with platelets las 75k She  is seeing a hematologist today to discuss diagnosis and  option for glucocorticoids to increase platelet count.  She sees Dr. Rip Harbour tomorrow for prenatal care and delivery  planning, she is a candidate for TOLAC.  Ms. Durrett is scheduled for delivery at 54 weeks. ---------------------------------------------------------------------- Recommendations  Delivery scheduled at 06/30/20 ----------------------------------------------------------------------               Sander Nephew, MD Electronically Signed Final Report   06/24/2020 12:19 pm ----------------------------------------------------------------------  [place] MFM FETAL BPP WO  NON STRESS  Result Date: 06/20/2020 ----------------------------------------------------------------------  OBSTETRICS REPORT                       (Signed Final 06/20/2020 09:52 am) ---------------------------------------------------------------------- Patient Info  ID #:       [id number]                          D.O.B.:  [birth year] (39 yrs)  Name:       Terri Bradford                   Visit Date: 06/19/2020 12:56 pm ---------------------------------------------------------------------- Performed By  Attending:        Tama High MD        Ref. Address:     [facility]                                                             [place]  Performed By:     Wilnette Kales        Location[facility]                                 [facility] at                                                             [place] [facility]                                                             [facility]  Referred By:      Carolann Littler  BASS MD ---------------------------------------------------------------------- Orders  #  Description                           Code        Ordered By  1  [place] MFM FETAL BPP WO NON               33825.05    YU FANG     STRESS ----------------------------------------------------------------------  #  Order #                     Accession #                Episode #  1  [id number]                   [id number]                 [id number] ---------------------------------------------------------------------- Indications  Polyhydramnios, third trimester, antepartum    O40.3XX0  condition or complication, unspecified fetus  Advanced maternal age multigravida 52+,        O84.523  third trimester  Pre-existing diabetes, type 2, in pregnancy,   O24.113  third trimester (metformin)  Tobacco use complicating pregnancy, third      O99.333  trimester  Hypertension - Chronic/Pre-existing             O10.019  (Labetalol)  Poor obstetric history: Previous IUFD          O09.299  (stillbirth)  Thrombocytopenia affecting pregnancy,          O99.119, D69.6  antepartum  History of cesarean delivery, currently        O39.219  pregnant  Maternal care for known or suspected poor      O36.5930  fetal growth, third trimester, not applicable or  unspecified IUGR  Low Risk NIPS(Negative Horizon)  [redacted] weeks gestation of pregnancy                Z3A.35 ---------------------------------------------------------------------- Vital Signs                                                 Height:        5'3" ---------------------------------------------------------------------- Fetal Evaluation  Num Of Fetuses:         1  Fetal Heart Rate(bpm):  145  Cardiac Activity:       Observed  Presentation:           Cephalic  Placenta:               Anterior  P. Cord Insertion:      Previously seen as normal  Amniotic Fluid  AFI FV:      Subjectively upper-normal  AFI Sum(cm)     %Tile       Largest Pocket(cm)  21.96           83          6.91  RUQ(cm)       RLQ(cm)       LUQ(cm)        LLQ(cm)  6.91          3.56          4.75           6.74 ---------------------------------------------------------------------- Biophysical Evaluation  Amniotic F.V:   Within  normal limits       F. Tone:        Observed  F. Movement:    Observed                   Score:          8/8  F. Breathing:   Observed ---------------------------------------------------------------------- OB History  Gravidity:    4         Term:   3  Living:       2 ---------------------------------------------------------------------- Gestational Age  LMP:           35w 5d        Date:  10/13/19                 EDD:   07/19/20  Best:          35w 5d     Det. By:  LMP  (10/13/19)          EDD:   07/19/20 ---------------------------------------------------------------------- Anatomy  Cranium:               Appears normal         Stomach:                Appears normal, left                                                                         sided  Cavum:                 Appears normal         Kidneys:                Appear normal  Ventricles:            Appears normal         Bladder:                Appears normal ---------------------------------------------------------------------- Impression  Preexisting Diabetes.  Chronic hypertension.  History of fetal death.  Mild polyhydramnios.  Amniotic fluid is normal and good fetal activity is seen  .Antenatal testing is reassuring. BPP 8/8. Cephalic  presentation.  Because of her high-risk issues, I recommend delivery at 37  weeks. ---------------------------------------------------------------------- Recommendations  No follow-up appointments were made. ----------------------------------------------------------------------                  Tama High, MD Electronically Signed Final Report   06/20/2020 09:52 am ----------------------------------------------------------------------  [place] MFM FETAL BPP WO NON STRESS  Result Date: 06/12/2020 ----------------------------------------------------------------------  OBSTETRICS REPORT                       (Signed Final 06/12/2020 03:27 pm) ---------------------------------------------------------------------- Patient Info  ID #:       [id number]                          D.O.B.:  June 24, 1981 (39 yrs)  Name:       Terri Bradford                   Visit Date: 06/12/2020 01:59 pm ---------------------------------------------------------------------- Performed By  Attending:  Johnell Comings MD         Ref. Address:     [facility]                                                             [place]  Performed By:     Germain Osgood            Location[facility]         [facility]                                     [facility] at                                                             [place] [facility]                                                              [facility]  Referred By:      Griffin Basil MD ---------------------------------------------------------------------- Orders  #  Description                           Code        Ordered By  1  [place] MFM FETAL BPP WO NON               16384.53    YU FANG     STRESS ----------------------------------------------------------------------  #  Order #                     Accession #                Episode #  1  [id number]                   [id number]                 [id number] ---------------------------------------------------------------------- Indications  Polyhydramnios, third trimester, antepartum    O40.3XX0  condition or complication, unspecified fetus  Advanced maternal age multigravida 91+,        O2.523  third trimester  Pre-existing diabetes, type 2, in pregnancy,   O24.113  third trimester (metformin)  Tobacco use complicating  pregnancy, third      O99.333  trimester  Hypertension - Chronic/Pre-existing            O10.019  (Labetalol)  Poor obstetric history: Previous IUFD          O09.299  (stillbirth)  Thrombocytopenia affecting pregnancy,          O99.119, D69.6  antepartum  History of cesarean delivery, currently        O34.219  pregnant  Maternal care for known or suspected poor      O36.5930  fetal growth, third trimester, not applicable or  unspecified IUGR  Low Risk NIPS(Negative Horizon)  [redacted] weeks gestation of pregnancy                Z3A.34 ---------------------------------------------------------------------- Vital Signs                                                 Height:        5'3" ---------------------------------------------------------------------- Fetal Evaluation  Num Of Fetuses:         1  Fetal Heart Rate(bpm):  155  Cardiac Activity:       Observed  Presentation:           Cephalic  Placenta:               Posterior  P. Cord Insertion:      Visualized, central  Amniotic Fluid  AFI FV:      Subjectively increased  AFI Sum(cm)     %Tile        Largest Pocket(cm)  24.54           94          8.41  RUQ(cm)       RLQ(cm)       LUQ(cm)        LLQ(cm)  5.72          8.41          5.28           5.13 ---------------------------------------------------------------------- Biophysical Evaluation  Amniotic F.V:   Pocket => 2 cm             F. Tone:        Observed  F. Movement:    Observed                   Score:          8/8  F. Breathing:   Observed ---------------------------------------------------------------------- OB History  Gravidity:    4         Term:   3  Living:       2 ---------------------------------------------------------------------- Gestational Age  LMP:           34w 5d        Date:  10/13/19                 EDD:   07/19/20  Best:          34w 5d     Det. By:  LMP  (10/13/19)          EDD:   07/19/20 ---------------------------------------------------------------------- Anatomy  Diaphragm:             Appears normal         Kidneys:  Appear normal  Stomach:               Appears normal, left   Bladder:                Appears normal                         sided ---------------------------------------------------------------------- Cervix Uterus Adnexa  Cervix  Not visualized (advanced GA >24wks) ---------------------------------------------------------------------- Comments  This patient was seen for advanced maternal age greater  than 57, pre-existing diabetes currently treated with  Metformin, history of IUFD, and chronic hypertension  currently treated with labetalol.  She denies any problems  since her last exam.  A biophysical profile performed today was 8 out of 8.  Borderline polyhydramnios continues to be noted today.  Due  to her underlying medical conditions, we will continue to  follow her with weekly fetal testing until delivery.  She reports that she will most likely be delivered at around 37  weeks.  She was advised that I agree with this  recommendation.  ----------------------------------------------------------------------                   Johnell Comings, MD Electronically Signed Final Report   06/12/2020 03:27 pm ----------------------------------------------------------------------  [place] MFM FETAL BPP WO NON STRESS  Result Date: 06/05/2020 ----------------------------------------------------------------------  OBSTETRICS REPORT                       (Signed Final 06/05/2020 05:12 pm) ---------------------------------------------------------------------- Patient Info  ID #:       [id number]                          D.O.B.:  1981/06/14 (39 yrs)  Name:       Terri Bradford                   Visit Date: 06/05/2020 02:48 pm ---------------------------------------------------------------------- Performed By  Attending:        Tama High MD        Ref. Address[facility]     [facility]                                                             [place]  Performed By:     Jeanene Erb BS,      [facility]                                     [facility]  Referred By:      Griffin Basil MD ---------------------------------------------------------------------- Orders  #  Description                           Code        Ordered By  1  [place] MFM FETAL BPP WO NON               76819.01    RAVI SHANKAR     STRESS  2  [place] MFM OB FOLLOW UP                   57262.03    RAVI [facility] ----------------------------------------------------------------------  #  Order #                     Accession #                Episode #  1  [id number]                   [id number]                 [id number]  2  [id number]                   [id number]                 [id number]  ---------------------------------------------------------------------- Indications  [redacted] weeks gestation of pregnancy                Z3A.33  Polyhydramnios, third trimester, antepartum    O40.3XX0  condition or complication, unspecified fetus  Advanced maternal age multigravida 43+,        O70.523  third trimester  Pre-existing diabetes, type 2, in pregnancy,   O24.113  third trimester (metformin)  Tobacco use complicating pregnancy, third      O99.333  trimester  Hypertension - Chronic/Pre-existing            O10.019  (Labetalol)  Poor obstetric history: Previous IUFD          O09.299  (stillbirth)  Thrombocytopenia affecting pregnancy,          O99.119, D69.6  antepartum  History of cesarean delivery, currently        O44.219  pregnant  Maternal care for known or suspected poor      O36.5930  fetal growth, third trimester, not applicable or  unspecified IUGR  Low Risk NIPS(Negative Horizon) ---------------------------------------------------------------------- Vital Signs                                                 Height:        5'3" ---------------------------------------------------------------------- Fetal Evaluation  Num Of Fetuses:         1  Fetal Heart Rate(bpm):  141  Cardiac Activity:  Observed  Presentation:           Cephalic  Placenta:               Anterior  P. Cord Insertion:      Previously Visualized  Amniotic Fluid  AFI FV:      Polyhydramnios  AFI Sum(cm)     %Tile       Largest Pocket(cm)  25.04           95          10.72  RUQ(cm)       RLQ(cm)       LUQ(cm)        LLQ(cm)  5.49          4.53          10.72          4.3 ---------------------------------------------------------------------- Biophysical Evaluation  Amniotic F.V:   Pocket => 2 cm             F. Tone:        Observed  F. Movement:    Observed                   Score:          8/8  F. Breathing:   Observed ---------------------------------------------------------------------- Biometry  BPD:      80.8  mm     G. Age:  32w 3d          14  %    CI:        71.25   %    70 - 86                                                          FL/HC:      19.4   %    19.4 - 21.8  HC:      304.9  mm     G. Age:  33w 6d         20  %    HC/AC:      1.03        0.96 - 1.11  AC:      295.9  mm     G. Age:  33w 4d         49  %    FL/BPD:     73.1   %    71 - 87  FL:       59.1  mm     G. Age:  30w 6d        < 1  %    FL/AC:      20.0   %    20 - 24  Est. FW:    2035  gm      4 lb 8 oz     17  % ---------------------------------------------------------------------- OB History  Gravidity:    4         Term:   3  Living:       2 ---------------------------------------------------------------------- Gestational Age  LMP:           33w 5d        Date:  10/13/19                 EDD:  07/19/20  U/S Today:     32w 5d                                        EDD:   07/26/20  Best:          33w 5d     Det. By:  LMP  (10/13/19)          EDD:   07/19/20 ---------------------------------------------------------------------- Anatomy  Cranium:               Appears normal         Aortic Arch:            Previously seen  Cavum:                 Previously seen        Ductal Arch:            Previously seen  Ventricles:            Appears normal         Diaphragm:              Appears normal  Choroid Plexus:        Previously seen        Stomach:                Appears normal, left                                                                        sided  Cerebellum:            Previously seen        Abdomen:                Appears normal  Posterior Fossa:       Previously seen        Abdominal Wall:         Previously seen  Nuchal Fold:           Not applicable (>98    Cord Vessels:           Previously seen                         wks GA)  Face:                  Orbits and profile     Kidneys:                Appear normal                         previously seen  Lips:                  Previously seen        Bladder:                Appears normal  Thoracic:               Appears normal         Spine:  Previously seen  Heart:                 Previously seen        Upper Extremities:      Previously seen  RVOT:                  Previously seen        Lower Extremities:      Rt Clubfoot prev                                                                        seen  LVOT:                  Previously seen  Other:  Fetus appears to be a female. Heels and 5th digit visualized. ([place]) 3VV visualized. Nasal bone visualized. Technically difficult due          to fetal position. ---------------------------------------------------------------------- Cervix Uterus Adnexa  Cervix  Not visualized (advanced GA >24wks) ---------------------------------------------------------------------- Impression  Fetal growth is appropriate for gestational age . Mild  polyhydramnios is seen. Antenatal testing is reassuring. BPP  8/8.  This study was remotely read . ---------------------------------------------------------------------- Recommendations  -Continue weekly BPP till delivery. ----------------------------------------------------------------------                  Tama High, MD Electronically Signed Final Report   06/05/2020 05:12 pm ----------------------------------------------------------------------  [place] MFM FETAL BPP WO NON STRESS  Result Date: 05/29/2020 ----------------------------------------------------------------------  OBSTETRICS REPORT                       (Signed Final 05/29/2020 02:10 pm) ---------------------------------------------------------------------- Patient Info  ID #:       [id number]                          D.O.B.:  1981/01/24 (39 yrs)  Name:       Terri Bradford                   Visit Date: 05/29/2020 01:27 pm ---------------------------------------------------------------------- Performed By  Attending:        Tama High MD        Ref. Address:     [facility]                                                             [place]  Performed By:     Novella Rob        Location:         [facility]  RD[facility]                                     [facility] at                                                             [place] [facility]                                                             [facility]  Referred By:      Griffin Basil MD ---------------------------------------------------------------------- Orders  #  Description                           Code        Ordered By  1  [place] MFM FETAL BPP WO NON               76819.01    RAVI SHANKAR     STRESS  2  [place] MFM UA CORD DOPPLER                03559.74    RAVI [facility] ----------------------------------------------------------------------  #  Order #                     Accession #                Episode #  1  [id number]                   [id number]                 [id number]  2  [id number]                   [id number]                 [id number] ---------------------------------------------------------------------- Indications  Polyhydramnios, third trimester, antepartum    O40.3XX0  condition or complication, unspecified fetus  Advanced maternal age multigravida 11+,        O13.523  third trimester  Pre-existing diabetes, type 2, in pregnancy,   O24.113  third trimester (metformin)  Tobacco use complicating pregnancy, third      O99.333  trimester  Hypertension - Chronic/Pre-existing            O10.019  (Labetalol)  [redacted] weeks gestation of pregnancy                Z3A.32  Poor obstetric history: Previous IUFD          O09.299  (stillbirth)  Thrombocytopenia affecting pregnancy,          O99.119, D69.6  antepartum  History of cesarean delivery, currently        O69.219  pregnant  Maternal care for known or suspected poor      O36.5930  fetal growth, third trimester, not applicable or  unspecified IUGR  Encounter for other antenatal screening        Z36.2  follow-up  (low risk NIPS, neg Horizon)  ---------------------------------------------------------------------- Vital Signs                                                 Height:        5'3" ---------------------------------------------------------------------- Fetal Evaluation  Num Of Fetuses:         1  Fetal Heart Rate(bpm):  138  Cardiac Activity:       Observed  Presentation:           Cephalic  Placenta:               Anterior  P. Cord Insertion:      Previously Visualized  Amniotic Fluid  AFI FV:      Polyhydramnios  AFI Sum(cm)     %Tile       Largest Pocket(cm)  26.47           96          10.69  RUQ(cm)       RLQ(cm)       LUQ(cm)        LLQ(cm)  6.76          5.42          10.69          3.6 ---------------------------------------------------------------------- Biophysical Evaluation  Amniotic F.V:   Within normal limits       F. Tone:        Observed  F. Movement:    Observed                   Score:          8/8  F. Breathing:   Observed ---------------------------------------------------------------------- OB History  Gravidity:    4         Term:   3  Living:       2 ---------------------------------------------------------------------- Gestational Age  LMP:           32w 5d        Date:  10/13/19                 EDD:   07/19/20  Best:          Milderd Meager 5d     Det. By:  LMP  (10/13/19)          EDD:   07/19/20 ---------------------------------------------------------------------- Anatomy  Thoracic:              Appears normal         Stomach:                Appears normal, left                                                                        sided  Diaphragm:             Appears normal         Bladder:  Appears normal ---------------------------------------------------------------------- Doppler - Fetal Vessels  Umbilical Artery   S/D     %tile      RI    %tile   3.09       76    0.68       80 ---------------------------------------------------------------------- Cervix Uterus Adnexa  Cervix  Not visualized (advanced GA  >24wks) ---------------------------------------------------------------------- Impression  Chronic hypertension.  Patient takes labetalol 200 mg twice  daily.  Blood pressures today at her office were 142/92,  137/92 and repeat blood pressure 126/76 mmHg.  Patient has diagnosis of pregestational diabetes and her  most recent hemoglobin A1c was 4.8%.  She takes Metformin  500 mg twice daily.  Previously seen fetal growth restriction had resolved.  On ultrasound, mild polyhydramnios is seen.Antenatal testing  is reassuring. BPP 8/8.  Umbilical artery Doppler showed  normal forward diastolic flow.  Cephalic presentation. ---------------------------------------------------------------------- Recommendations  -Fetal growth next week. If normal fetal growth is seen, UA  Doppler studies to be discontinued.  -Continue weekly BPP till delivery. ----------------------------------------------------------------------                  Tama High, MD Electronically Signed Final Report   05/29/2020 02:10 pm ----------------------------------------------------------------------  [place] MFM OB FOLLOW UP  Result Date: 06/24/2020 ----------------------------------------------------------------------  OBSTETRICS REPORT                       (Signed Final 06/24/2020 12:19 pm) ---------------------------------------------------------------------- Patient Info  ID #:       [id number]                          D.O.B.:  28-Apr-1981 (39 yrs)  Name:       Terri Bradford                   Visit Date: 06/24/2020 10:41 am ---------------------------------------------------------------------- Performed By  Attending:        Sander Nephew      Ref. [facility]  Performed By:     Corky Crafts             Location:         [facility],R[facility]                                  [facility]  Referred By:      Griffin Basil MD ---------------------------------------------------------------------- Orders  #  Description                           Code        Ordered By  1  [place] MFM FETAL BPP WO NON               76819.01    YU FANG     STRESS  2  [place] MFM OB FOLLOW UP                   76816.01    YU FANG  3  [place] MFM UA CORD DOPPLER                76820.02    YU FANG ----------------------------------------------------------------------  #  Order #                     Accession #                Episode #  1  [id number]                   [id number]                 [id number]  2  [id number]                   [id number]                 [id number]  3  [id number]                   [id number]                 [id number] ---------------------------------------------------------------------- Indications  Maternal care for known or suspected poor      O36.5930  fetal growth, third trimester, not applicable or  unspecified IUGR  [redacted] weeks gestation of pregnancy                Z3A.36  Polyhydramnios, third trimester, antepartum    O40.3XX0  condition or complication, unspecified fetus  (prev seen)  Advanced maternal age multigravida 30+,        O49.523  third trimester  Pre-existing diabetes, type 2, in pregnancy,   O24.113  third trimester (metformin)  Tobacco use complicating pregnancy, third      O99.333  trimester  Hypertension - Chronic/Pre-existing            O10.019  (Labetalol)  Poor obstetric history: Previous IUFD          O09.299  (stillbirth)  Thrombocytopenia affecting pregnancy,          O99.119, D69.6  antepartum  History of cesarean delivery, currently        O34.219  pregnant  Low Risk NIPS(Negative Horizon) ---------------------------------------------------------------------- Vital Signs  Height:        5'3" ---------------------------------------------------------------------- Fetal Evaluation  Num Of Fetuses:         1  Fetal Heart Rate(bpm):  136  Cardiac Activity:       Observed  Presentation:           Cephalic  Placenta:               Anterior  P. Cord Insertion:      Previously Visualized  Amniotic Fluid  AFI FV:      Within normal limits  AFI Sum(cm)     %Tile       Largest Pocket(cm)  17.11           64          5.82  RUQ(cm)       RLQ(cm)       LUQ(cm)        LLQ(cm)  3.61          4.52          5.82           3.16 ---------------------------------------------------------------------- Biophysical Evaluation  Amniotic F.V:   Within normal limits       F. Tone:        Observed  F. Movement:    Observed                   Score:          8/8  F. Breathing:   Observed ---------------------------------------------------------------------- Biometry  BPD:      82.4  mm     G. Age:  33w 1d        1.3  %    CI:        71.58   %    70 - 86                                                          FL/HC:      20.0   %    20.1 - 22.1  HC:      310.1  mm     G. Age:  34w 5d        2.1  %    HC/AC:      0.99        0.93 - 1.11  AC:      313.4  mm     G. Age:  35w 2d         28  %    FL/BPD:     75.2   %    71 - 87  FL:         62  mm     G. Age:  32w 1d        < 1  %    FL/AC:      19.8   %    20 - 24  HUM:      52.2  mm     G. Age:  30w 3d        < 5  %  LV:        6.8  mm  Est. FW:    2361  gm      5 lb 3 oz  7  % ---------------------------------------------------------------------- OB History  Gravidity:    4         Term:   3  Living:       2 ---------------------------------------------------------------------- Gestational Age  LMP:           36w 3d        Date:  10/13/19                 EDD:   07/19/20  U/S Today:     33w 6d                                        EDD:   08/06/20  Best:          36w 3d     Det. By:  LMP  (10/13/19)          EDD:   07/19/20  ---------------------------------------------------------------------- Anatomy  Cranium:               Appears normal         Aortic Arch:            Previously seen  Cavum:                 Previously seen        Ductal Arch:            Previously seen  Ventricles:            Appears normal         Diaphragm:              Previously seen  Choroid Plexus:        Previously seen        Stomach:                Appears normal, left                                                                        sided  Cerebellum:            Previously seen        Abdomen:                Appears normal  Posterior Fossa:       Previously seen        Abdominal Wall:         Previously seen  Nuchal Fold:           Not applicable (>24    Cord Vessels:           Previously seen                         wks GA)  Face:                  Orbits and profile     Kidneys:                Appear normal  previously seen  Lips:                  Previously seen        Bladder:                Appears normal  Thoracic:              Appears normal         Spine:                  Previously seen  Heart:                 Appears normal         Upper Extremities:      Previously seen                         (4CH, axis, and                         situs)  RVOT:                  Previously seen        Lower Extremities:      Rt Clubfoot prev                                                                        seen  LVOT:                  Previously seen  Other:  Heels and 5th digit prev visualized. ([place]) 3VV prev          visualized. Nasal bone prev visualized. Technically difficult due to          fetal position. ---------------------------------------------------------------------- Doppler - Fetal Vessels  Umbilical Artery   S/D     %tile      RI    %tile                             ADFV    RDFV   2.59       63    0.61       68                                No      No  ---------------------------------------------------------------------- Cervix Uterus Adnexa  Cervix  Not visualized (advanced GA >24wks) ---------------------------------------------------------------------- Impression  Ms. Haefele is a 39 yo with prior FGR, Chronic hypertension  and known pregestational diabetes. .  Patient takes labetalol  200 mg twice daily. She did not take her labetalol this  morning and her blood pressures today at our office was  150/88, 141/87  She takes Metformin 500 mg once daily. She reports good  blood glucose levels. Her measurements in the prior week  are >90% normal.  Normal interval growth with measurements less than dates  again with EFW 7th%  Biophysical profile 8/8 today good fetal movement and  amniotic fluid volume  UA Dopplers are normal without evidence of AEDF or REDF.  Polyhydramnios resolved.  Lastly, she has thrombocytopenia with platelets las 75k She  is seeing a hematologist today to discuss diagnosis and  option for glucocorticoids to increase platelet count.  She sees Dr. Rip Harbour tomorrow for prenatal care and delivery  planning, she is a candidate for TOLAC.  Ms. Kawa is scheduled for delivery at 59 weeks. ---------------------------------------------------------------------- Recommendations  Delivery scheduled at 06/30/20 ----------------------------------------------------------------------               Sander Nephew, MD Electronically Signed Final Report   06/24/2020 12:19 pm ----------------------------------------------------------------------  [place] MFM OB FOLLOW UP  Result Date: 06/05/2020 ----------------------------------------------------------------------  OBSTETRICS REPORT                       (Signed Final 06/05/2020 05:12 pm) ---------------------------------------------------------------------- Patient Info  ID #:       [id number]                          D.O.B.:  04-Feb-1981 (39 yrs)  Name:       Terri Bradford                   Visit Date: 06/05/2020  02:48 pm ---------------------------------------------------------------------- Performed By  Attending:        Tama High MD        Ref. Address:     [facility]                                                             [place]  Performed By:     Jeanene Erb BS,      Location[facility]         [facility]                    RD[facility]                                     [facility]                                                             [facility]                                                             [facility]  Referred By:      Carolann Littler  BASS MD ---------------------------------------------------------------------- Orders  #  Description                           Code        Ordered By  1  [place] MFM FETAL BPP WO NON               76819.01    RAVI SHANKAR     STRESS  2  [place] MFM OB FOLLOW UP                   54656.81    RAVI [facility] ----------------------------------------------------------------------  #  Order #                     Accession #                Episode #  1  [id number]                   [id number]                 [id number]  2  [id number]                   [id number]                 [id number] ---------------------------------------------------------------------- Indications  [redacted] weeks gestation of pregnancy                Z3A.33  Polyhydramnios, third trimester, antepartum    O40.3XX0  condition or complication, unspecified fetus  Advanced maternal age multigravida 59+,        O19.523  third trimester  Pre-existing diabetes, type 2, in pregnancy,   O24.113  third trimester (metformin)  Tobacco use complicating pregnancy, third      O99.333  trimester  Hypertension - Chronic/Pre-existing            O10.019  (Labetalol)  Poor obstetric history: Previous IUFD          O09.299  (stillbirth)  Thrombocytopenia affecting pregnancy,          O99.119, D69.6  antepartum  History of cesarean delivery, currently         O44.219  pregnant  Maternal care for known or suspected poor      O36.5930  fetal growth, third trimester, not applicable or  unspecified IUGR  Low Risk NIPS(Negative Horizon) ---------------------------------------------------------------------- Vital Signs                                                 Height:        5'3" ---------------------------------------------------------------------- Fetal Evaluation  Num Of Fetuses:         1  Fetal Heart Rate(bpm):  141  Cardiac Activity:       Observed  Presentation:           Cephalic  Placenta:               Anterior  P. Cord Insertion:      Previously Visualized  Amniotic Fluid  AFI FV:      Polyhydramnios  AFI Sum(cm)     %Tile       Largest Pocket(cm)  25.04           95  10.72  RUQ(cm)       RLQ(cm)       LUQ(cm)        LLQ(cm)  5.49          4.53          10.72          4.3 ---------------------------------------------------------------------- Biophysical Evaluation  Amniotic F.V:   Pocket => 2 cm             F. Tone:        Observed  F. Movement:    Observed                   Score:          8/8  F. Breathing:   Observed ---------------------------------------------------------------------- Biometry  BPD:      80.8  mm     G. Age:  32w 3d         14  %    CI:        71.25   %    70 - 86                                                          FL/HC:      19.4   %    19.4 - 21.8  HC:      304.9  mm     G. Age:  33w 6d         20  %    HC/AC:      1.03        0.96 - 1.11  AC:      295.9  mm     G. Age:  33w 4d         49  %    FL/BPD:     73.1   %    71 - 87  FL:       59.1  mm     G. Age:  30w 6d        < 1  %    FL/AC:      20.0   %    20 - 24  Est. FW:    2035  gm      4 lb 8 oz     17  % ---------------------------------------------------------------------- OB History  Gravidity:    4         Term:   3  Living:       2 ---------------------------------------------------------------------- Gestational Age  LMP:           33w 5d        Date:  10/13/19                  EDD:   07/19/20  U/S Today:     32w 5d                                        EDD:   07/26/20  Best:          33w 5d     Det. By:  LMP  (10/13/19)          EDD:   07/19/20 ---------------------------------------------------------------------- Anatomy  Cranium:               Appears normal         Aortic Arch:            Previously seen  Cavum:                 Previously seen        Ductal Arch:            Previously seen  Ventricles:            Appears normal         Diaphragm:              Appears normal  Choroid Plexus:        Previously seen        Stomach:                Appears normal, left                                                                        sided  Cerebellum:            Previously seen        Abdomen:                Appears normal  Posterior Fossa:       Previously seen        Abdominal Wall:         Previously seen  Nuchal Fold:           Not applicable (>46    Cord Vessels:           Previously seen                         wks GA)  Face:                  Orbits and profile     Kidneys:                Appear normal                         previously seen  Lips:                  Previously seen        Bladder:                Appears normal  Thoracic:              Appears normal         Spine:                  Previously seen  Heart:                 Previously seen        Upper Extremities:      Previously seen  RVOT:                  Previously seen        Lower Extremities:      Rt Clubfoot prev  seen  LVOT:                  Previously seen  Other:  Fetus appears to be a female. Heels and 5th digit visualized. ([place]) 3VV visualized. Nasal bone visualized. Technically difficult due          to fetal position. ---------------------------------------------------------------------- Cervix Uterus Adnexa  Cervix  Not visualized (advanced GA >24wks)  ---------------------------------------------------------------------- Impression  Fetal growth is appropriate for gestational age . Mild  polyhydramnios is seen. Antenatal testing is reassuring. BPP  8/8.  This study was remotely read . ---------------------------------------------------------------------- Recommendations  -Continue weekly BPP till delivery. ----------------------------------------------------------------------                  Tama High, MD Electronically Signed Final Report   06/05/2020 05:12 pm ----------------------------------------------------------------------  [place] MFM UA CORD DOPPLER  Result Date: 06/24/2020 ----------------------------------------------------------------------  OBSTETRICS REPORT                       (Signed Final 06/24/2020 12:19 pm) ---------------------------------------------------------------------- Patient Info  ID #:       [id number]                          D.O.B.:  [birth year] (39 yrs)  Name:       Terri Bradford                   Visit Date: 06/24/2020 10:41 am ---------------------------------------------------------------------- Performed By  Attending:        Sander Nephew      Ref. [facility]  Performed By:     Corky Crafts             [facility]                                 [facility]                                                             [place] [facility]  Referred By:      Griffin Basil MD ---------------------------------------------------------------------- Orders  #  Description                           Code        Ordered By  1  [place] MFM FETAL BPP WO NON               76819.01    YU FANG     STRESS  2  [place] MFM OB FOLLOW UP                    76816.01    YU FANG  3  [place] MFM UA CORD DOPPLER                76820.02    YU FANG ----------------------------------------------------------------------  #  Order #                     Accession #                Episode #  1  [id number]                   [id number]                 [id number]  2  [id number]                   [id number]                 [id number]  3  [id number]                   [id number]                 [id number] ---------------------------------------------------------------------- Indications  Maternal care for known or suspected poor      O36.5930  fetal growth, third trimester, not applicable or  unspecified IUGR  [redacted] weeks gestation of pregnancy                Z3A.36  Polyhydramnios, third trimester, antepartum    O40.3XX0  condition or complication, unspecified fetus  (prev seen)  Advanced maternal age multigravida 34+,        O58.523  third trimester  Pre-existing diabetes, type 2, in pregnancy,   O24.113  third trimester (metformin)  Tobacco use complicating pregnancy, third      O99.333  trimester  Hypertension - Chronic/Pre-existing            O10.019  (Labetalol)  Poor obstetric history: Previous IUFD          O09.299  (stillbirth)  Thrombocytopenia affecting pregnancy,          O99.119, D69.6  antepartum  History of cesarean delivery, currently        O34.219  pregnant  Low Risk NIPS(Negative Horizon) ---------------------------------------------------------------------- Vital Signs                                                 Height:        5'3" ---------------------------------------------------------------------- Fetal Evaluation  Num Of Fetuses:         1  Fetal Heart  Rate(bpm):  136  Cardiac Activity:       Observed  Presentation:           Cephalic  Placenta:               Anterior  P. Cord Insertion:      Previously Visualized  Amniotic Fluid  AFI FV:      Within normal limits  AFI Sum(cm)     %Tile       Largest Pocket(cm)  17.11           64          5.82  RUQ(cm)        RLQ(cm)       LUQ(cm)        LLQ(cm)  3.61          4.52          5.82           3.16 ---------------------------------------------------------------------- Biophysical Evaluation  Amniotic F.V:   Within normal limits       F. Tone:        Observed  F. Movement:    Observed                   Score:          8/8  F. Breathing:   Observed ---------------------------------------------------------------------- Biometry  BPD:      82.4  mm     G. Age:  33w 1d        1.3  %    CI:        71.58   %    70 - 86                                                          FL/HC:      20.0   %    20.1 - 22.1  HC:      310.1  mm     G. Age:  34w 5d        2.1  %    HC/AC:      0.99        0.93 - 1.11  AC:      313.4  mm     G. Age:  35w 2d         28  %    FL/BPD:     75.2   %    71 - 87  FL:         62  mm     G. Age:  32w 1d        < 1  %    FL/AC:      19.8   %    20 - 24  HUM:      52.2  mm     G. Age:  30w 3d        < 5  %  LV:        6.8  mm  Est. FW:    2361  gm      5 lb 3 oz      7  % ---------------------------------------------------------------------- OB History  Gravidity:    4         Term:   3  Living:  2 ---------------------------------------------------------------------- Gestational Age  LMP:           36w 3d        Date:  10/13/19                 EDD:   07/19/20  U/S Today:     33w 6d                                        EDD:   08/06/20  Best:          36w 3d     Det. By:  LMP  (10/13/19)          EDD:   07/19/20 ---------------------------------------------------------------------- Anatomy  Cranium:               Appears normal         Aortic Arch:            Previously seen  Cavum:                 Previously seen        Ductal Arch:            Previously seen  Ventricles:            Appears normal         Diaphragm:              Previously seen  Choroid Plexus:        Previously seen        Stomach:                Appears normal, left                                                                         sided  Cerebellum:            Previously seen        Abdomen:                Appears normal  Posterior Fossa:       Previously seen        Abdominal Wall:         Previously seen  Nuchal Fold:           Not applicable (>67    Cord Vessels:           Previously seen                         wks GA)  Face:                  Orbits and profile     Kidneys:                Appear normal                         previously seen  Lips:                  Previously seen        Bladder:  Appears normal  Thoracic:              Appears normal         Spine:                  Previously seen  Heart:                 Appears normal         Upper Extremities:      Previously seen                         (4CH, axis, and                         situs)  RVOT:                  Previously seen        Lower Extremities:      Rt Clubfoot prev                                                                        seen  LVOT:                  Previously seen  Other:  Heels and 5th digit prev visualized. ([place]) 3VV prev          visualized. Nasal bone prev visualized. Technically difficult due to          fetal position. ---------------------------------------------------------------------- Doppler - Fetal Vessels  Umbilical Artery   S/D     %tile      RI    %tile                             ADFV    RDFV   2.59       63    0.61       68                                No      No ---------------------------------------------------------------------- Cervix Uterus Adnexa  Cervix  Not visualized (advanced GA >24wks) ---------------------------------------------------------------------- Impression  Ms. Stricker is a 39 yo with prior FGR, Chronic hypertension  and known pregestational diabetes. .  Patient takes labetalol  200 mg twice daily. She did not take her labetalol this  morning and her blood pressures today at our office was  150/88, 141/87  She takes Metformin 500 mg once daily. She reports good  blood glucose levels. Her  measurements in the prior week  are >90% normal.  Normal interval growth with measurements less than dates  again with EFW 7th%  Biophysical profile 8/8 today good fetal movement and  amniotic fluid volume  UA Dopplers are normal without evidence of AEDF or REDF.  Polyhydramnios resolved.  Lastly, she has thrombocytopenia with platelets las 75k She  is seeing a hematologist today to discuss diagnosis and  option for glucocorticoids to increase platelet count.  She sees Dr. Rip Harbour tomorrow for prenatal care and delivery  planning, she is  a candidate for TOLAC.  Ms. Pacha is scheduled for delivery at 64 weeks. ---------------------------------------------------------------------- Recommendations  Delivery scheduled at 06/30/20 ----------------------------------------------------------------------               Sander Nephew, MD Electronically Signed Final Report   06/24/2020 12:19 pm ----------------------------------------------------------------------  [place] MFM UA CORD DOPPLER  Result Date: 05/29/2020 ----------------------------------------------------------------------  OBSTETRICS REPORT                       (Signed Final 05/29/2020 02:10 pm) ---------------------------------------------------------------------- Patient Info  ID #:       [id number]                          D.O.B.:  [birth year] (39 yrs)  Name:       Terri Bradford                   Visit Date: 05/29/2020 01:27 pm ---------------------------------------------------------------------- Performed By  Attending:        Tama High MD        Ref. Address:     [facility]                                                             [place]  Performed By:     Novella Rob        Location[facility]                                     [facility]                                                             [place] [facility]                                                              [facility]  Referred By:      Griffin Basil MD ---------------------------------------------------------------------- Orders  #  Description                           Code  Ordered By  1  [place] MFM FETAL BPP WO NON               [id number]    RAVI SHANKAR     STRESS  2  [place] MFM UA CORD DOPPLER                76820.02    [facility] ----------------------------------------------------------------------  #  Order #                     Accession #                Episode #  1  [id number]                   [id number]                 [id number]  2  [id number]                   [id number]                 [id number] ---------------------------------------------------------------------- Indications  Polyhydramnios, third trimester, antepartum    O40.3XX0  condition or complication, unspecified fetus  Advanced maternal age multigravida 81+,        O64.523  third trimester  Pre-existing diabetes, type 2, in pregnancy,   O24.113  third trimester (metformin)  Tobacco use complicating pregnancy, third      O99.333  trimester  Hypertension - Chronic/Pre-existing            O10.019  (Labetalol)  [redacted] weeks gestation of pregnancy                Z3A.32  Poor obstetric history: Previous IUFD          O09.299  (stillbirth)  Thrombocytopenia affecting pregnancy,          O99.119, D69.6  antepartum  History of cesarean delivery, currently        O42.219  pregnant  Maternal care for known or suspected poor      O36.5930  fetal growth, third trimester, not applicable or  unspecified IUGR  Encounter for other antenatal screening        Z36.2  follow-up  (low risk NIPS, neg Horizon) ---------------------------------------------------------------------- Vital Signs                                                 Height:        5'3" ---------------------------------------------------------------------- Fetal Evaluation  Num Of Fetuses:         1  Fetal Heart Rate(bpm):  138  Cardiac Activity:        Observed  Presentation:           Cephalic  Placenta:               Anterior  P. Cord Insertion:      Previously Visualized  Amniotic Fluid  AFI FV:      Polyhydramnios  AFI Sum(cm)     %Tile       Largest Pocket(cm)  26.47           96          10.69  RUQ(cm)       RLQ(cm)       LUQ(cm)        LLQ(cm)  6.76          5.42          10.69          3.6 ---------------------------------------------------------------------- Biophysical Evaluation  Amniotic F.V:   Within normal limits       F. Tone:        Observed  F. Movement:    Observed                   Score:          8/8  F. Breathing:   Observed ---------------------------------------------------------------------- OB History  Gravidity:    4         Term:   3  Living:       2 ---------------------------------------------------------------------- Gestational Age  LMP:           32w 5d        Date:  10/13/19                 EDD:   07/19/20  Best:          Milderd Meager 5d     Det. By:  LMP  (10/13/19)          EDD:   07/19/20 ---------------------------------------------------------------------- Anatomy  Thoracic:              Appears normal         Stomach:                Appears normal, left                                                                        sided  Diaphragm:             Appears normal         Bladder:                Appears normal ---------------------------------------------------------------------- Doppler - Fetal Vessels  Umbilical Artery   S/D     %tile      RI    %tile   3.09       76    0.68       80 ---------------------------------------------------------------------- Cervix Uterus Adnexa  Cervix  Not visualized (advanced GA >24wks) ---------------------------------------------------------------------- Impression  Chronic hypertension.  Patient takes labetalol 200 mg twice  daily.  Blood pressures today at her office were 142/92,  137/92 and repeat blood pressure 126/76 mmHg.  Patient has diagnosis of pregestational diabetes and her  most  recent hemoglobin A1c was 4.8%.  She takes Metformin  500 mg twice daily.  Previously seen fetal growth restriction had resolved.  On ultrasound, mild polyhydramnios is seen.Antenatal testing  is reassuring. BPP 8/8.  Umbilical artery Doppler showed  normal forward diastolic flow.  Cephalic presentation. ---------------------------------------------------------------------- Recommendations  -Fetal growth next week. If normal fetal growth is seen, UA  Doppler studies to be discontinued.  -Continue weekly BPP till delivery. ----------------------------------------------------------------------                  Tama High, MD Electronically Signed Final Report   05/29/2020 02:10 pm ----------------------------------------------------------------------   ASSESSMENT & PLAN:   39 yo with   1) Thrombocytopenia in pregnancy nearly at term Evidence  of platelet clumping suggesting some element of pseudothrombocytopenia in addition to gestational thrombocytopenia. Anticipate actual platelet counts higher than measured platelet counts PLAN: -Discussed patient's most recent labs from 06/17/2020, mild anemia, significant thrombocytopenia.  -Platelet aggregation noted on labs suggests some element of pseudothrombocytopenia.  -Advised pt that there will be some gestational thrombocytopenia in the third trimester, which is not concerning.  -Advised pt that if PLT <80K it may prevent her from getting an epidural.  -Advised pt that venous obstruction due to an enlarged uterus could cause unilateral leg swelling.  -Recommend pt keep legs elevated and sleep in the left lateral position.  -Advised pt that smoking and pregnancy are risk factors for blood clots.  -Recommend complete smoking cessation. -Will get labs today - run in a citrate tube -Will get [place] Lower Extremity Venous Study ASAP -Will see back in 1 week via phone   FOLLOW UP: Labs today [place] lower extremity venous today/tomorrow Phone visit with Dr  Irene Limbo in 1 week  . Orders Placed This Encounter  Procedures  . CBC with Differential/Platelet    Standing Status:   Future    Standing Expiration Date:   06/24/2021  . CMP ([place] only)    Standing Status:   Future    Standing Expiration Date:   06/24/2021  . Platelet by Citrate    Standing Status:   Future    Standing Expiration Date:   06/24/2021  . Vitamin B12    Standing Status:   Future    Standing Expiration Date:   06/24/2021  . Ferritin    Standing Status:   Future    Standing Expiration Date:   06/24/2021  . Iron and TIBC    Standing Status:   Future    Standing Expiration Date:   06/24/2021     All of the patients questions were answered with apparent satisfaction. The patient knows to call the clinic with any problems, questions or concerns.  I spent 35 mins counseling the patient face to face. The total time spent in the appointment was 45 mins minutes and more than 50% was on counseling and direct patient cares.    Sullivan Lone MD [place] [facility] Hematology/Oncology Physician [facility]  (Office):       [phone] (Work cell):  [phone] (Fax):           [phone]  06/24/2020 6:31 PM  I, Yevette Edwards, am acting as a scribe for Dr. Sullivan Lone.   .I have reviewed the above documentation for accuracy and completeness, and I agree with the above. Brunetta Genera MD   ADDENDUM  Patient did not go for labs as per plan and choose not to have labs and [place] lower extremities. F/u with Ob Gyn for further recommendations Would recommend check future platelet counts in heparin or citrate tube to avoid pseudothrombocytopenia.  Brunetta Genera MD

## 2020-06-25 ENCOUNTER — Ambulatory Visit (INDEPENDENT_AMBULATORY_CARE_PROVIDER_SITE_OTHER): Payer: Medicaid Other | Admitting: Obstetrics and Gynecology

## 2020-06-25 ENCOUNTER — Other Ambulatory Visit (HOSPITAL_COMMUNITY)
Admission: RE | Admit: 2020-06-25 | Discharge: 2020-06-25 | Disposition: A | Discharge status: Home / Self Care | Payer: Medicaid Other | Source: Ambulatory Visit | Attending: Obstetrics and Gynecology | Admitting: Obstetrics and Gynecology

## 2020-06-25 ENCOUNTER — Encounter: Payer: Self-pay | Admitting: Obstetrics and Gynecology

## 2020-06-25 VITALS — BP 138/88 | HR 80 | Wt 163.0 lb

## 2020-06-25 DIAGNOSIS — O0990 Supervision of high risk pregnancy, unspecified, unspecified trimester: Secondary | ICD-10-CM | POA: Diagnosis present

## 2020-06-25 DIAGNOSIS — D696 Thrombocytopenia, unspecified: Secondary | ICD-10-CM

## 2020-06-25 DIAGNOSIS — O24112 Pre-existing diabetes mellitus, type 2, in pregnancy, second trimester: Secondary | ICD-10-CM

## 2020-06-25 DIAGNOSIS — Z98891 History of uterine scar from previous surgery: Secondary | ICD-10-CM

## 2020-06-25 DIAGNOSIS — O09523 Supervision of elderly multigravida, third trimester: Secondary | ICD-10-CM

## 2020-06-25 DIAGNOSIS — O10919 Unspecified pre-existing hypertension complicating pregnancy, unspecified trimester: Secondary | ICD-10-CM

## 2020-06-25 MED ORDER — DEXAMETHASONE 0.75 MG PO TABS: 20.0000 mg | ORAL_TABLET | Freq: Two times a day (BID) | ORAL | 0 refills | Status: DC

## 2020-06-25 NOTE — Patient Instructions (Signed)

## 2020-06-25 NOTE — Progress Notes (Signed)
Pt is doing well today.  Pt was seen by Oncology yesterday regarding plt count, was told she needed additional labs but none were drawn.

## 2020-06-25 NOTE — Progress Notes (Signed)
Subjective:  Terri Bradford is a 39 y.o. L3Y1017 at [redacted]w[redacted]d being seen today for ongoing prenatal care.  She is currently monitored for the following issues for this high-risk pregnancy and has Type 2 diabetes mellitus in pregnancy, second trimester; Supervision of high risk pregnancy, antepartum; History of intrauterine fetal death in previous pregnancy; Chronic hypertension in pregnancy; History of cesarean section; AMA (advanced maternal age) multigravida 35+; and Thrombocytopenia (HCC) on their problem list.  Patient reports general discomforts of pregnancy.  Contractions: Not present. Vag. Bleeding: None.  Movement: Present. Denies leaking of fluid.   The following portions of the patient's history were reviewed and updated as appropriate: allergies, current medications, past family history, past medical history, past social history, past surgical history and problem list. Problem list updated.  Objective:   Vitals:   06/25/20 1457 06/25/20 1506  BP: (!) 156/90 138/88  Pulse: 80   Weight: 163 lb (73.9 kg)     Fetal Status: Fetal Heart Rate (bpm): 140   Movement: Present     General:  Alert, oriented and cooperative. Patient is in no acute distress.  Skin: Skin is warm and dry. No rash noted.   Cardiovascular: Normal heart rate noted  Respiratory: Normal respiratory effort, no problems with respiration noted  Abdomen: Soft, gravid, appropriate for gestational age. Pain/Pressure: Absent     Pelvic:  Cervical exam performed        Extremities: Normal range of motion.     Mental Status: Normal mood and affect. Normal behavior. Normal judgment and thought content.   Urinalysis:      Assessment and Plan:  Pregnancy: P1W2585 at [redacted]w[redacted]d  1. Supervision of high risk pregnancy, antepartum Labor precautions  - Strep Gp B NAA - Cervicovaginal ancillary only( [place])  2. Chronic hypertension in pregnancy BP stable Continue with Labetalol  IOL on MOnday  3. Multigravida of advanced  maternal age in third trimester Stable  4. Thrombocytopenia (HCC) IOL for Monday Will start Dexamethasone for 5 days to increase plt count so pt hopefully can obtain an epidural - dexamethasone (DECADRON) 0.75 MG tablet; Take 26.5 tablets (20 mg total) by mouth 2 (two) times daily.  Dispense: 10 tablet; Refill: 0  5. Type 2 diabetes mellitus in pregnancy, second trimester CBG's in goal range, most likely will increase some with Dexamethasone  Continue with Metformin IOL on Monday  6. History of cesarean section Desires TOLAC H/O successful VBAC in the pass Consent obtained today  Term labor symptoms and general obstetric precautions including but not limited to vaginal bleeding, contractions, leaking of fluid and fetal movement were reviewed in detail with the patient. Please refer to After Visit Summary for other counseling recommendations.  No follow-ups on file.   Hermina Staggers, MD

## 2020-06-26 ENCOUNTER — Other Ambulatory Visit: Payer: Self-pay

## 2020-06-26 ENCOUNTER — Other Ambulatory Visit: Payer: Self-pay | Admitting: Obstetrics & Gynecology

## 2020-06-26 DIAGNOSIS — D696 Thrombocytopenia, unspecified: Secondary | ICD-10-CM

## 2020-06-26 DIAGNOSIS — O99119 Other diseases of the blood and blood-forming organs and certain disorders involving the immune mechanism complicating pregnancy, unspecified trimester: Secondary | ICD-10-CM

## 2020-06-26 MED ORDER — DEXAMETHASONE 20 MG PO TABS: 20.0000 mg | ORAL_TABLET | Freq: Two times a day (BID) | ORAL | 0 refills | Status: DC

## 2020-06-27 ENCOUNTER — Telehealth (HOSPITAL_COMMUNITY): Payer: Self-pay | Admitting: *Deleted

## 2020-06-27 ENCOUNTER — Encounter (HOSPITAL_COMMUNITY): Payer: Self-pay | Admitting: *Deleted

## 2020-06-27 LAB — STREP GP B NAA: Strep Gp B NAA: NEGATIVE

## 2020-06-27 NOTE — Telephone Encounter (Signed)
Preadmission screen  

## 2020-06-28 ENCOUNTER — Other Ambulatory Visit (HOSPITAL_COMMUNITY): Admission: RE | Admit: 2020-06-28 | Payer: Medicaid Other | Source: Ambulatory Visit

## 2020-06-28 ENCOUNTER — Other Ambulatory Visit: Payer: Self-pay | Admitting: Advanced Practice Midwife

## 2020-06-29 ENCOUNTER — Encounter (HOSPITAL_COMMUNITY): Payer: Self-pay | Admitting: Obstetrics & Gynecology

## 2020-06-29 ENCOUNTER — Other Ambulatory Visit: Payer: Self-pay

## 2020-06-29 ENCOUNTER — Inpatient Hospital Stay (HOSPITAL_COMMUNITY)
Admission: AD | Admit: 2020-06-29 | Discharge: 2020-07-04 | DRG: 784 | Disposition: A | Discharge status: Home / Self Care | Payer: Medicaid Other | Attending: Obstetrics and Gynecology | Admitting: Obstetrics and Gynecology

## 2020-06-29 DIAGNOSIS — Z7984 Long term (current) use of oral hypoglycemic drugs: Secondary | ICD-10-CM | POA: Diagnosis not present

## 2020-06-29 DIAGNOSIS — D62 Acute posthemorrhagic anemia: Secondary | ICD-10-CM | POA: Diagnosis not present

## 2020-06-29 DIAGNOSIS — O1414 Severe pre-eclampsia complicating childbirth: Secondary | ICD-10-CM | POA: Diagnosis not present

## 2020-06-29 DIAGNOSIS — D696 Thrombocytopenia, unspecified: Secondary | ICD-10-CM | POA: Diagnosis present

## 2020-06-29 DIAGNOSIS — Z20822 Contact with and (suspected) exposure to covid-19: Secondary | ICD-10-CM | POA: Diagnosis present

## 2020-06-29 DIAGNOSIS — O2412 Pre-existing diabetes mellitus, type 2, in childbirth: Principal | ICD-10-CM | POA: Diagnosis present

## 2020-06-29 DIAGNOSIS — O34211 Maternal care for low transverse scar from previous cesarean delivery: Secondary | ICD-10-CM | POA: Diagnosis present

## 2020-06-29 DIAGNOSIS — O9912 Other diseases of the blood and blood-forming organs and certain disorders involving the immune mechanism complicating childbirth: Secondary | ICD-10-CM | POA: Diagnosis present

## 2020-06-29 DIAGNOSIS — Z3A37 37 weeks gestation of pregnancy: Secondary | ICD-10-CM | POA: Diagnosis not present

## 2020-06-29 DIAGNOSIS — O1002 Pre-existing essential hypertension complicating childbirth: Secondary | ICD-10-CM | POA: Diagnosis present

## 2020-06-29 DIAGNOSIS — O9081 Anemia of the puerperium: Secondary | ICD-10-CM | POA: Diagnosis not present

## 2020-06-29 DIAGNOSIS — F17210 Nicotine dependence, cigarettes, uncomplicated: Secondary | ICD-10-CM | POA: Diagnosis present

## 2020-06-29 DIAGNOSIS — N994 Postprocedural pelvic peritoneal adhesions: Secondary | ICD-10-CM | POA: Diagnosis not present

## 2020-06-29 DIAGNOSIS — O99334 Smoking (tobacco) complicating childbirth: Secondary | ICD-10-CM | POA: Diagnosis present

## 2020-06-29 DIAGNOSIS — E119 Type 2 diabetes mellitus without complications: Secondary | ICD-10-CM | POA: Diagnosis present

## 2020-06-29 DIAGNOSIS — O326XX0 Maternal care for compound presentation, not applicable or unspecified: Secondary | ICD-10-CM | POA: Diagnosis present

## 2020-06-29 DIAGNOSIS — D693 Immune thrombocytopenic purpura: Secondary | ICD-10-CM | POA: Diagnosis present

## 2020-06-29 DIAGNOSIS — O99892 Other specified diseases and conditions complicating childbirth: Secondary | ICD-10-CM

## 2020-06-29 DIAGNOSIS — O114 Pre-existing hypertension with pre-eclampsia, complicating childbirth: Secondary | ICD-10-CM | POA: Diagnosis present

## 2020-06-29 DIAGNOSIS — O09523 Supervision of elderly multigravida, third trimester: Secondary | ICD-10-CM

## 2020-06-29 DIAGNOSIS — Z90710 Acquired absence of both cervix and uterus: Secondary | ICD-10-CM | POA: Diagnosis not present

## 2020-06-29 LAB — CBC
HCT: 34.3 % — ABNORMAL LOW (ref 36.0–46.0)
Hemoglobin: 11.5 g/dL — ABNORMAL LOW (ref 12.0–15.0)
MCH: 34.2 pg — ABNORMAL HIGH (ref 26.0–34.0)
MCHC: 33.5 g/dL (ref 30.0–36.0)
MCV: 102.1 fL — ABNORMAL HIGH (ref 80.0–100.0)
Platelets: 76 10*3/uL — ABNORMAL LOW (ref 150–400)
RBC: 3.36 MIL/uL — ABNORMAL LOW (ref 3.87–5.11)
RDW: 15.3 % (ref 11.5–15.5)
WBC: 7.5 10*3/uL (ref 4.0–10.5)
nRBC: 0 % (ref 0.0–0.2)

## 2020-06-29 LAB — COMPREHENSIVE METABOLIC PANEL
ALT: 15 U/L (ref 0–44)
AST: 25 U/L (ref 15–41)
Albumin: 2.9 g/dL — ABNORMAL LOW (ref 3.5–5.0)
Alkaline Phosphatase: 131 U/L — ABNORMAL HIGH (ref 38–126)
Anion gap: 11 (ref 5–15)
BUN: 5 mg/dL — ABNORMAL LOW (ref 6–20)
CO2: 19 mmol/L — ABNORMAL LOW (ref 22–32)
Calcium: 8.9 mg/dL (ref 8.9–10.3)
Chloride: 109 mmol/L (ref 98–111)
Creatinine, Ser: 0.67 mg/dL (ref 0.44–1.00)
GFR, Estimated: >60 mL/min (ref >60)
Glucose, Bld: 131 mg/dL — ABNORMAL HIGH (ref 70–99)
Potassium: 3.5 mmol/L (ref 3.5–5.1)
Sodium: 139 mmol/L (ref 135–145)
Total Bilirubin: 1.3 mg/dL — ABNORMAL HIGH (ref 0.3–1.2)
Total Protein: 5.8 g/dL — ABNORMAL LOW (ref 6.5–8.1)

## 2020-06-29 LAB — PROTEIN / CREATININE RATIO, URINE
Creatinine, Urine: 198.53 mg/dL
Protein Creatinine Ratio: 1.46 mg/mg{Cre} — ABNORMAL HIGH (ref 0.00–0.15)
Total Protein, Urine: 289 mg/dL

## 2020-06-29 LAB — GLUCOSE, CAPILLARY: Glucose-Capillary: 121 mg/dL — ABNORMAL HIGH (ref 70–99)

## 2020-06-29 MED ORDER — OXYTOCIN BOLUS FROM INFUSION: 333.0000 mL | Freq: Once | INTRAVENOUS | Status: DC

## 2020-06-29 MED ORDER — ONDANSETRON HCL 4 MG/2ML IJ SOLN: 4.0000 mg | Freq: Four times a day (QID) | INTRAMUSCULAR | Status: DC | PRN

## 2020-06-29 MED ORDER — FENTANYL CITRATE (PF) 100 MCG/2ML IJ SOLN: 100.0000 ug | INTRAMUSCULAR | Status: DC | PRN

## 2020-06-29 MED ORDER — OXYCODONE-ACETAMINOPHEN 5-325 MG PO TABS: 1.0000 | ORAL_TABLET | ORAL | Status: DC | PRN

## 2020-06-29 MED ORDER — SOD CITRATE-CITRIC ACID 500-334 MG/5ML PO SOLN
30.0000 mL | ORAL | Status: DC | PRN
  Filled 2020-06-29: qty 15

## 2020-06-29 MED ORDER — LACTATED RINGERS IV SOLN: 500.0000 mL | INTRAVENOUS | Status: DC | PRN

## 2020-06-29 MED ORDER — FENTANYL CITRATE (PF) 100 MCG/2ML IJ SOLN: 50.0000 ug | INTRAMUSCULAR | Status: DC | PRN

## 2020-06-29 MED ORDER — OXYCODONE-ACETAMINOPHEN 5-325 MG PO TABS: 2.0000 | ORAL_TABLET | ORAL | Status: DC | PRN

## 2020-06-29 MED ORDER — ACETAMINOPHEN 325 MG PO TABS: 650.0000 mg | ORAL_TABLET | ORAL | Status: DC | PRN

## 2020-06-29 MED ORDER — LACTATED RINGERS IV SOLN: INTRAVENOUS | Status: DC

## 2020-06-29 MED ORDER — LIDOCAINE HCL (PF) 1 % IJ SOLN: 30.0000 mL | INTRAMUSCULAR | Status: DC | PRN

## 2020-06-29 MED ORDER — OXYTOCIN-SODIUM CHLORIDE 30-0.9 UT/500ML-% IV SOLN: 2.5000 [IU]/h | INTRAVENOUS | Status: DC

## 2020-06-29 NOTE — MAU Note (Signed)
Pt stated when she went to the BR tonight she noticed blood when she wipd and som blood in the toilet. Waited a few minutes and went again and still had blood when wiping. Good fetal movement felt. Denies any pain or ctx at this time.

## 2020-06-29 NOTE — H&P (Signed)
LABOR AND DELIVERY ADMISSION HISTORY AND PHYSICAL NOTE  Terri Bradford is a 39 y.o. female [id number] with IUP at [redacted]w[redacted]d by LMP presenting to MAU initially for vaginal spotting tonight. Patient reports she has dark red spotting when she wiped. Denies having contractions or cramping prior to vaginal bleeding. Patient was scheduled for IOL in the morning, we kept her due to her history and vaginal spotting. She is being induced for T2DM, CHTN, AMA, and Thrombocytopenia. She reports positive fetal movement. She denies leakage of fluid.  Prenatal History/Complications: PNC at [facility] Pregnancy complications:  - Thrombocytopenia, has been taking dexamethasone since 11/24 - Type 2 DM, on metformin, patient reports she last took 2 days ago  - Hx of IUFD  - hx of C/S in 2000, successful VBAC in 2014 - [place], on labetalol - patient reports only taking $RemoveBef'200mg'OknTnxrpLR$  daily instead of BID  Past Medical History: Past Medical History:  Diagnosis Date  . Anxiety   . Blood transfusion without reported diagnosis   . Diabetes mellitus without complication ([place])   . Gestational diabetes   . Hypertension   . Pregnancy induced hypertension   . Thrombocytopenia, unspecified ([place])    Thrombocytopenia    Past Surgical History: Past Surgical History:  Procedure Laterality Date  . CESAREAN SECTION      Obstetrical History: OB History    Gravida  4   Para  3   Term  3   Preterm  0   AB  0   Living  2     SAB  0   TAB  0   Ectopic  0   Multiple  0   Live Births  2           Social History: Social History   Socioeconomic History  . Marital status: Single    Spouse name: Not on file  . Number of children: 2  . Years of education: Not on file  . Highest education level: Not on file  Occupational History  . Occupation: unemployed  Tobacco Use  . Smoking status: Current Every Day Smoker    Packs/day: 0.25    Types: Cigarettes  . Smokeless tobacco: Never Used  Vaping Use  .  Vaping Use: Never used  Substance and Sexual Activity  . Alcohol use: Not Currently  . Drug use: No  . Sexual activity: Yes    Birth control/protection: None  Other Topics Concern  . Not on file  Social History Narrative  . Not on file   Social Determinants of Health   Financial Resource Strain:   . Difficulty of Paying Living Expenses: Not on file  Food Insecurity:   . Worried About Charity fundraiser in the Last Year: Not on file  . Ran Out of Food in the Last Year: Not on file  Transportation Needs:   . Lack of Transportation (Medical): Not on file  . Lack of Transportation (Non-Medical): Not on file  Physical Activity:   . Days of Exercise per Week: Not on file  . Minutes of Exercise per Session: Not on file  Stress:   . Feeling of Stress : Not on file  Social Connections:   . Frequency of Communication with Friends and Family: Not on file  . Frequency of Social Gatherings with Friends and Family: Not on file  . Attends Religious Services: Not on file  . Active Member of Clubs or Organizations: Not on file  . Attends Archivist Meetings:  Not on file  . Marital Status: Not on file    Family History: Family History  Problem Relation Age of Onset  . Hypertension Mother   . COPD Mother   . Anxiety disorder Sister   . Diabetes Sister     Allergies: No Known Allergies  Medications Prior to Admission  Medication Sig Dispense Refill Last Dose  . aspirin EC 81 MG tablet Take 1 tablet (81 mg total) by mouth daily. Take after 12 weeks for prevention of preeclampsia later in pregnancy 300 tablet 2 06/29/2020 at Unknown time  . labetalol (NORMODYNE) 200 MG tablet Take 1 tablet (200 mg total) by mouth 2 (two) times daily. 60 tablet 3 06/29/2020 at Unknown time  . metFORMIN (GLUCOPHAGE) 500 MG tablet Take 1 tablet (500 mg total) by mouth 2 (two) times daily with a meal. 60 tablet 5 06/29/2020 at Unknown time  . Prenatal Vit-Fe Fumarate-FA (PREPLUS) 27-1 MG TABS  Take 1 tablet by mouth daily. 30 tablet 13 06/29/2020 at Unknown time  . Blood Pressure Monitoring (BLOOD PRESSURE KIT) DEVI 1 kit by Does not apply route once a week. Check Blood Pressure regularly and record readings into the Babys[employer] App.  Large Cuff.  DX O90.0 1 each 0   . Dexamethasone 20 MG TABS Take 20 mg by mouth 2 (two) times daily. 10 tablet 0      Review of Systems  All systems reviewed and negative except as stated in HPI  Physical Exam Blood pressure (!) 145/88, pulse 88, temperature 98.4 F (36.9 C), temperature source Oral, resp. rate 16, height _0  (1.575 m), weight 74.8 kg, last menstrual period 10/13/2019, SpO2 100 %. General appearance: alert, cooperative and no distress Lungs: clear to auscultation bilaterally Heart: regular rate and rhythm Abdomen: soft, non-tender; bowel sounds normal Extremities: No calf swelling or tenderness Presentation: cephalic Fetal monitoring: 140/moderate/+accels/ no decelerations  Uterine activity: occasional mild contractions  Dilation: 1.5 Effacement (%): 40 Station: -3 Exam by:: Wende Bushy CNM  Prenatal labs: ABO, Rh: --/--/B POS (11/28 2213) Antibody: NEG (11/28 2213) Rubella: <0.90 (06/03 1440) RPR: Non Reactive (10/07 1555)  HBsAg: Negative (06/03 1440)  HIV: Non Reactive (10/07 1555)  GC/Chlamydia: Negative GBS: Negative/-- (11/24 0356)  Genetic screening:  negative Anatomy US: normal female  Nursing Staff Provider  Office Location  [place] Dating  LMP  Language   ENGLISH Anatomy US  02-25-20  Flu Vaccine  Declined 05/08/20 Genetic Screen  NIPS: LR  AFP:   First Screen:  Quad:    TDaP vaccine   declined 05/08/20  Hgb A1C or  GTT Early 6.1 Third trimester   Rhogam     LAB RESULTS     Blood Type B/Positive/-- (06/03 1440) B positive  Feeding Plan  BOTTLE Antibody Negative (06/03 1440)negative  Contraception  UNSURE Rubella <0.90 (06/03 1440)non immune  Circumcision  YES RPR Non Reactive (10/07 1555) negative   Pediatrician   [place] HBsAg Negative (06/03 1440) negative  Support Person mother HCVAb  Negative  Prenatal Classes no HIV Non Reactive (10/07 1555)   negative  BTL Consent  GBS  (For PCN allergy, check sensitivities)   VBAC Consent  Pap normal    Hgb Electro    BP Cuff Already has BP Cuff CF negative    SMA negative    Waterbirth  _1  Class _2  Consent _3  CNM visit    Induction  _4  Orders Entered _5 Foley Y/N    Prenatal  Transfer Tool  Maternal Diabetes: Yes:  Diabetes Type:  Pre-pregnancy Genetic Screening: Normal Maternal Ultrasounds/Referrals: Normal Fetal Ultrasounds or other Referrals:  Fetal echo Maternal Substance Abuse:  No Significant Maternal Medications:  Meds include: Other:  Metformin and Labetalol Significant Maternal Lab Results: Group B Strep negative  Results for orders placed or performed during the hospital encounter of 06/29/20 (from the past 24 hour(s))  Protein / creatinine ratio, urine   Collection Time: 06/29/20  9:44 PM  Result Value Ref Range   Creatinine, Urine 198.53 mg/dL   Total Protein, Urine PENDING mg/dL   Protein Creatinine Ratio PENDING 0.00 - 0.15 mg/mg[Cre]  CBC   Collection Time: 06/29/20 10:12 PM  Result Value Ref Range   WBC 7.5 4.0 - 10.5 K/uL   RBC 3.36 (L) 3.87 - 5.11 MIL/uL   Hemoglobin 11.5 (L) 12.0 - 15.0 g/dL   HCT 34.3 (L) 36 - 46 %   MCV 102.1 (H) 80.0 - 100.0 fL   MCH 34.2 (H) 26.0 - 34.0 pg   MCHC 33.5 30.0 - 36.0 g/dL   RDW 15.3 11.5 - 15.5 %   Platelets 76 (L) 150 - 400 K/uL   nRBC 0.0 0.0 - 0.2 %  Comprehensive metabolic panel   Collection Time: 06/29/20 10:12 PM  Result Value Ref Range   Sodium 139 135 - 145 mmol/L   Potassium 3.5 3.5 - 5.1 mmol/L   Chloride 109 98 - 111 mmol/L   CO2 19 (L) 22 - 32 mmol/L   Glucose, Bld 131 (H) 70 - 99 mg/dL   BUN 5 (L) 6 - 20 mg/dL   Creatinine, Ser 0.67 0.44 - 1.00 mg/dL   Calcium 8.9 8.9 - 10.3 mg/dL   Total Protein 5.8 (L) 6.5 - 8.1 g/dL   Albumin 2.9  (L) 3.5 - 5.0 g/dL   AST 25 15 - 41 U/L   ALT 15 0 - 44 U/L   Alkaline Phosphatase 131 (H) 38 - 126 U/L   Total Bilirubin 1.3 (H) 0.3 - 1.2 mg/dL   GFR, Estimated >60 >60 mL/min   Anion gap 11 5 - 15  Type and screen   Collection Time: 06/29/20 10:13 PM  Result Value Ref Range   ABO/RH(D) B POS    Antibody Screen NEG    Sample Expiration      07/02/2020,2359 Performed at [facility], [address], [place], [place] [address]   Glucose, capillary   Collection Time: 06/29/20 11:13 PM  Result Value Ref Range   Glucose-Capillary 121 (H) 70 - 99 mg/dL    Patient Active Problem List   Diagnosis Date Noted  . Thrombocytopenia ([place]) 01/04/2020  . History of intrauterine fetal death in previous pregnancy 01/20/2020  . Chronic hypertension in pregnancy 01-20-20  . History of cesarean section 01/20/2020  . AMA (advanced maternal age) multigravida 35+ 01-20-2020  . Supervision of high risk pregnancy, antepartum 12/26/2019  . Type 2 diabetes mellitus in pregnancy, second trimester 12/12/2014    Assessment: Tkeyah MARTHE DANT is a 39 y.o. [id number] at 31w1dhere for IOL   #Labor: IOL with FB placed @ 2330, will hold on pitocin at this time due to occasional contractions  #Pain: IV pain medication, will discuss with anesthesia on epidural d/t Thrombocytopenia #FWB: Cat I  #ID:  GBS negative #MOF: both  #MOC:unsure  #Circ:  Yes  RLajean Manes CNM 06/30/2020, 12:50 AM

## 2020-06-30 ENCOUNTER — Inpatient Hospital Stay (HOSPITAL_COMMUNITY): Payer: Medicaid Other

## 2020-06-30 ENCOUNTER — Inpatient Hospital Stay (HOSPITAL_COMMUNITY): Payer: Medicaid Other | Admitting: Anesthesiology

## 2020-06-30 ENCOUNTER — Inpatient Hospital Stay (HOSPITAL_COMMUNITY)
Admission: AD | Admit: 2020-06-30 | Payer: Medicaid Other | Source: Home / Self Care | Admitting: Obstetrics and Gynecology

## 2020-06-30 ENCOUNTER — Encounter (HOSPITAL_COMMUNITY)
Admission: AD | Disposition: A | Discharge status: Home / Self Care | Payer: Self-pay | Source: Home / Self Care | Attending: Obstetrics and Gynecology

## 2020-06-30 HISTORY — PX: ABDOMINAL HYSTERECTOMY: SHX81

## 2020-06-30 HISTORY — PX: CYSTOSCOPY: SHX5120

## 2020-06-30 LAB — POCT I-STAT 7, (LYTES, BLD GAS, ICA,H+H)
Acid-base deficit: 9 mmol/L — ABNORMAL HIGH (ref 0.0–2.0)
Acid-base deficit: 6 mmol/L — ABNORMAL HIGH (ref 0.0–2.0)
Bicarbonate: 19.1 mmol/L — ABNORMAL LOW (ref 20.0–28.0)
Bicarbonate: 20.3 mmol/L (ref 20.0–28.0)
Calcium, Ion: 0.94 mmol/L — ABNORMAL LOW (ref 1.15–1.40)
Calcium, Ion: 1.07 mmol/L — ABNORMAL LOW (ref 1.15–1.40)
HCT: 23 % — ABNORMAL LOW (ref 36.0–46.0)
HCT: 22 % — ABNORMAL LOW (ref 36.0–46.0)
Hemoglobin: 7.8 g/dL — ABNORMAL LOW (ref 12.0–15.0)
Hemoglobin: 7.5 g/dL — ABNORMAL LOW (ref 12.0–15.0)
O2 Saturation: 99 %
O2 Saturation: 99 %
Patient temperature: 35
Patient temperature: 35
Potassium: 4 mmol/L (ref 3.5–5.1)
Potassium: 4.2 mmol/L (ref 3.5–5.1)
Sodium: 137 mmol/L (ref 135–145)
Sodium: 136 mmol/L (ref 135–145)
TCO2: 21 mmol/L — ABNORMAL LOW (ref 22–32)
TCO2: 22 mmol/L (ref 22–32)
pCO2 arterial: 45.9 mmHg (ref 32.0–48.0)
pCO2 arterial: 41.6 mmHg (ref 32.0–48.0)
pH, Arterial: 7.217 — ABNORMAL LOW (ref 7.350–7.450)
pH, Arterial: 7.285 — ABNORMAL LOW (ref 7.350–7.450)
pO2, Arterial: 133 mmHg — ABNORMAL HIGH (ref 83.0–108.0)
pO2, Arterial: 153 mmHg — ABNORMAL HIGH (ref 83.0–108.0)

## 2020-06-30 LAB — COMPREHENSIVE METABOLIC PANEL
ALT: 18 U/L (ref 0–44)
ALT: 9 U/L (ref 0–44)
AST: 28 U/L (ref 15–41)
AST: 17 U/L (ref 15–41)
Albumin: 3 g/dL — ABNORMAL LOW (ref 3.5–5.0)
Albumin: 1.6 g/dL — ABNORMAL LOW (ref 3.5–5.0)
Alkaline Phosphatase: 152 U/L — ABNORMAL HIGH (ref 38–126)
Alkaline Phosphatase: 53 U/L (ref 38–126)
Anion gap: 12 (ref 5–15)
Anion gap: 11 (ref 5–15)
BUN: <5 mg/dL — ABNORMAL LOW (ref 6–20)
BUN: <5 mg/dL — ABNORMAL LOW (ref 6–20)
CO2: 19 mmol/L — ABNORMAL LOW (ref 22–32)
CO2: 19 mmol/L — ABNORMAL LOW (ref 22–32)
Calcium: 8.9 mg/dL (ref 8.9–10.3)
Calcium: 7.1 mg/dL — ABNORMAL LOW (ref 8.9–10.3)
Chloride: 106 mmol/L (ref 98–111)
Chloride: 107 mmol/L (ref 98–111)
Creatinine, Ser: 0.63 mg/dL (ref 0.44–1.00)
Creatinine, Ser: 0.63 mg/dL (ref 0.44–1.00)
GFR, Estimated: >60 mL/min (ref >60)
GFR, Estimated: >60 mL/min (ref >60)
Glucose, Bld: 99 mg/dL (ref 70–99)
Glucose, Bld: 187 mg/dL — ABNORMAL HIGH (ref 70–99)
Potassium: 3.7 mmol/L (ref 3.5–5.1)
Potassium: 4.1 mmol/L (ref 3.5–5.1)
Sodium: 137 mmol/L (ref 135–145)
Sodium: 137 mmol/L (ref 135–145)
Total Bilirubin: 2.3 mg/dL — ABNORMAL HIGH (ref 0.3–1.2)
Total Bilirubin: 1 mg/dL (ref 0.3–1.2)
Total Protein: 6.3 g/dL — ABNORMAL LOW (ref 6.5–8.1)
Total Protein: <3 g/dL — ABNORMAL LOW (ref 6.5–8.1)

## 2020-06-30 LAB — DIC (DISSEMINATED INTRAVASCULAR COAGULATION)PANEL
D-Dimer, Quant: 4.31 ug/mL-FEU — ABNORMAL HIGH (ref 0.00–0.50)
D-Dimer, Quant: >20 ug/mL-FEU — ABNORMAL HIGH (ref 0.00–0.50)
Fibrinogen: 106 mg/dL — ABNORMAL LOW (ref 210–475)
INR: 1.7 — ABNORMAL HIGH (ref 0.8–1.2)
Platelets: 61 10*3/uL — ABNORMAL LOW (ref 150–400)
Prothrombin Time: 19.5 seconds — ABNORMAL HIGH (ref 11.4–15.2)
Smear Review: NONE SEEN
aPTT: 84 seconds — ABNORMAL HIGH (ref 24–36)
aPTT: 41 seconds — ABNORMAL HIGH (ref 24–36)

## 2020-06-30 LAB — CBC
HCT: 36 % (ref 36.0–46.0)
HCT: 37.4 % (ref 36.0–46.0)
HCT: 25.8 % — ABNORMAL LOW (ref 36.0–46.0)
HCT: 29.7 % — ABNORMAL LOW (ref 36.0–46.0)
Hemoglobin: 11.9 g/dL — ABNORMAL LOW (ref 12.0–15.0)
Hemoglobin: 12.6 g/dL (ref 12.0–15.0)
Hemoglobin: 8.4 g/dL — ABNORMAL LOW (ref 12.0–15.0)
Hemoglobin: 9.9 g/dL — ABNORMAL LOW (ref 12.0–15.0)
MCH: 34.1 pg — ABNORMAL HIGH (ref 26.0–34.0)
MCH: 34.3 pg — ABNORMAL HIGH (ref 26.0–34.0)
MCH: 31.2 pg (ref 26.0–34.0)
MCH: 30.9 pg (ref 26.0–34.0)
MCHC: 33.1 g/dL (ref 30.0–36.0)
MCHC: 33.7 g/dL (ref 30.0–36.0)
MCHC: 32.6 g/dL (ref 30.0–36.0)
MCHC: 33.3 g/dL (ref 30.0–36.0)
MCV: 103.2 fL — ABNORMAL HIGH (ref 80.0–100.0)
MCV: 101.9 fL — ABNORMAL HIGH (ref 80.0–100.0)
MCV: 95.9 fL (ref 80.0–100.0)
MCV: 92.8 fL (ref 80.0–100.0)
Platelets: 64 10*3/uL — ABNORMAL LOW (ref 150–400)
Platelets: 62 10*3/uL — ABNORMAL LOW (ref 150–400)
Platelets: 61 10*3/uL — ABNORMAL LOW (ref 150–400)
Platelets: 73 10*3/uL — ABNORMAL LOW (ref 150–400)
RBC: 3.49 MIL/uL — ABNORMAL LOW (ref 3.87–5.11)
RBC: 3.67 MIL/uL — ABNORMAL LOW (ref 3.87–5.11)
RBC: 2.69 MIL/uL — ABNORMAL LOW (ref 3.87–5.11)
RBC: 3.2 MIL/uL — ABNORMAL LOW (ref 3.87–5.11)
RDW: 15.5 % (ref 11.5–15.5)
RDW: 15.3 % (ref 11.5–15.5)
RDW: 18.1 % — ABNORMAL HIGH (ref 11.5–15.5)
RDW: 15.9 % — ABNORMAL HIGH (ref 11.5–15.5)
WBC: 12 10*3/uL — ABNORMAL HIGH (ref 4.0–10.5)
WBC: 23.6 10*3/uL — ABNORMAL HIGH (ref 4.0–10.5)
WBC: 16.1 10*3/uL — ABNORMAL HIGH (ref 4.0–10.5)
WBC: 12.9 10*3/uL — ABNORMAL HIGH (ref 4.0–10.5)
nRBC: 0 % (ref 0.0–0.2)
nRBC: 0 % (ref 0.0–0.2)
nRBC: 0.1 % (ref 0.0–0.2)
nRBC: 0.2 % (ref 0.0–0.2)

## 2020-06-30 LAB — POCT I-STAT EG7
Acid-base deficit: 8 mmol/L — ABNORMAL HIGH (ref 0.0–2.0)
Bicarbonate: 19.1 mmol/L — ABNORMAL LOW (ref 20.0–28.0)
Calcium, Ion: 1.14 mmol/L — ABNORMAL LOW (ref 1.15–1.40)
HCT: 18 % — ABNORMAL LOW (ref 36.0–46.0)
Hemoglobin: 6.1 g/dL — CL (ref 12.0–15.0)
O2 Saturation: 93 %
Potassium: 3.9 mmol/L (ref 3.5–5.1)
Sodium: 136 mmol/L (ref 135–145)
TCO2: 20 mmol/L — ABNORMAL LOW (ref 22–32)
pCO2, Ven: 47.8 mmHg (ref 44.0–60.0)
pH, Ven: 7.209 — ABNORMAL LOW (ref 7.250–7.430)
pO2, Ven: 81 mmHg — ABNORMAL HIGH (ref 32.0–45.0)

## 2020-06-30 LAB — GLUCOSE, CAPILLARY
Glucose-Capillary: 84 mg/dL (ref 70–99)
Glucose-Capillary: 91 mg/dL (ref 70–99)
Glucose-Capillary: 80 mg/dL (ref 70–99)
Glucose-Capillary: 108 mg/dL — ABNORMAL HIGH (ref 70–99)

## 2020-06-30 LAB — CERVICOVAGINAL ANCILLARY ONLY
Chlamydia: NEGATIVE
Comment: NEGATIVE
Comment: NORMAL
Neisseria Gonorrhea: NEGATIVE

## 2020-06-30 LAB — RESP PANEL BY RT-PCR (FLU A&B, COVID) ARPGX2
Influenza A by PCR: NEGATIVE
Influenza B by PCR: NEGATIVE
SARS Coronavirus 2 by RT PCR: NEGATIVE

## 2020-06-30 LAB — POSTPARTUM HEMORRHAGE PROTOCOL (BB NOTIFICATION)

## 2020-06-30 LAB — PREPARE RBC (CROSSMATCH)

## 2020-06-30 LAB — RPR: RPR Ser Ql: NONREACTIVE

## 2020-06-30 LAB — MASSIVE TRANSFUSION PROTOCOL ORDER (BLOOD BANK NOTIFICATION)

## 2020-06-30 SURGERY — Surgical Case
Anesthesia: General

## 2020-06-30 MED ORDER — EPHEDRINE 5 MG/ML INJ: 10.0000 mg | INTRAVENOUS | Status: DC | PRN

## 2020-06-30 MED ORDER — OXYTOCIN-SODIUM CHLORIDE 30-0.9 UT/500ML-% IV SOLN
INTRAVENOUS | Status: DC | PRN
  Administered 2020-06-30: 300 mL via INTRAVENOUS
  Administered 2020-06-30: 200 mL via INTRAVENOUS

## 2020-06-30 MED ORDER — CEFAZOLIN SODIUM-DEXTROSE 2-4 GM/100ML-% IV SOLN
INTRAVENOUS | Status: AC
  Filled 2020-06-30: qty 100

## 2020-06-30 MED ORDER — SODIUM BICARBONATE 8.4 % IV SOLN
INTRAVENOUS | Status: AC
  Filled 2020-06-30: qty 50

## 2020-06-30 MED ORDER — SODIUM CHLORIDE 0.9 % IR SOLN
Status: DC | PRN
  Administered 2020-06-30: 1000 mL

## 2020-06-30 MED ORDER — MAGNESIUM SULFATE BOLUS VIA INFUSION
4.0000 g | Freq: Once | INTRAVENOUS | Status: AC
  Administered 2020-06-30: 4 g via INTRAVENOUS
  Filled 2020-06-30: qty 1000

## 2020-06-30 MED ORDER — SOD CITRATE-CITRIC ACID 500-334 MG/5ML PO SOLN: 30.0000 mL | ORAL | Status: DC

## 2020-06-30 MED ORDER — HYDROMORPHONE HCL 1 MG/ML IJ SOLN
INTRAMUSCULAR | Status: DC | PRN
Start: 2020-06-30 — End: 2020-07-01
  Administered 2020-06-30 (×2): .5 mg via INTRAVENOUS

## 2020-06-30 MED ORDER — SODIUM CHLORIDE 0.9% IV SOLUTION: Freq: Once | INTRAVENOUS | Status: DC

## 2020-06-30 MED ORDER — NALOXONE HCL 0.4 MG/ML IJ SOLN: 0.4000 mg | INTRAMUSCULAR | Status: DC | PRN

## 2020-06-30 MED ORDER — TRANEXAMIC ACID-NACL 1000-0.7 MG/100ML-% IV SOLN
INTRAVENOUS | Status: DC | PRN
  Administered 2020-06-30: 1000 mg via INTRAVENOUS

## 2020-06-30 MED ORDER — FENTANYL-BUPIVACAINE-NACL 0.5-0.125-0.9 MG/250ML-% EP SOLN: 12.0000 mL/h | EPIDURAL | Status: DC | PRN

## 2020-06-30 MED ORDER — LACTATED RINGERS IV SOLN: INTRAVENOUS | Status: DC | PRN

## 2020-06-30 MED ORDER — HYDROMORPHONE 1 MG/ML IV SOLN
INTRAVENOUS | Status: DC
  Administered 2020-06-30: 3.8 mg via INTRAVENOUS
  Administered 2020-06-30: 0.9 mL via INTRAVENOUS
  Filled 2020-06-30: qty 30

## 2020-06-30 MED ORDER — CEFAZOLIN SODIUM-DEXTROSE 2-3 GM-%(50ML) IV SOLR
INTRAVENOUS | Status: DC | PRN
  Administered 2020-06-30 (×2): 2 g via INTRAVENOUS

## 2020-06-30 MED ORDER — TERBUTALINE SULFATE 1 MG/ML IJ SOLN
0.2500 mg | Freq: Once | INTRAMUSCULAR | Status: AC | PRN
  Administered 2020-06-30: 0.25 mg via SUBCUTANEOUS
  Filled 2020-06-30: qty 1

## 2020-06-30 MED ORDER — HYDRALAZINE HCL 20 MG/ML IJ SOLN: 10.0000 mg | INTRAMUSCULAR | Status: DC | PRN

## 2020-06-30 MED ORDER — OXYTOCIN-SODIUM CHLORIDE 30-0.9 UT/500ML-% IV SOLN
INTRAVENOUS | Status: AC
  Filled 2020-06-30: qty 500

## 2020-06-30 MED ORDER — MIDAZOLAM HCL 2 MG/2ML IJ SOLN
INTRAMUSCULAR | Status: AC
  Filled 2020-06-30: qty 2

## 2020-06-30 MED ORDER — PROPOFOL 10 MG/ML IV BOLUS
INTRAVENOUS | Status: AC
  Filled 2020-06-30: qty 20

## 2020-06-30 MED ORDER — STERILE WATER FOR IRRIGATION IR SOLN
Status: DC | PRN
  Administered 2020-06-30: 1000 mL

## 2020-06-30 MED ORDER — LABETALOL HCL 5 MG/ML IV SOLN
80.0000 mg | INTRAVENOUS | Status: DC | PRN
  Administered 2020-07-03: 80 mg via INTRAVENOUS
  Filled 2020-06-30: qty 16

## 2020-06-30 MED ORDER — CALCIUM GLUCONATE-NACL 1-0.675 GM/50ML-% IV SOLN
INTRAVENOUS | Status: AC
  Filled 2020-06-30: qty 50

## 2020-06-30 MED ORDER — OXYTOCIN-SODIUM CHLORIDE 30-0.9 UT/500ML-% IV SOLN
1.0000 m[IU]/min | INTRAVENOUS | Status: DC
  Administered 2020-06-30 (×2): 2 m[IU]/min via INTRAVENOUS
  Filled 2020-06-30: qty 500

## 2020-06-30 MED ORDER — ROCURONIUM BROMIDE 100 MG/10ML IV SOLN
INTRAVENOUS | Status: DC | PRN
  Administered 2020-06-30: 10 mg via INTRAVENOUS
  Administered 2020-06-30: 30 mg via INTRAVENOUS

## 2020-06-30 MED ORDER — PHENYLEPHRINE 40 MCG/ML (10ML) SYRINGE FOR IV PUSH (FOR BLOOD PRESSURE SUPPORT): 80.0000 ug | PREFILLED_SYRINGE | INTRAVENOUS | Status: DC | PRN

## 2020-06-30 MED ORDER — CALCIUM GLUCONATE 10 % IV SOLN
INTRAVENOUS | Status: DC | PRN
  Administered 2020-06-30 (×2): 1000 mg via INTRAVENOUS

## 2020-06-30 MED ORDER — PHENYLEPHRINE HCL-NACL 20-0.9 MG/250ML-% IV SOLN
INTRAVENOUS | Status: AC
  Filled 2020-06-30: qty 250

## 2020-06-30 MED ORDER — PROPOFOL 10 MG/ML IV BOLUS
INTRAVENOUS | Status: DC | PRN
  Administered 2020-06-30: 200 mg via INTRAVENOUS

## 2020-06-30 MED ORDER — TRANEXAMIC ACID-NACL 1000-0.7 MG/100ML-% IV SOLN
INTRAVENOUS | Status: AC
  Filled 2020-06-30: qty 100

## 2020-06-30 MED ORDER — LACTATED RINGERS IV SOLN: INTRAVENOUS | Status: DC

## 2020-06-30 MED ORDER — PHENYLEPHRINE HCL-NACL 20-0.9 MG/250ML-% IV SOLN
INTRAVENOUS | Status: DC | PRN
  Administered 2020-06-30: 60 ug/min via INTRAVENOUS

## 2020-06-30 MED ORDER — FUROSEMIDE 10 MG/ML IJ SOLN
INTRAMUSCULAR | Status: DC | PRN
  Administered 2020-06-30 (×2): 10 mg via INTRAMUSCULAR

## 2020-06-30 MED ORDER — LABETALOL HCL 200 MG PO TABS
200.0000 mg | ORAL_TABLET | Freq: Two times a day (BID) | ORAL | Status: DC
  Administered 2020-06-30: 200 mg via ORAL
  Filled 2020-06-30: qty 1

## 2020-06-30 MED ORDER — MAGNESIUM SULFATE 40 GM/1000ML IV SOLN: INTRAVENOUS | Status: DC | PRN

## 2020-06-30 MED ORDER — SUCCINYLCHOLINE CHLORIDE 200 MG/10ML IV SOSY
PREFILLED_SYRINGE | INTRAVENOUS | Status: AC
  Filled 2020-06-30: qty 10

## 2020-06-30 MED ORDER — FENTANYL CITRATE (PF) 100 MCG/2ML IJ SOLN
INTRAMUSCULAR | Status: DC | PRN
  Administered 2020-06-30 (×2): 50 ug via INTRAVENOUS
  Administered 2020-06-30: 150 ug via INTRAVENOUS
  Administered 2020-07-01 (×2): 50 ug via INTRAVENOUS

## 2020-06-30 MED ORDER — LABETALOL HCL 5 MG/ML IV SOLN
40.0000 mg | INTRAVENOUS | Status: DC | PRN
  Administered 2020-06-30 – 2020-07-03 (×2): 40 mg via INTRAVENOUS
  Filled 2020-06-30 (×2): qty 8

## 2020-06-30 MED ORDER — NALBUPHINE HCL 10 MG/ML IJ SOLN
10.0000 mg | Freq: Once | INTRAMUSCULAR | Status: AC
  Administered 2020-06-30: 10 mg via INTRAVENOUS
  Filled 2020-06-30: qty 1

## 2020-06-30 MED ORDER — FUROSEMIDE 10 MG/ML IJ SOLN
INTRAMUSCULAR | Status: AC
  Filled 2020-06-30: qty 2

## 2020-06-30 MED ORDER — LABETALOL HCL 5 MG/ML IV SOLN
20.0000 mg | INTRAVENOUS | Status: DC | PRN
  Administered 2020-06-30 – 2020-07-03 (×2): 20 mg via INTRAVENOUS
  Filled 2020-06-30 (×2): qty 4

## 2020-06-30 MED ORDER — PHENYLEPHRINE 40 MCG/ML (10ML) SYRINGE FOR IV PUSH (FOR BLOOD PRESSURE SUPPORT)
PREFILLED_SYRINGE | INTRAVENOUS | Status: AC
  Filled 2020-06-30: qty 20

## 2020-06-30 MED ORDER — CEFAZOLIN SODIUM-DEXTROSE 2-4 GM/100ML-% IV SOLN: 2.0000 g | Freq: Once | INTRAVENOUS | Status: DC

## 2020-06-30 MED ORDER — SODIUM CHLORIDE 0.9% FLUSH: 9.0000 mL | INTRAVENOUS | Status: DC | PRN

## 2020-06-30 MED ORDER — SODIUM CHLORIDE 0.9 % IV SOLN: INTRAVENOUS | Status: DC | PRN

## 2020-06-30 MED ORDER — SODIUM BICARBONATE 8.4 % IV SOLN
INTRAVENOUS | Status: DC | PRN
  Administered 2020-06-30 (×5): 10 mL via INTRAVENOUS

## 2020-06-30 MED ORDER — SUCCINYLCHOLINE CHLORIDE 20 MG/ML IJ SOLN
INTRAMUSCULAR | Status: DC | PRN
  Administered 2020-06-30: 140 mg via INTRAVENOUS

## 2020-06-30 MED ORDER — LACTATED RINGERS IV SOLN: 500.0000 mL | Freq: Once | INTRAVENOUS | Status: DC

## 2020-06-30 MED ORDER — PHENYLEPHRINE 40 MCG/ML (10ML) SYRINGE FOR IV PUSH (FOR BLOOD PRESSURE SUPPORT)
PREFILLED_SYRINGE | INTRAVENOUS | Status: AC
  Filled 2020-06-30: qty 10

## 2020-06-30 MED ORDER — ALBUMIN HUMAN 5 % IV SOLN: INTRAVENOUS | Status: DC | PRN

## 2020-06-30 MED ORDER — MAGNESIUM SULFATE 40 GM/1000ML IV SOLN
2.0000 g/h | INTRAVENOUS | Status: DC
  Administered 2020-06-30: 2 g/h via INTRAVENOUS
  Filled 2020-06-30: qty 1000

## 2020-06-30 MED ORDER — FENTANYL CITRATE (PF) 250 MCG/5ML IJ SOLN
INTRAMUSCULAR | Status: AC
  Filled 2020-06-30: qty 5

## 2020-06-30 MED ORDER — PHENYLEPHRINE HCL (PRESSORS) 10 MG/ML IV SOLN
INTRAVENOUS | Status: DC | PRN
  Administered 2020-06-30 (×3): 80 ug via INTRAVENOUS
  Administered 2020-06-30 (×4): 120 ug via INTRAVENOUS
  Administered 2020-06-30: 40 ug via INTRAVENOUS

## 2020-06-30 MED ORDER — HYDROMORPHONE HCL 1 MG/ML IJ SOLN
INTRAMUSCULAR | Status: AC
  Filled 2020-06-30: qty 1

## 2020-06-30 MED ORDER — LACTATED RINGERS AMNIOINFUSION
INTRAVENOUS | Status: DC
  Administered 2020-06-30: 100 mL via INTRAUTERINE

## 2020-06-30 MED ORDER — MIDAZOLAM HCL 2 MG/2ML IJ SOLN
INTRAMUSCULAR | Status: DC | PRN
  Administered 2020-06-30: 2 mg via INTRAVENOUS

## 2020-06-30 MED ORDER — SODIUM CHLORIDE 0.9 % IV SOLN: 500.0000 mg | INTRAVENOUS | Status: DC

## 2020-06-30 MED ORDER — ROCURONIUM BROMIDE 10 MG/ML (PF) SYRINGE
PREFILLED_SYRINGE | INTRAVENOUS | Status: AC
  Filled 2020-06-30: qty 10

## 2020-06-30 MED ORDER — SODIUM CHLORIDE 0.9 % IV SOLN
INTRAVENOUS | Status: DC | PRN
  Administered 2020-06-30: 500 mg via INTRAVENOUS

## 2020-06-30 MED ORDER — CALCIUM CHLORIDE 10 % IV SOLN
INTRAVENOUS | Status: DC | PRN
  Administered 2020-06-30: 2 g via INTRAVENOUS

## 2020-06-30 MED ORDER — DEXAMETHASONE SODIUM PHOSPHATE 10 MG/ML IJ SOLN
INTRAMUSCULAR | Status: DC | PRN
  Administered 2020-06-30: 10 mg via INTRAVENOUS

## 2020-06-30 MED ORDER — DIPHENHYDRAMINE HCL 50 MG/ML IJ SOLN: 12.5000 mg | INTRAMUSCULAR | Status: DC | PRN

## 2020-06-30 MED ORDER — ALBUMIN HUMAN 5 % IV SOLN
INTRAVENOUS | Status: AC
  Filled 2020-06-30: qty 250

## 2020-06-30 MED ORDER — LACTATED RINGERS IV BOLUS: 1000.0000 mL | Freq: Once | INTRAVENOUS | Status: DC

## 2020-06-30 SURGICAL SUPPLY — 46 items
APL SKNCLS STERI-STRIP NONHPOA (GAUZE/BANDAGES/DRESSINGS) ×2
BENZOIN TINCTURE PRP APPL 2/3 (GAUZE/BANDAGES/DRESSINGS) ×3 IMPLANT
CANISTER SUCT 3000ML PPV (MISCELLANEOUS) ×3 IMPLANT
CHLORAPREP W/TINT 26ML (MISCELLANEOUS) ×3 IMPLANT
DRSG OPSITE POSTOP 4X10 (GAUZE/BANDAGES/DRESSINGS) ×3 IMPLANT
ELECT REM PT RETURN 9FT ADLT (ELECTROSURGICAL) ×3
ELECTRODE REM PT RTRN 9FT ADLT (ELECTROSURGICAL) ×2 IMPLANT
EXTRACTOR VACUUM KIWI (MISCELLANEOUS) ×3 IMPLANT
GAUZE SPONGE 4X4 12PLY STRL LF (GAUZE/BANDAGES/DRESSINGS) ×2 IMPLANT
GLOVE BIOGEL PI IND STRL 7.0 (GLOVE) ×4 IMPLANT
GLOVE BIOGEL PI IND STRL 7.5 (GLOVE) ×2 IMPLANT
GLOVE BIOGEL PI INDICATOR 7.0 (GLOVE) ×2
GLOVE BIOGEL PI INDICATOR 7.5 (GLOVE) ×1
GLOVE SKINSENSE NS SZ7.0 (GLOVE) ×1
GLOVE SKINSENSE STRL SZ7.0 (GLOVE) ×2 IMPLANT
GOWN STRL REUS W/ TWL LRG LVL3 (GOWN DISPOSABLE) ×4 IMPLANT
GOWN STRL REUS W/ TWL XL LVL3 (GOWN DISPOSABLE) ×2 IMPLANT
GOWN STRL REUS W/TWL LRG LVL3 (GOWN DISPOSABLE) ×6
GOWN STRL REUS W/TWL XL LVL3 (GOWN DISPOSABLE) ×3
HEMOSTAT ARISTA ABSORB 3G PWDR (HEMOSTASIS) ×2 IMPLANT
NS IRRIG 1000ML POUR BTL (IV SOLUTION) ×3 IMPLANT
PACK C SECTION WH (CUSTOM PROCEDURE TRAY) ×3 IMPLANT
PAD ABD 7.5X8 STRL (GAUZE/BANDAGES/DRESSINGS) ×3 IMPLANT
PAD OB MATERNITY 4.3X12.25 (PERSONAL CARE ITEMS) ×3 IMPLANT
PAD PREP 24X48 CUFFED NSTRL (MISCELLANEOUS) ×3 IMPLANT
PENCIL SMOKE EVAC W/HOLSTER (ELECTROSURGICAL) ×3 IMPLANT
SPONGE LAP 18X18 X RAY DECT (DISPOSABLE) ×1 IMPLANT
STAPLER VISISTAT 35W (STAPLE) ×1 IMPLANT
STRIP CLOSURE SKIN 1/2X4 (GAUZE/BANDAGES/DRESSINGS) ×3 IMPLANT
SUT CHROMIC 1 CTX 36 (SUTURE) ×6 IMPLANT
SUT MNCRL 0 VIOLET CTX 36 (SUTURE) ×4 IMPLANT
SUT MON AB 3-0 SH 27 (SUTURE) ×6
SUT MON AB 3-0 SH27 (SUTURE) IMPLANT
SUT MON AB 4-0 PS1 27 (SUTURE) ×3 IMPLANT
SUT MON AB-0 CT1 36 (SUTURE) ×1 IMPLANT
SUT MONOCRYL 0 CTX 36 (SUTURE) ×27
SUT PLAIN 2 0 XLH (SUTURE) ×4 IMPLANT
SUT VIC AB 0 CT1 18XCR BRD8 (SUTURE) IMPLANT
SUT VIC AB 0 CT1 36 (SUTURE) ×6 IMPLANT
SUT VIC AB 0 CT1 8-18 (SUTURE) ×9
SUT VIC AB 3-0 CT1 27 (SUTURE) ×3
SUT VIC AB 3-0 CT1 TAPERPNT 27 (SUTURE) ×2 IMPLANT
SUT VIC AB 3-0 SH 27 (SUTURE) ×3
SUT VIC AB 3-0 SH 27X BRD (SUTURE) IMPLANT
TOWEL OR 17X24 6PK STRL BLUE (TOWEL DISPOSABLE) ×6 IMPLANT
WATER STERILE IRR 1000ML POUR (IV SOLUTION) ×3 IMPLANT

## 2020-06-30 NOTE — Progress Notes (Signed)
LABOR PROGRESS NOTE  Terri Bradford is a 39 y.o. [id number] at [redacted]w[redacted]d  admitted for IOL for cHTN and history of IUFD.  Subjective: Patient is doing well. Getting some rest with PCA.   Objective: BP (!) 181/86 Comment: pt agitated due to pain  Pulse 78   Temp 98.3 F (36.8 C) (Oral)   Resp 20   Ht 5\' 2"  (1.575 m)   Wt 74.8 kg   LMP 10/13/2019   SpO2 98%   BMI 30.18 kg/m  or  Vitals:   06/30/20 1431 06/30/20 1501 06/30/20 1531 06/30/20 1602  BP: (!) 157/89 127/90 (!) 148/80 (!) 181/86  Pulse: 76 81 78 78  Resp: 18 20 16 20   Temp:    98.3 F (36.8 C)  TempSrc:    Oral  SpO2: 99% 95% 99% 98%  Weight:      Height:       Pitocin currently at 6 milli-unit/min  Dilation: 7 Effacement (%): 70 Cervical Position: Posterior Station: -2 Presentation: Vertex Exam by:: Dr FHT: baseline rate 120, moderate varibility, +accel, variable decles that improve with repositioning Toco: 2-3  Labs: Lab Results  Component Value Date   WBC 12.0 (H) 06/30/2020   HGB 11.9 (L) 06/30/2020   HCT 36.0 06/30/2020   MCV 103.2 (H) 06/30/2020   PLT 64 (L) 06/30/2020    Patient Active Problem List   Diagnosis Date Noted  . Thrombocytopenia ([facility]) 01/04/2020  . History of intrauterine fetal death in previous pregnancy 2020/01/06  . Chronic hypertension in pregnancy 2020-01-06  . History of cesarean section 01-06-2020  . AMA (advanced maternal age) multigravida 35+ 01-06-2020  . Supervision of high risk pregnancy, antepartum 12/26/2019  . Type 2 diabetes mellitus in pregnancy, second trimester 12/12/2014   Assessment / Plan: 39 y.o. 02/11/2017 at [redacted]w[redacted]d here for IOL   Labor: Progressing well on pitocin.Will continue to titrate pitocin until complete.  Pain Control:  PCA pump Anticipated MOD:  VBAC Hypertension: One severe range pressure, otherwise VSS.  T2DM: Last CBG 131, range from 76-164. Likely elevated 2/2 to pain and stress. Patient on CLD. Will continue to monitor.    [id number], MD 06/30/2020, 4:27 PM

## 2020-06-30 NOTE — Progress Notes (Signed)
Pt nausea and had very large emesis.  Pt also c/o feeling dizzy

## 2020-06-30 NOTE — Anesthesia Preprocedure Evaluation (Signed)
Anesthesia Evaluation  Patient identified by MRN, date of birth, ID band Patient awake    Reviewed: Allergy & Precautions, NPO status , Patient's Chart, lab work & pertinent test resultsPreop documentation limited or incomplete due to emergent nature of procedure.  Airway Mallampati: III  TM Distance: >3 FB Neck ROM: Full    Dental  (+) Dental Advisory Given,    Pulmonary Current Smoker,    Pulmonary exam normal breath sounds clear to auscultation       Cardiovascular hypertension, Pt. on medications and Pt. on home beta blockers Normal cardiovascular exam Rhythm:Regular Rate:Normal     Neuro/Psych PSYCHIATRIC DISORDERS Anxiety negative neurological ROS     GI/Hepatic negative GI ROS, Neg liver ROS,   Endo/Other  diabetes, Oral Hypoglycemic Agents  Renal/GU negative Renal ROS  negative genitourinary   Musculoskeletal negative musculoskeletal ROS (+)   Abdominal Normal abdominal exam  (+)   Peds negative pediatric ROS (+)  Hematology  (+) Blood dyscrasia, , Thrombocytopenia of pregnancy, baseline in 90s. plt 76 overnight, down to 64 today. Labs rechecked but not resulted prior to stat csection    Anesthesia Other Findings   Reproductive/Obstetrics (+) Pregnancy TOLAC Has had severe range Bps throughout this evening, was started on magnesium when multiple decelerations occurred and stat section was called. Labs recently drawn but not yet resulted.                             Anesthesia Physical Anesthesia Plan  ASA: III and emergent  Anesthesia Plan: General   Post-op Pain Management:    Induction: Intravenous, Rapid sequence and Cricoid pressure planned  PONV Risk Score and Plan: 3 and Ondansetron, Dexamethasone, Treatment may vary due to age or medical condition and Midazolam  Airway Management Planned: Oral ETT  Additional Equipment: None  Intra-op Plan:   Post-operative  Plan: Extubation in OR  Informed Consent: I have reviewed the patients History and Physical, chart, labs and discussed the procedure including the risks, benefits and alternatives for the proposed anesthesia with the patient or authorized representative who has indicated his/her understanding and acceptance.     Dental advisory given and Only emergency history available  Plan Discussed with: CRNA  Anesthesia Plan Comments:         Anesthesia Quick Evaluation

## 2020-06-30 NOTE — Progress Notes (Signed)
Informed patient that her platelet count has dropped to 64 and that 64 is too low to have an epidural placed.  Talked with pt concerning a PCA pump being an option to control her pain.  Called provider and requested that they come and talk with patient concerning her options for pain control.  Pt unhappy that she is not able to have an epidural placed

## 2020-06-30 NOTE — Progress Notes (Signed)
Attempted to apply f%m monitor.  Monitor placement not successful

## 2020-06-30 NOTE — Progress Notes (Signed)
Patient moving around (wiggling) frequently due to pain and has an anterior placenta.  Difficult to have continuous fhr and uc tracing

## 2020-06-30 NOTE — Progress Notes (Signed)
Attempted to place IFSE  Infant's hand presenting with vertex and infant bollotable unable to place IFSE

## 2020-06-30 NOTE — Progress Notes (Signed)
Terri Bradford is a 39 y.o. [id number] at [redacted]w[redacted]d by LMP admitted for induction of labor due to Hypertension and T2 DM.  Subjective: Patient sleeping in between contractions, but not able to tolerate pain Objective: BP (!) 164/93   Pulse 89   Temp 98.3 F (36.8 C) (Oral)   Resp 20   Ht 5\' 2"  (1.575 m)   Wt 74.8 kg   LMP 10/13/2019   SpO2 100%   BMI 30.18 kg/m  I/O last 3 completed shifts: In: -  Out: 650 [Urine:650] No intake/output data recorded.  FHT:  FHR: 115 bpm, variability: moderate,  accelerations:  Abscent,  decelerations:  Present varibables with UC's UC:   regular, every 2-4 minutes MVU = 220  SVE:   Dilation: 5 Effacement (%): 60 Station: -3 / vertex with a compound hand Exam by: 10/15/2019, CNM  Labs: Lab Results  Component Value Date   WBC 12.0 (H) 06/30/2020   HGB 11.9 (L) 06/30/2020   HCT 36.0 06/30/2020   MCV 103.2 (H) 06/30/2020   PLT 64 (L) 06/30/2020    Assessment / Plan: Induction of labor due to cHTN & T2DM,  progressing well on pitocin  Labor: Protracted labor on Pitocin with adequate MVUs x 3.5 hours Preeclampsia:  labs pending, MgSO4 infusing Fetal Wellbeing:  Category II Pain Control:  IV pain meds I/D:  n/a Anticipated MOD:  Discussed with patient no progress in cervical dilation in 3.5 hours with adequate contractions on pitocin. Recommend cesarean delivery for failure to progress in the setting of PEC with severe features. Patient verbalized an understanding of the plan of care and agrees. Dr. 07/02/2020 called and on the way to speak to patient about cesarean delivery. Pitocin off and prep for surgery initiated.   Vergie Living, CNM 06/30/2020, 7:09 PM

## 2020-06-30 NOTE — Progress Notes (Signed)
Pt sitting straight up due to uc's and back pain

## 2020-06-30 NOTE — Anesthesia Procedure Notes (Signed)
Procedure Name: Intubation Date/Time: 06/30/2020 7:35 PM Performed by: Pervis Hocking, DO Pre-anesthesia Checklist: Patient identified, Emergency Drugs available, Suction available and Patient being monitored Patient Re-evaluated:Patient Re-evaluated prior to induction Oxygen Delivery Method: Circle system utilized Preoxygenation: Pre-oxygenation with 100% oxygen Induction Type: IV induction Laryngoscope Size: Mac, Glidescope and 3 Grade View: Grade III Tube type: Oral Tube size: 7.0 mm Number of attempts: 2 Airway Equipment and Method: Stylet and Oral airway Placement Confirmation: ETT inserted through vocal cords under direct vision,  positive ETCO2 and breath sounds checked- equal and bilateral Secured at: 22 cm Tube secured with: Tape Dental Injury: Teeth and Oropharynx as per pre-operative assessment  Comments: Anterior airway, even with glidescope

## 2020-06-30 NOTE — Progress Notes (Addendum)
LABOR PROGRESS NOTE  Terri Bradford is a 39 y.o. [id number] at [redacted]w[redacted]d  admitted for IOL  Subjective: Patients pain somewhat more controlled with PCA. Continues to feel frequent contractions and pressure. Otherwise doing well.   Objective: BP (!) 159/86   Pulse 66   Temp 98.1 F (36.7 C) (Oral)   Resp 20   Ht 5\' 2"  (1.575 m)   Wt 74.8 kg   LMP 10/13/2019   SpO2 99%   BMI 30.18 kg/m  or  Vitals:   06/30/20 1031 06/30/20 1100 06/30/20 1131 06/30/20 1200  BP: 135/77 118/78 (!) 160/102 (!) 159/86  Pulse: 78 68 99 66  Resp: 20  16 20   Temp:    98.1 F (36.7 C)  TempSrc:    Oral  SpO2: 98% 100% 100% 99%  Weight:      Height:        Pitocin currently at 6 milli-unit/min  Dilation: 6 Effacement (%): 70 Cervical Position: Posterior Station: -3 Presentation: Vertex Exam by:: Dr FHT: baseline rate 115, moderate varibility, +accel, variable decles that improve with repositioning Toco: 2-3  Labs: Lab Results  Component Value Date   WBC 12.0 (H) 06/30/2020   HGB 11.9 (L) 06/30/2020   HCT 36.0 06/30/2020   MCV 103.2 (H) 06/30/2020   PLT 64 (L) 06/30/2020    Patient Active Problem List   Diagnosis Date Noted  . Thrombocytopenia ([facility]) 01/04/2020  . History of intrauterine fetal death in previous pregnancy 01-11-2020  . Chronic hypertension in pregnancy Jan 11, 2020  . History of cesarean section January 11, 2020  . AMA (advanced maternal age) multigravida 35+ 01/11/20  . Supervision of high risk pregnancy, antepartum 12/26/2019  . Type 2 diabetes mellitus in pregnancy, second trimester 12/12/2014   Assessment / Plan: 39 y.o. 02/11/2017 at [redacted]w[redacted]d here for IOL   Labor: Progressing well on pitocin. AROM @ 1135. Palpated digits on SVE. Bedside ultrasound confirmed cephalic presentation. Will continue to titrate pitocin until complete.  Pain Control:  PCA pump Anticipated MOD:  VBAC Hypertension: continue to monitor. All mid range BP since last progress note.  T2DM: Last CBG 91,  range from 84-121. Will continue to monitor.    [id number], MD 06/30/2020, 12:05 PM  Patient informed that the ultrasound is considered a limited OB ultrasound and is not intended to be a complete ultrasound exam.  Patient also informed that the ultrasound is not being completed with the intent of assessing for fetal or placental anomalies or any pelvic abnormalities.  Explained that the purpose of today's ultrasound is to assess for presentation since possible fingers or toes were palpable on exam.  Baby was found to be in a vertex presentation with a compound hand. Patient acknowledges the purpose of the exam and the limitations of the study.  Terri Bradford, CNM  06/30/2020 12:05 PM

## 2020-06-30 NOTE — Anesthesia Procedure Notes (Deleted)
Procedure Name: Intubation Date/Time: 06/30/2020 7:34 PM Performed by: Asher Muir, CRNA Pre-anesthesia Checklist: Patient identified, Emergency Drugs available, Suction available and Patient being monitored Patient Re-evaluated:Patient Re-evaluated prior to induction Oxygen Delivery Method: Circle system utilized Preoxygenation: Pre-oxygenation with 100% oxygen Induction Type: IV induction, Cricoid Pressure applied and Rapid sequence Laryngoscope Size: Mac, 3 and Glidescope (video laryngoscopy) Grade View: Grade II Tube type: Oral Tube size: 7.0 mm Number of attempts: 1 Airway Equipment and Method: Video-laryngoscopy and Stylet Placement Confirmation: ETT inserted through vocal cords under direct vision,  positive ETCO2 and breath sounds checked- equal and bilateral Secured at: 20 cm Tube secured with: Tape Dental Injury: Teeth and Oropharynx as per pre-operative assessment

## 2020-06-30 NOTE — Progress Notes (Signed)
LABOR PROGRESS NOTE  Terri Bradford is a 39 y.o. [id number] at [redacted]w[redacted]d  admitted for IOL  Subjective: Patient reports a lot of pain and discomfort with contractions. Unfortunately was not able to get epidural due to platelet count. Has PCA pump now. Otherwise no complaints or concerns.   Objective: BP (!) 142/97   Pulse 84   Temp 98.2 F (36.8 C) (Oral)   Resp 16   Ht 5\' 2"  (1.575 m)   Wt 74.8 kg   LMP 10/13/2019   SpO2 100%   BMI 30.18 kg/m  or  Vitals:   06/30/20 0940 06/30/20 0945 06/30/20 0951 06/30/20 1001  BP:   (!) 145/82 (!) 142/97  Pulse:   82 84  Resp:   20 16  Temp:      TempSrc:      SpO2: 100% 100% 100% 100%  Weight:      Height:        Pitocin currently at 6 milli-unit/min  Dilation: 6 Effacement (%): 70 Cervical Position: Posterior Station: -3 Presentation: Vertex Exam by:: Dr [email or web address] FHT: baseline rate 120, moderate varibility, +accel, no decel Toco: 2-3  Labs: Lab Results  Component Value Date   WBC 12.0 (H) 06/30/2020   HGB 11.9 (L) 06/30/2020   HCT 36.0 06/30/2020   MCV 103.2 (H) 06/30/2020   PLT 64 (L) 06/30/2020    Patient Active Problem List   Diagnosis Date Noted  . Thrombocytopenia ([facility]) 01/04/2020  . History of intrauterine fetal death in previous pregnancy January 23, 2020  . Chronic hypertension in pregnancy January 23, 2020  . History of cesarean section 01/23/2020  . AMA (advanced maternal age) multigravida 35+ Jan 23, 2020  . Supervision of high risk pregnancy, antepartum 12/26/2019  . Type 2 diabetes mellitus in pregnancy, second trimester 12/12/2014    Assessment / Plan: 39 y.o. 46 at [redacted]w[redacted]d here for IOL   Labor: Progressing well on pitocin, continue to titrate pitocin to active labor. Plan to AROM once pain is under control with PCA pump. Fetal Wellbeing:  Cat I  Pain Control:  PCA pump Anticipated MOD:  VBAC Hypertension: continue to monitor. All mid range BP since last progress note.  T2DM: Last CBG 91, range from 84-121. Will  continue to monitor.    [redacted]w[redacted]d, MD 06/30/2020, 10:09 AM

## 2020-06-30 NOTE — Progress Notes (Signed)
Lanice Shirts CNM notified of elevated BP's and that Labetalol 200 mg po given.  Instructed to take by in 30 minutes and to notify provider if BP still elevated

## 2020-06-30 NOTE — Progress Notes (Signed)
Terri Bradford is a 39 y.o. [id number] at [redacted]w[redacted]d by LMP admitted for induction of labor due to Diabetes and Hypertension.  Subjective: Dr. Vergie Living at bedside @ 1910 to discuss recommendation for cesarean delivery and the risks involved with surgery. Patient not tolerant of pain. Unable to trace FHR adequately due to frequent position changes with patient. Terbutaline given @ 1912.  Objective: BP (!) 164/93   Pulse 89   Temp 98.3 F (36.8 C) (Oral)   Resp 20   Ht 5\' 2"  (1.575 m)   Wt 74.8 kg   LMP 10/13/2019   SpO2 100%   BMI 30.18 kg/m  I/O last 3 completed shifts: In: -  Out: 650 [Urine:650] Total I/O In: 50 [IV Piggyback:50] Out: -   FHT:  FHR: 110 bpm, variability: moderate,  accelerations:  Abscent,  decelerations:  Present FHR down to 90s (prolonged) UC:   regular, every 2 minutes SVE:   Dilation: 5 Effacement (%): 60, cervical swelling noted Station: -3 / vertex with compound hand Exam by: 10/15/2019, CNM  Labs: Lab Results  Component Value Date   WBC 23.6 (H) 06/30/2020   HGB 12.6 06/30/2020   HCT 37.4 06/30/2020   MCV 101.9 (H) 06/30/2020   PLT PENDING 06/30/2020    Assessment / Plan: Induction of labor due to cHTN & T2DM,  progressing well on pitocin  Labor: Protracted labor on Pitocin Preeclampsia:  on magnesium sulfate  Labs pending Fetal Wellbeing:  Category II Pain Control:  IV pain meds I/D:  n/a Anticipated MOD:  RCS - Stat due to prolonged decel in FHR despite interventions  07/02/2020, CNM 06/30/2020, 7:25 PM

## 2020-06-30 NOTE — Progress Notes (Signed)
Attempted a second time to place IFSE unable to place

## 2020-06-30 NOTE — Progress Notes (Signed)
Telemetry monitor battery at 0% trading telemetry monitors

## 2020-06-30 NOTE — Progress Notes (Signed)
Inform V Rogers that patient's platelet count is 64,  BP's now out of severe range.  Requested she come to room and discuss with patient why she is not able to have epidural placed.

## 2020-06-30 NOTE — Progress Notes (Signed)
Late entry due to patient care.   Patient breathing through contractions. Reports contractions occurring every 2 minutes. Pitocin on 12milli-unit/min at this time.   FB pulled and out @0330 . Continue to titrate pitocin over 6 at this time.  Cat I tracing  BP stable  Plans for VBAC   Patient requesting pain medication at this time - Nubain IV ordered.   , CNM 06/30/20, 5:46 AM

## 2020-06-30 NOTE — Progress Notes (Signed)
LABOR PROGRESS NOTE  Yamile CHARLY HUNTON is a 39 y.o. [phone] at [redacted]w[redacted]d  admitted for IOL  Subjective: Patient doing well, breathing through contractions, patient reports that she recently woke up from nap   Objective: BP 132/87   Pulse 64   Temp 98.4 F (36.9 C) (Oral)   Resp 16   Ht 5\' 2"  (1.575 m)   Wt 74.8 kg   LMP 10/13/2019   SpO2 100%   BMI 30.18 kg/m  or  Vitals:   06/30/20 0128 06/30/20 0318 06/30/20 0501 06/30/20 0531  BP: (!) 149/84 (!) 149/94 (!) 161/89 132/87  Pulse: 74 76 72 64  Resp: 16 16    Temp:      TempSrc:      SpO2:      Weight:      Height:        Pitocin currently at 6 milli-unit/min  Dilation: 6 Effacement (%): 70 Cervical Position: Posterior Station: -3 Presentation: Vertex Exam by:: [email or web address], CNM FHT: baseline rate 130, moderate varibility, +accel, no decel Toco: 2-3  Labs: Lab Results  Component Value Date   WBC 7.5 06/29/2020   HGB 11.5 (L) 06/29/2020   HCT 34.3 (L) 06/29/2020   MCV 102.1 (H) 06/29/2020   PLT 76 (L) 06/29/2020    Patient Active Problem List   Diagnosis Date Noted  . Thrombocytopenia (HCC) 01/04/2020  . History of intrauterine fetal death in previous pregnancy 2020/01/18  . Chronic hypertension in pregnancy 01-18-2020  . History of cesarean section 01-18-20  . AMA (advanced maternal age) multigravida 35+ 2020-01-18  . Supervision of high risk pregnancy, antepartum 12/26/2019  . Type 2 diabetes mellitus in pregnancy, second trimester 12/12/2014    Assessment / Plan: 39 y.o. G4P3002 at [redacted]w[redacted]d here for IOL   Labor: Progressing well on pitocin, continue to titrate pitocin to active labor, plan to AROM at next cervical examination once station -2 or -1.  Fetal Wellbeing:  Cat I  Pain Control:  IV pain medication  Anticipated MOD:  VBAC Hypertension: continue to monitor, one severe range at 0501 then normal after- labetalol BID  T2DM: CBG 121, no CBG since 2313, discussed with RN to take CBG now.   2314, CNM 06/30/2020, 5:39 AM

## 2020-07-01 ENCOUNTER — Encounter (HOSPITAL_COMMUNITY): Payer: Self-pay | Admitting: Radiology

## 2020-07-01 DIAGNOSIS — Z3A37 37 weeks gestation of pregnancy: Secondary | ICD-10-CM

## 2020-07-01 DIAGNOSIS — N994 Postprocedural pelvic peritoneal adhesions: Secondary | ICD-10-CM

## 2020-07-01 DIAGNOSIS — O1414 Severe pre-eclampsia complicating childbirth: Secondary | ICD-10-CM

## 2020-07-01 DIAGNOSIS — E119 Type 2 diabetes mellitus without complications: Secondary | ICD-10-CM

## 2020-07-01 DIAGNOSIS — O34211 Maternal care for low transverse scar from previous cesarean delivery: Secondary | ICD-10-CM

## 2020-07-01 DIAGNOSIS — O2412 Pre-existing diabetes mellitus, type 2, in childbirth: Secondary | ICD-10-CM

## 2020-07-01 DIAGNOSIS — Z7984 Long term (current) use of oral hypoglycemic drugs: Secondary | ICD-10-CM

## 2020-07-01 DIAGNOSIS — O114 Pre-existing hypertension with pre-eclampsia, complicating childbirth: Secondary | ICD-10-CM

## 2020-07-01 LAB — COMPREHENSIVE METABOLIC PANEL
ALT: 11 U/L (ref 0–44)
ALT: 11 U/L (ref 0–44)
ALT: 13 U/L (ref 0–44)
AST: 19 U/L (ref 15–41)
AST: 24 U/L (ref 15–41)
AST: 25 U/L (ref 15–41)
Albumin: 2 g/dL — ABNORMAL LOW (ref 3.5–5.0)
Albumin: 2 g/dL — ABNORMAL LOW (ref 3.5–5.0)
Albumin: 1.8 g/dL — ABNORMAL LOW (ref 3.5–5.0)
Alkaline Phosphatase: 45 U/L (ref 38–126)
Alkaline Phosphatase: 53 U/L (ref 38–126)
Alkaline Phosphatase: 43 U/L (ref 38–126)
Anion gap: 12 (ref 5–15)
Anion gap: 10 (ref 5–15)
Anion gap: 19 — ABNORMAL HIGH (ref 5–15)
BUN: 6 mg/dL (ref 6–20)
BUN: 6 mg/dL (ref 6–20)
BUN: 7 mg/dL (ref 6–20)
CO2: 21 mmol/L — ABNORMAL LOW (ref 22–32)
CO2: 21 mmol/L — ABNORMAL LOW (ref 22–32)
CO2: 20 mmol/L — ABNORMAL LOW (ref 22–32)
Calcium: 8.4 mg/dL — ABNORMAL LOW (ref 8.9–10.3)
Calcium: 8.9 mg/dL (ref 8.9–10.3)
Calcium: 7.2 mg/dL — ABNORMAL LOW (ref 8.9–10.3)
Chloride: 105 mmol/L (ref 98–111)
Chloride: 105 mmol/L (ref 98–111)
Chloride: 97 mmol/L — ABNORMAL LOW (ref 98–111)
Creatinine, Ser: 0.62 mg/dL (ref 0.44–1.00)
Creatinine, Ser: 0.66 mg/dL (ref 0.44–1.00)
Creatinine, Ser: 0.79 mg/dL (ref 0.44–1.00)
GFR, Estimated: >60 mL/min (ref >60)
GFR, Estimated: >60 mL/min (ref >60)
GFR, Estimated: >60 mL/min (ref >60)
Glucose, Bld: 182 mg/dL — ABNORMAL HIGH (ref 70–99)
Glucose, Bld: 176 mg/dL — ABNORMAL HIGH (ref 70–99)
Glucose, Bld: 161 mg/dL — ABNORMAL HIGH (ref 70–99)
Potassium: 3.9 mmol/L (ref 3.5–5.1)
Potassium: 4.1 mmol/L (ref 3.5–5.1)
Potassium: 3.7 mmol/L (ref 3.5–5.1)
Sodium: 138 mmol/L (ref 135–145)
Sodium: 136 mmol/L (ref 135–145)
Sodium: 136 mmol/L (ref 135–145)
Total Bilirubin: 2.1 mg/dL — ABNORMAL HIGH (ref 0.3–1.2)
Total Bilirubin: 3.4 mg/dL — ABNORMAL HIGH (ref 0.3–1.2)
Total Bilirubin: 1.8 mg/dL — ABNORMAL HIGH (ref 0.3–1.2)
Total Protein: 3.8 g/dL — ABNORMAL LOW (ref 6.5–8.1)
Total Protein: 3.8 g/dL — ABNORMAL LOW (ref 6.5–8.1)
Total Protein: 3.6 g/dL — ABNORMAL LOW (ref 6.5–8.1)

## 2020-07-01 LAB — PREPARE PLATELET PHERESIS
Unit division: 0
Unit division: 0
Unit division: 0
Unit division: 0
Unit division: 0

## 2020-07-01 LAB — POCT I-STAT 7, (LYTES, BLD GAS, ICA,H+H)
Acid-base deficit: 1 mmol/L (ref 0.0–2.0)
Acid-base deficit: 3 mmol/L — ABNORMAL HIGH (ref 0.0–2.0)
Acid-base deficit: 3 mmol/L — ABNORMAL HIGH (ref 0.0–2.0)
Bicarbonate: 22.6 mmol/L (ref 20.0–28.0)
Bicarbonate: 22.8 mmol/L (ref 20.0–28.0)
Bicarbonate: 24.2 mmol/L (ref 20.0–28.0)
Calcium, Ion: 0.84 mmol/L — CL (ref 1.15–1.40)
Calcium, Ion: 1.03 mmol/L — ABNORMAL LOW (ref 1.15–1.40)
Calcium, Ion: 1.46 mmol/L — ABNORMAL HIGH (ref 1.15–1.40)
HCT: 19 % — ABNORMAL LOW (ref 36.0–46.0)
HCT: 24 % — ABNORMAL LOW (ref 36.0–46.0)
HCT: 25 % — ABNORMAL LOW (ref 36.0–46.0)
Hemoglobin: 6.5 g/dL — CL (ref 12.0–15.0)
Hemoglobin: 8.2 g/dL — ABNORMAL LOW (ref 12.0–15.0)
Hemoglobin: 8.5 g/dL — ABNORMAL LOW (ref 12.0–15.0)
O2 Saturation: 100 %
O2 Saturation: 100 %
O2 Saturation: 100 %
Patient temperature: 34.2
Patient temperature: 35
Potassium: 3.7 mmol/L (ref 3.5–5.1)
Potassium: 3.9 mmol/L (ref 3.5–5.1)
Potassium: 3.9 mmol/L (ref 3.5–5.1)
Sodium: 139 mmol/L (ref 135–145)
Sodium: 139 mmol/L (ref 135–145)
Sodium: 140 mmol/L (ref 135–145)
TCO2: 24 mmol/L (ref 22–32)
TCO2: 24 mmol/L (ref 22–32)
TCO2: 25 mmol/L (ref 22–32)
pCO2 arterial: 37 mmHg (ref 32.0–48.0)
pCO2 arterial: 41.6 mmHg (ref 32.0–48.0)
pCO2 arterial: 43.4 mmHg (ref 32.0–48.0)
pH, Arterial: 7.327 — ABNORMAL LOW (ref 7.350–7.450)
pH, Arterial: 7.333 — ABNORMAL LOW (ref 7.350–7.450)
pH, Arterial: 7.41 (ref 7.350–7.450)
pO2, Arterial: 212 mmHg — ABNORMAL HIGH (ref 83.0–108.0)
pO2, Arterial: 222 mmHg — ABNORMAL HIGH (ref 83.0–108.0)
pO2, Arterial: 229 mmHg — ABNORMAL HIGH (ref 83.0–108.0)

## 2020-07-01 LAB — GLUCOSE, CAPILLARY
Glucose-Capillary: 163 mg/dL — ABNORMAL HIGH (ref 70–99)
Glucose-Capillary: 159 mg/dL — ABNORMAL HIGH (ref 70–99)
Glucose-Capillary: 145 mg/dL — ABNORMAL HIGH (ref 70–99)

## 2020-07-01 LAB — BPAM PLATELET PHERESIS
Blood Product Expiration Date: 202111292359
Blood Product Expiration Date: 202111292359
Blood Product Expiration Date: 202112012359
Blood Product Expiration Date: 202112012359
Blood Product Expiration Date: 202112012359
ISSUE DATE / TIME: 202111291146
ISSUE DATE / TIME: 202111291500
ISSUE DATE / TIME: 202111292038
ISSUE DATE / TIME: 202111292148
ISSUE DATE / TIME: 202111292230
Unit Type and Rh: 5100
Unit Type and Rh: 6200
Unit Type and Rh: 6200
Unit Type and Rh: 6200
Unit Type and Rh: 6200

## 2020-07-01 LAB — BPAM CRYOPRECIPITATE
Blood Product Expiration Date: 202111300342
Blood Product Expiration Date: 202111300423
ISSUE DATE / TIME: 202111292211
ISSUE DATE / TIME: 202111292240
Unit Type and Rh: 5100
Unit Type and Rh: 5100

## 2020-07-01 LAB — PREPARE CRYOPRECIPITATE
Unit division: 0
Unit division: 0

## 2020-07-01 LAB — CBC
HCT: 27.9 % — ABNORMAL LOW (ref 36.0–46.0)
HCT: 22.3 % — ABNORMAL LOW (ref 36.0–46.0)
HCT: 24.8 % — ABNORMAL LOW (ref 36.0–46.0)
Hemoglobin: 9.5 g/dL — ABNORMAL LOW (ref 12.0–15.0)
Hemoglobin: 7.5 g/dL — ABNORMAL LOW (ref 12.0–15.0)
Hemoglobin: 8.6 g/dL — ABNORMAL LOW (ref 12.0–15.0)
MCH: 30.6 pg (ref 26.0–34.0)
MCH: 30.7 pg (ref 26.0–34.0)
MCH: 31.5 pg (ref 26.0–34.0)
MCHC: 34.1 g/dL (ref 30.0–36.0)
MCHC: 33.6 g/dL (ref 30.0–36.0)
MCHC: 34.7 g/dL (ref 30.0–36.0)
MCV: 90 fL (ref 80.0–100.0)
MCV: 91.4 fL (ref 80.0–100.0)
MCV: 90.8 fL (ref 80.0–100.0)
Platelets: 97 10*3/uL — ABNORMAL LOW (ref 150–400)
Platelets: 80 10*3/uL — ABNORMAL LOW (ref 150–400)
Platelets: UNDETERMINED 10*3/uL (ref 150–400)
RBC: 3.1 MIL/uL — ABNORMAL LOW (ref 3.87–5.11)
RBC: 2.44 MIL/uL — ABNORMAL LOW (ref 3.87–5.11)
RBC: 2.73 MIL/uL — ABNORMAL LOW (ref 3.87–5.11)
RDW: 16 % — ABNORMAL HIGH (ref 11.5–15.5)
RDW: 17.1 % — ABNORMAL HIGH (ref 11.5–15.5)
RDW: 17.7 % — ABNORMAL HIGH (ref 11.5–15.5)
WBC: 14.9 10*3/uL — ABNORMAL HIGH (ref 4.0–10.5)
WBC: 15.7 10*3/uL — ABNORMAL HIGH (ref 4.0–10.5)
WBC: 18.3 10*3/uL — ABNORMAL HIGH (ref 4.0–10.5)
nRBC: 0 % (ref 0.0–0.2)
nRBC: 0 % (ref 0.0–0.2)
nRBC: 0.2 % (ref 0.0–0.2)

## 2020-07-01 LAB — BPAM FFP
Blood Product Expiration Date: 202112042359
Blood Product Expiration Date: 202112042359
Blood Product Expiration Date: 202112042359
ISSUE DATE / TIME: 202111292127
ISSUE DATE / TIME: 202111292241
ISSUE DATE / TIME: 202111300301
Unit Type and Rh: 7300
Unit Type and Rh: 7300
Unit Type and Rh: 7300

## 2020-07-01 LAB — PREPARE FRESH FROZEN PLASMA
Unit division: 0
Unit division: 0
Unit division: 0

## 2020-07-01 LAB — DIC (DISSEMINATED INTRAVASCULAR COAGULATION)PANEL
D-Dimer, Quant: 18.26 ug/mL-FEU — ABNORMAL HIGH (ref 0.00–0.50)
Fibrinogen: 235 mg/dL (ref 210–475)
INR: 1.4 — ABNORMAL HIGH (ref 0.8–1.2)
Platelets: 73 10*3/uL — ABNORMAL LOW (ref 150–400)
Prothrombin Time: 16.9 seconds — ABNORMAL HIGH (ref 11.4–15.2)
Smear Review: NONE SEEN
aPTT: 37 seconds — ABNORMAL HIGH (ref 24–36)

## 2020-07-01 LAB — PREPARE RBC (CROSSMATCH)

## 2020-07-01 LAB — SAVE SMEAR(SSMR), FOR PROVIDER SLIDE REVIEW

## 2020-07-01 LAB — FIBRINOGEN: Fibrinogen: 238 mg/dL (ref 210–475)

## 2020-07-01 LAB — MAGNESIUM: Magnesium: 5.7 mg/dL — ABNORMAL HIGH (ref 1.7–2.4)

## 2020-07-01 LAB — APTT: aPTT: 32 seconds (ref 24–36)

## 2020-07-01 LAB — PROTIME-INR
INR: 1.4 — ABNORMAL HIGH (ref 0.8–1.2)
INR: >10 (ref 0.8–1.2)
Prothrombin Time: 16.1 seconds — ABNORMAL HIGH (ref 11.4–15.2)
Prothrombin Time: >90 seconds — ABNORMAL HIGH (ref 11.4–15.2)

## 2020-07-01 MED ORDER — KETOROLAC TROMETHAMINE 30 MG/ML IJ SOLN: 30.0000 mg | Freq: Once | INTRAMUSCULAR | Status: DC | PRN

## 2020-07-01 MED ORDER — FENTANYL CITRATE (PF) 100 MCG/2ML IJ SOLN
50.0000 ug | Freq: Once | INTRAMUSCULAR | Status: AC
  Administered 2020-07-01: 50 ug via INTRAVENOUS
  Filled 2020-07-01: qty 2

## 2020-07-01 MED ORDER — FUROSEMIDE 10 MG/ML IJ SOLN
20.0000 mg | Freq: Once | INTRAMUSCULAR | Status: AC
  Administered 2020-07-01: 20 mg via INTRAVENOUS
  Filled 2020-07-01: qty 2

## 2020-07-01 MED ORDER — PROMETHAZINE HCL 25 MG/ML IJ SOLN: 6.2500 mg | INTRAMUSCULAR | Status: DC | PRN

## 2020-07-01 MED ORDER — PRENATAL MULTIVITAMIN CH
1.0000 | ORAL_TABLET | Freq: Every day | ORAL | Status: DC
  Administered 2020-07-01 – 2020-07-04 (×4): 1 via ORAL
  Filled 2020-07-01 (×4): qty 1

## 2020-07-01 MED ORDER — SCOPOLAMINE 1 MG/3DAYS TD PT72
1.0000 | MEDICATED_PATCH | Freq: Once | TRANSDERMAL | Status: AC
  Administered 2020-07-01: 1.5 mg via TRANSDERMAL
  Filled 2020-07-01: qty 1

## 2020-07-01 MED ORDER — KETOROLAC TROMETHAMINE 30 MG/ML IJ SOLN: 30.0000 mg | Freq: Four times a day (QID) | INTRAMUSCULAR | Status: DC | PRN

## 2020-07-01 MED ORDER — SODIUM CHLORIDE 0.9% FLUSH: 3.0000 mL | INTRAVENOUS | Status: DC | PRN

## 2020-07-01 MED ORDER — MORPHINE SULFATE (PF) 2 MG/ML IV SOLN
2.0000 mg | INTRAVENOUS | Status: DC | PRN
  Administered 2020-07-01: 2 mg via INTRAVENOUS
  Filled 2020-07-01 (×2): qty 1

## 2020-07-01 MED ORDER — LACTATED RINGERS IV SOLN: INTRAVENOUS | Status: DC

## 2020-07-01 MED ORDER — NALBUPHINE HCL 10 MG/ML IJ SOLN: 5.0000 mg | Freq: Once | INTRAMUSCULAR | Status: DC | PRN

## 2020-07-01 MED ORDER — ACETAMINOPHEN 500 MG PO TABS
1000.0000 mg | ORAL_TABLET | Freq: Four times a day (QID) | ORAL | Status: AC
  Administered 2020-07-01 (×3): 1000 mg via ORAL
  Filled 2020-07-01 (×4): qty 2

## 2020-07-01 MED ORDER — MENTHOL 3 MG MT LOZG
1.0000 | LOZENGE | OROMUCOSAL | Status: DC | PRN
  Administered 2020-07-01: 3 mg via ORAL
  Filled 2020-07-01: qty 9

## 2020-07-01 MED ORDER — NALOXONE HCL 0.4 MG/ML IJ SOLN: 0.4000 mg | INTRAMUSCULAR | Status: DC | PRN

## 2020-07-01 MED ORDER — DIBUCAINE (PERIANAL) 1 % EX OINT: 1.0000 "application " | TOPICAL_OINTMENT | CUTANEOUS | Status: DC | PRN

## 2020-07-01 MED ORDER — SODIUM CHLORIDE 0.9% IV SOLUTION: Freq: Once | INTRAVENOUS | Status: DC

## 2020-07-01 MED ORDER — OXYCODONE HCL 5 MG/5ML PO SOLN: 5.0000 mg | Freq: Once | ORAL | Status: DC | PRN

## 2020-07-01 MED ORDER — DEXMEDETOMIDINE (PRECEDEX) IN NS 20 MCG/5ML (4 MCG/ML) IV SYRINGE
PREFILLED_SYRINGE | INTRAVENOUS | Status: AC
  Filled 2020-07-01: qty 5

## 2020-07-01 MED ORDER — ZOLPIDEM TARTRATE 5 MG PO TABS: 5.0000 mg | ORAL_TABLET | Freq: Every evening | ORAL | Status: DC | PRN

## 2020-07-01 MED ORDER — DIPHENHYDRAMINE HCL 25 MG PO CAPS: 25.0000 mg | ORAL_CAPSULE | Freq: Four times a day (QID) | ORAL | Status: DC | PRN

## 2020-07-01 MED ORDER — LACTATED RINGERS IV SOLN: INTRAVENOUS | Status: AC

## 2020-07-01 MED ORDER — ONDANSETRON HCL 4 MG/2ML IJ SOLN: 4.0000 mg | Freq: Three times a day (TID) | INTRAMUSCULAR | Status: DC | PRN

## 2020-07-01 MED ORDER — HYDROMORPHONE HCL 1 MG/ML IJ SOLN: 0.2500 mg | INTRAMUSCULAR | Status: DC | PRN

## 2020-07-01 MED ORDER — MEPERIDINE HCL 25 MG/ML IJ SOLN: 6.2500 mg | INTRAMUSCULAR | Status: DC | PRN

## 2020-07-01 MED ORDER — NALBUPHINE HCL 10 MG/ML IJ SOLN: 5.0000 mg | INTRAMUSCULAR | Status: DC | PRN

## 2020-07-01 MED ORDER — POLYETHYLENE GLYCOL 3350 17 G PO PACK
17.0000 g | PACK | Freq: Every day | ORAL | Status: DC
  Administered 2020-07-02 – 2020-07-03 (×2): 17 g via ORAL
  Filled 2020-07-01 (×2): qty 1

## 2020-07-01 MED ORDER — INSULIN ASPART 100 UNIT/ML ~~LOC~~ SOLN: 4.0000 [IU] | Freq: Three times a day (TID) | SUBCUTANEOUS | Status: DC

## 2020-07-01 MED ORDER — DIPHENHYDRAMINE HCL 50 MG/ML IJ SOLN: 12.5000 mg | INTRAMUSCULAR | Status: DC | PRN

## 2020-07-01 MED ORDER — ACETAMINOPHEN 10 MG/ML IV SOLN
INTRAVENOUS | Status: AC
  Filled 2020-07-01: qty 100

## 2020-07-01 MED ORDER — INSULIN ASPART 100 UNIT/ML ~~LOC~~ SOLN: 0.0000 [IU] | Freq: Three times a day (TID) | SUBCUTANEOUS | Status: DC

## 2020-07-01 MED ORDER — DIPHENHYDRAMINE HCL 25 MG PO CAPS: 25.0000 mg | ORAL_CAPSULE | ORAL | Status: DC | PRN

## 2020-07-01 MED ORDER — DEXMEDETOMIDINE (PRECEDEX) IN NS 20 MCG/5ML (4 MCG/ML) IV SYRINGE
PREFILLED_SYRINGE | INTRAVENOUS | Status: DC | PRN
  Administered 2020-07-01: 12 ug via INTRAVENOUS

## 2020-07-01 MED ORDER — SENNOSIDES-DOCUSATE SODIUM 8.6-50 MG PO TABS: 2.0000 | ORAL_TABLET | Freq: Every evening | ORAL | Status: DC | PRN

## 2020-07-01 MED ORDER — FENTANYL CITRATE (PF) 250 MCG/5ML IJ SOLN
INTRAMUSCULAR | Status: AC
  Filled 2020-07-01: qty 5

## 2020-07-01 MED ORDER — SIMETHICONE 80 MG PO CHEW
80.0000 mg | CHEWABLE_TABLET | Freq: Three times a day (TID) | ORAL | Status: DC
  Administered 2020-07-02 – 2020-07-04 (×7): 80 mg via ORAL
  Filled 2020-07-01 (×6): qty 1

## 2020-07-01 MED ORDER — OXYCODONE HCL 5 MG PO TABS
5.0000 mg | ORAL_TABLET | ORAL | Status: DC | PRN
  Administered 2020-07-01 – 2020-07-04 (×8): 10 mg via ORAL
  Filled 2020-07-01 (×8): qty 2

## 2020-07-01 MED ORDER — MAGNESIUM SULFATE 40 GM/1000ML IV SOLN
2.0000 g/h | INTRAVENOUS | Status: AC
  Administered 2020-07-01 (×2): 2 g/h via INTRAVENOUS
  Filled 2020-07-01: qty 1000

## 2020-07-01 MED ORDER — INSULIN ASPART 100 UNIT/ML ~~LOC~~ SOLN: 0.0000 [IU] | Freq: Every day | SUBCUTANEOUS | Status: DC

## 2020-07-01 MED ORDER — WITCH HAZEL-GLYCERIN EX PADS: 1.0000 "application " | MEDICATED_PAD | CUTANEOUS | Status: DC | PRN

## 2020-07-01 MED ORDER — COCONUT OIL OIL: 1.0000 "application " | TOPICAL_OIL | Status: DC | PRN

## 2020-07-01 MED ORDER — DIPHENHYDRAMINE HCL 50 MG/ML IJ SOLN
25.0000 mg | Freq: Once | INTRAMUSCULAR | Status: AC
  Administered 2020-07-01: 25 mg via INTRAVENOUS
  Filled 2020-07-01: qty 1

## 2020-07-01 MED ORDER — OXYTOCIN-SODIUM CHLORIDE 30-0.9 UT/500ML-% IV SOLN: 2.5000 [IU]/h | INTRAVENOUS | Status: AC

## 2020-07-01 MED ORDER — NALOXONE HCL 4 MG/10ML IJ SOLN
1.0000 ug/kg/h | INTRAVENOUS | Status: DC | PRN
  Filled 2020-07-01: qty 5

## 2020-07-01 MED ORDER — OXYCODONE HCL 5 MG PO TABS: 5.0000 mg | ORAL_TABLET | Freq: Once | ORAL | Status: DC | PRN

## 2020-07-01 MED ORDER — ACETAMINOPHEN 325 MG PO TABS: 650.0000 mg | ORAL_TABLET | Freq: Once | ORAL | Status: DC

## 2020-07-01 MED ORDER — OXYCODONE-ACETAMINOPHEN 5-325 MG PO TABS
1.0000 | ORAL_TABLET | ORAL | Status: DC | PRN
  Filled 2020-07-01: qty 2

## 2020-07-01 MED ORDER — ACETAMINOPHEN 10 MG/ML IV SOLN
INTRAVENOUS | Status: DC | PRN
  Administered 2020-07-01: 1000 mg via INTRAVENOUS

## 2020-07-01 MED ORDER — DOCUSATE SODIUM 100 MG PO CAPS
200.0000 mg | ORAL_CAPSULE | Freq: Two times a day (BID) | ORAL | Status: DC
  Administered 2020-07-01 – 2020-07-04 (×6): 200 mg via ORAL
  Filled 2020-07-01 (×6): qty 2

## 2020-07-01 MED ORDER — TETANUS-DIPHTH-ACELL PERTUSSIS 5-2.5-18.5 LF-MCG/0.5 IM SUSY: 0.5000 mL | PREFILLED_SYRINGE | Freq: Once | INTRAMUSCULAR | Status: DC

## 2020-07-01 NOTE — Progress Notes (Signed)
Notified DR PRATT of lab not having enough blood specimen to obtain platlets     OKAY to just get H&H   Lab called  To run specimen and release results

## 2020-07-01 NOTE — Progress Notes (Signed)
Inpatient Diabetes Program Recommendations  AACE/ADA: New Consensus Statement on Inpatient Glycemic Control (2015)  Target Ranges:  Prepandial:   less than 140 mg/dL      Peak postprandial:   less than 180 mg/dL (1-2 hours)      Critically ill patients:  140 - 180 mg/dL   Lab Results  Component Value Date   GLUCAP 159 (H) 07/01/2020   HGBA1C 4.8 05/08/2020    Review of Glycemic Control Results for [name], Terri Bradford (MRN [id number]) as of 07/01/2020 08:54  Ref. Range 06/30/2020 14:09 06/30/2020 18:08 07/01/2020 04:14 07/01/2020 08:15  Glucose-Capillary Latest Ref Range: 70 - 99 mg/dL 80 979 (H) 892 (H) 119 (H)   Diabetes history: Type 2 DM Outpatient Diabetes medications: Metformin 500 mg BID Current orders for Inpatient glycemic control: none Decadron 10 mg x 1  Inpatient Diabetes Program Recommendations:    In the setting of steroids, consider adding Novolog 0-9 units TID & HS. Thanks, Lujean Rave, MSN, RNC-OB Diabetes Coordinator [phone] (8a-5p)

## 2020-07-01 NOTE — Transfer of Care (Signed)
Immediate Anesthesia Transfer of Care Note  Patient: Terri Bradford  Procedure(s) Performed: CESAREAN SECTION (N/A ) HYSTERECTOMY ABDOMINAL (Bilateral ) CYSTOSCOPY (N/A )  Patient Location: PACU  Anesthesia Type:General  Level of Consciousness: awake, alert  and oriented  Airway & Oxygen Therapy: Patient Spontanous Breathing, nasal canula  Post-op Assessment: Report given to RN and Post -op Vital signs reviewed and stable  Post vital signs: Reviewed and stable  Last Vitals:  Vitals Value Taken Time  BP 112/86 07/01/20 0045  Temp 36.6 C 07/01/20 0027  Pulse 78 07/01/20 0049  Resp 14 07/01/20 0049  SpO2 100 % 07/01/20 0049  Vitals shown include unvalidated device data.  Last Pain:  Vitals:   07/01/20 0030  TempSrc:   PainSc: (P) 0-No pain         Complications: No complications documented.

## 2020-07-01 NOTE — Progress Notes (Signed)
OB Note  I d/w pt now that she's awake re: her surgery. She is understanding of the situation. She desires to eat but will recheck labs first this morning  Patient Vitals for the past 12 hrs:  BP Temp Temp src Pulse Resp SpO2  07/01/20 0739 (!) 103/56 98 F (36.7 C) Oral 70 17 100 %  07/01/20 0616 -- -- -- -- -- 100 %  07/01/20 0534 -- -- -- -- -- 100 %  07/01/20 0509 -- -- -- -- -- 98 %  07/01/20 0500 101/76 -- -- 72 11 98 %  07/01/20 0400 92/62 -- -- 69 12 98 %  07/01/20 0250 114/73 (!) 96.6 F (35.9 C) Axillary 75 12 99 %  07/01/20 0205 -- -- -- -- -- 96 %  07/01/20 0150 96/67 (!) 96.6 F (35.9 C) Axillary 65 (!) 9 96 %  07/01/20 0130 107/79 98.1 F (36.7 C) Axillary 62 (!) 8 96 %  07/01/20 0115 103/79 -- -- 67 10 95 %  07/01/20 0100 107/81 -- -- 70 11 100 %  07/01/20 0045 112/86 -- -- 80 18 98 %  07/01/20 0030 (!) 124/97 -- -- 76 16 98 %  07/01/20 0027 124/89 97.9 F (36.6 C) Axillary 74 14 98 %     UOP low with about 5mL in the bag currently scant serosang in color. Pad with scant old blood and pressure dressing c/d/i. Ctab. Normal s1 and s2, no mrgs. 2+ brachial  Follow up rpt labs and will also get a mg level. Lasix 20 iv x 1 ordered  Cornelia Copa MD Attending Center for [facility]) GYN Consult Phone: [phone] (M-F, [phone]) & [phone] (Off hours, weekends, holidays)

## 2020-07-01 NOTE — Anesthesia Procedure Notes (Signed)
Arterial Line Insertion Start/End11/29/2021 9:03 PM, 06/30/2020 9:10 PM Performed by: Lannie Fields, DO, anesthesiologist  Patient location: Pre-op. Preanesthetic checklist: patient identified, IV checked, site marked, risks and benefits discussed, surgical consent, monitors and equipment checked, pre-op evaluation, timeout performed and anesthesia consent Lidocaine 1% used for infiltration Left, radial was placed Catheter size: 20 Fr Hand hygiene performed  and maximum sterile barriers used   Attempts: 1 Procedure performed without using ultrasound guided technique. Following insertion, dressing applied. Post procedure assessment: normal and unchanged  Patient tolerated the procedure well with no immediate complications.

## 2020-07-01 NOTE — Lactation Note (Signed)
This note was copied from a baby's chart. Lactation Consultation Note Mom is formula feeding baby. Baby in NICU.  Patient Name: Terri Bradford SWNIO'E Date: 07/01/2020     Maternal Data    Feeding Feeding Type: Formula  LATCH Score                   Interventions    Lactation Tools Discussed/Used     Consult Status Consult Status: Complete    [name] G 07/01/2020, 5:06 AM

## 2020-07-01 NOTE — Op Note (Signed)
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 Pelvic adhesive disease. Postpartum hemorrhage   PROCEDURE: Stat repeat low transverse skin incision via Joel-Cohen incision. Conversion to supracervical hysterectomy, left salpingo-oophrectomy, cystoscopy  SURGEON: Surgeon(s) and Role:    * McAlmont Bing, MD - Primary  ASSISTANT:    * Eure, Amaryllis Dyke, MD - Assisting  An experienced assistant was required given the standard of surgical care given the complexity of the case.  This assistant was needed for exposure, dissection, suctioning, retraction, instrument exchange, and for overall help during the procedure   ANESTHESIA: general  ESTIMATED BLOOD LOSS:  DRAINS: UOP via indwelling foley  TOTAL IV FLUIDS: , which includes two units of albulmin  VTE PROPHYLAXIS: SCDs to bilateral lower extremities  ANTIBIOTICS: Ancef 2gm and azithromycin 500mg  IV were given intra-operatively and re-dosed during the surgery  SPECIMENS: placenta. Uterus. Left tube and ovary  COMPLICATIONS: left uterine extension into the uterine vessels with inability to control bleeding without hysterectomy.   INDICATIONS: Patient was induced a day early on 11/28 due to presenting to [facility] triage with VB; she was to be induced for [facility].  Early evening of 11/30 she was having persistent and worsening fetal decelerations and fetal presentation persisted to be compound and 5-6cm in cervical dilation and inability to augment with pitocin due to the decelerations and aminoinfusion. Plan of care discussed with mom, and I recommend a repeat c-section, but she had a prolonged deceleration so she was verbally consented for a  stat c-section.  FINDINGS: Moderate intra-abdominal adhesions were noted in the midline in the area of the bladder. Grossly normal uterus, ovaries and tubes with filmy adhesions on both. Clear amniotic fluid, compound presentation, female infant, weight 2400gm, APGARs 2/5/7, intact placenta. Cord gases sent but unable to be run.  Left extension into the uterine vessels.   PROCEDURE IN DETAIL: The patient was taken to the operating room directly from her room for a stat c-section. Once on the c-section bed, fetal heart tones were unable to be confirmed so preparation for a stat c-section was made.  Once she was prepped and draped, she underwent general anesthesia and a 04-23-1990 skin incision was made with the scalpel just above her old incision. The fascia was then incised at the midline The rectus muscles were then separated in the midline and the peritoneum was entered bluntly. She had moderate adhesions in the midline. The bladder blade was inserted.   A low transverse hysterotomy was made with the scalpel until the endometrial cavity was breached and the amniotic sac ruptured, yielding clear amniotic fluid. This incision was extended bluntly.  There was great difficulty in delivering the infant due to an anterior placenta and the fetus being very low down and difficult to bring up. He was brought up but then had a floating station so the baby was delivered breech with the standard maneuvers..The cord was clamped x 2 and cut, and the infant was handed to the awaiting pediatricians.  The placenta was then gradually expressed from the uterus and I had to repair it in situ due to the adhesions. This was done with 1-0 monocryl and a second imbricating layer was then placed. Multiple figure of eight sutures were done with 1-0 monocryl including Claretha Cooper  stitches on the left side but there was persistent bleeding. The scar tissue was lysed and the uterus was able to be exteriorized. More sutures were placed but  the bleeding persistent. Given this, the decision was made to converted to a supracervical hysterectomy. The bladder was taken down more and bilateral Kelly clamps were doubly clamped across the utero-ovarian ligaments after a window was made in the meso-salpingx. These were cut and tied with 1-0 vicryl. Multiple sutures were placed at the ovarian pedicles for hemostasis bilaterally. The bilateral utero ovarian ligaments were clamped with Heaneys and cut and suture ligated. At this point, Dr. Despina Hidden arrived for assistance.  The bladder was further dissected down and the uterus was bissected with clamps and the anterior uterus cut and then the posterior aspect cut and the uterus handed off. Next the cuff was then sewn with multiple figure of eight sutures. The left ovarian pedical persisted in bleeding so the decision was made to do an LSO by clamping, cutting and suture ligated below the left fallopian tube. Multiple figure of eight sutures were then placed at the cuff to help with hemostasis; Arista was then applied.  Attention was then turned to a cystoscopy. The foley was removed and a 77F/70 degree scope used to instill the bladder. There was a bladder bubble at the dome and the left UO was seen with +efflux of urine but the right was obscured and unable to see the right UO although it did appear that there was efflux from somewhere else in the bladder when observing the left UO.  The bladder was drained and a foley replaced.    The peritoneum was closed with a running stitch of 3-0 Vicryl. The fascia was reapproximated with 0 Vicryl in a simple running fashion bilaterally. The subcutaneous layer was then reapproximated with interrupted sutures of 2-0 plain gut, and the skin was then closed staples and a pressure dressing placed  The patient  tolerated the procedure well. Sponge, lap, and instrument counts were correct x 2 but an x-ray was done because needles were not counted prior to start of procedure;  the x-ray was negative.  The patient was transferred to the recovery room awake, alert and breathing independently in stable condition.  Cornelia Copa MD Attending Center for [facility])

## 2020-07-01 NOTE — Progress Notes (Signed)
Patient screened out for psychosocial assessment since none of the following apply: °Psychosocial stressors documented in mother or baby's chart °Gestation less than 32 weeks °Code at delivery  °Infant with anomalies °Please contact the Clinical Social Worker if specific needs arise, by MOB's request, or if MOB scores greater than 9/yes to question 10 on [place] Postpartum Depression Screen. ° °Lakaya Tolen Boyd-Gilyard, MSW, LCSW °Clinical Social Work °[phone] °  °

## 2020-07-01 NOTE — Addendum Note (Signed)
Addendum  created 07/01/20 0144 by Armanda Heritage, CRNA   Intraprocedure Event deleted, Intraprocedure Event edited

## 2020-07-01 NOTE — Progress Notes (Signed)
Obstetrics Post Op Check Note  Admission Date: 06/29/2020 Current Date: 07/01/2020 2:13 AM  Terri Bradford is a 39 y.o.  POD#1 s/p c-hyst/lso/cysto for NRFHT and PPH    History complicated by: Patient Active Problem List   Diagnosis Date Noted  . Thrombocytopenia (HCC) 01/04/2020  . History of intrauterine fetal death in previous pregnancy 01-26-2020  . Chronic hypertension in pregnancy 01/26/20  . History of cesarean section 01/26/2020  . AMA (advanced maternal age) multigravida 35+ 26-Jan-2020  . Supervision of high risk pregnancy, antepartum 12/26/2019  . Type 2 diabetes mellitus in pregnancy, second trimester 12/12/2014    ROS and patient/family/surgical history, located on admission H&P note dated 06/29/2020, have been reviewed, and there are no changes except as noted below Yesterday/Overnight Events:  n/a  Subjective:  Pt just transferred to the ward  Objective:    Current Vital Signs 24h Vital Sign Ranges  T (!) 96.6 F (35.9 C) Temp  Avg: 98 F (36.7 C)  Min: 96.6 F (35.9 C)  Max: 98.7 F (37.1 C)  BP 96/67 BP  Min: 96/67  Max: 198/89  HR 65 Pulse  Avg: 77.9  Min: 62  Max: 99  RR (!) 9 Resp  Avg: 17.2  Min: 8  Max: 20  SaO2 96 % Room Air SpO2  Avg: 98.5 %  Min: 94 %  Max: 100 %       24 Hour I/O Current Shift I/O  Time Ins Outs 11/29 0701 - 11/30 0700 In: 7232 [I.V.:2600] Out: 6089 [Urine:1250] 11/29 1901 - 11/30 0700 In: 7232 [I.V.:2600] Out: 5439 [Urine:600]   Patient Vitals for the past 12 hrs:  BP Temp Temp src Pulse Resp SpO2  07/01/20 0205 -- -- -- -- -- 96 %  07/01/20 0150 96/67 (!) 96.6 F (35.9 C) Axillary 65 (!) 9 96 %  07/01/20 0130 107/79 98.1 F (36.7 C) Axillary 62 (!) 8 96 %  07/01/20 0115 103/79 -- -- 67 10 95 %  07/01/20 0100 107/81 -- -- 70 11 100 %  07/01/20 0045 112/86 -- -- 80 18 98 %  07/01/20 0030 (!) 124/97 -- -- 76 16 98 %  07/01/20 0027 124/89 97.9 F (36.6 C) Axillary 74 14 98 %  06/30/20 1856 (!) 164/93 -- -- 89 -- --   06/30/20 1849 (!) 178/92 -- -- 84 -- --  06/30/20 1820 (!) 171/78 -- -- 73 20 --  06/30/20 1803 -- -- -- -- 20 100 %  06/30/20 1801 (!) 198/89 -- -- 69 -- --  06/30/20 1737 (!) 157/95 -- -- 75 17 94 %  06/30/20 1735 (!) 167/102 -- -- 85 -- --  06/30/20 1731 (!) 193/83 -- -- 70 -- --  06/30/20 1701 (!) 141/85 -- -- 76 20 97 %  06/30/20 1645 (!) 141/88 -- -- 83 17 99 %  06/30/20 1631 (!) 165/92 -- -- 82 18 100 %  06/30/20 1625 (!) 144/86 -- -- 80 16 100 %  06/30/20 1602 (!) 181/86 98.3 F (36.8 C) Oral 78 20 98 %  06/30/20 1531 (!) 148/80 -- -- 78 16 99 %  06/30/20 1501 127/90 -- -- 81 20 95 %  06/30/20 1431 (!) 157/89 -- -- 76 18 99 %   UOP: 51mL/hr  Physical exam: General appearance: sleeping Abdomen: pressure dressing in place GU: No gross VB. Just changed prior to transfer. UOP with serosang output in bag, minimal amount Lungs: pt snoring. RR 12-14 Heart: S1, S2 normal, no murmur,  rub or gallop, regular rate and rhythm   Medications Current Facility-Administered Medications  Medication Dose Route Frequency Provider Last Rate Last Admin  . 0.9 %  sodium chloride infusion (Manually program via Guardrails IV Fluids)   Intravenous Once [place] BingPickens, Magaline Steinberg, MD      . 0.9 %  sodium chloride infusion (Manually program via Guardrails IV Fluids)   Intravenous Once [place] BingPickens, Coila Wardell, MD      . acetaminophen (TYLENOL) tablet 1,000 mg  1,000 mg Oral Q6H Finucane, Elizabeth M, DO      . azithromycin (ZITHROMAX) 500 mg in sodium chloride 0.9 % 250 mL IVPB  500 mg Intravenous 60 min Pre-Op [place] BingPickens, Deric Bocock, MD      . ceFAZolin (ANCEF) IVPB 2g/100 mL premix  2 g Intravenous Once [place] BingPickens, Macrina Lehnert, MD      . coconut oil  1 application Topical PRN [place] BingPickens, Dontez Hauss, MD      . [place] hazel-glycerin (TUCKS) pad 1 application  1 application Topical PRN [place] BingPickens, Lorriann Hansmann, MD       And  . dibucaine (NUPERCAINAL) 1 % rectal ointment 1 application  1 application Rectal PRN [place] BingPickens, Markiah Janeway, MD      .  diphenhydrAMINE (BENADRYL) injection 12.5 mg  12.5 mg Intravenous Q4H PRN Lacy DuverneyFinucane, Elizabeth M, DO       Or  . diphenhydrAMINE (BENADRYL) capsule 25 mg  25 mg Oral Q4H PRN Lacy DuverneyFinucane, Elizabeth M, DO      . diphenhydrAMINE (BENADRYL) capsule 25 mg  25 mg Oral Q6H PRN [place] BingPickens, Tommie Dejoseph, MD      . labetalol (NORMODYNE) injection 20 mg  20 mg Intravenous PRN [place] BingPickens, Bohdan Macho, MD   20 mg at 06/30/20 1837   And  . labetalol (NORMODYNE) injection 40 mg  40 mg Intravenous PRN [place] BingPickens, Arris Meyn, MD   40 mg at 06/30/20 1856   And  . labetalol (NORMODYNE) injection 80 mg  80 mg Intravenous PRN [place] BingPickens, Sherhonda Gaspar, MD       And  . hydrALAZINE (APRESOLINE) injection 10 mg  10 mg Intravenous PRN [place] BingPickens, Birdell Frasier, MD      . ketorolac (TORADOL) 30 MG/ML injection 30 mg  30 mg Intravenous Q6H PRN Lacy DuverneyFinucane, Elizabeth M, DO       Or  . ketorolac (TORADOL) 30 MG/ML injection 30 mg  30 mg Intramuscular Q6H PRN Lacy DuverneyFinucane, Elizabeth M, DO      . lactated ringers bolus 1,000 mL  1,000 mL Intravenous Once [place] BingPickens, Iylah Dworkin, MD       Followed by  . lactated ringers infusion   Intravenous Continuous [place] BingPickens, Vicy Medico, MD      . lactated ringers infusion   Intravenous Continuous [place] BingPickens, Prerna Harold, MD      . magnesium sulfate 40 grams in SWI 1000 mL OB infusion  2 g/hr Intravenous Continuous [place] BingPickens, Briley Bumgarner, MD      . menthol-cetylpyridinium (CEPACOL) lozenge 3 mg  1 lozenge Oral Q2H PRN  BingPickens, Corrissa Martello, MD      . nalbuphine (NUBAIN) injection 5 mg  5 mg Intravenous Q4H PRN Lacy DuverneyFinucane, Elizabeth M, DO       Or  . nalbuphine (NUBAIN) injection 5 mg  5 mg Subcutaneous Q4H PRN Lacy DuverneyFinucane, Elizabeth M, DO      . nalbuphine (NUBAIN) injection 5 mg  5 mg Intravenous Once PRN Lacy DuverneyFinucane, Elizabeth M, DO       Or  . nalbuphine (NUBAIN) injection 5 mg  5 mg Subcutaneous Once PRN Lacy DuverneyFinucane, Elizabeth M, DO      . naloxone (  NARCAN) injection 0.4 mg  0.4 mg Intravenous PRN Lacy Duverney M, DO       And  . sodium chloride flush (NS) 0.9  % injection 3 mL  3 mL Intravenous PRN Lacy Duverney M, DO      . naloxone HCl (NARCAN) 2 mg in dextrose 5 % 250 mL infusion  1-4 mcg/kg/hr Intravenous Continuous PRN Lacy Duverney M, DO      . ondansetron [facility]) injection 4 mg  4 mg Intravenous Q8H PRN Lacy Duverney M, DO      . oxytocin (PITOCIN) IV infusion 30 units in NS 500 mL - Premix  2.5 Units/hr Intravenous Continuous [place] Bing, MD      . Melene Muller ON 07/02/2020] polyethylene glycol (MIRALAX / GLYCOLAX) packet 17 g  17 g Oral Daily [place] Bing, MD      . prenatal multivitamin tablet 1 tablet  1 tablet Oral Q1200 [place] Bing, MD      . scopolamine (TRANSDERM-SCOP) 1 MG/3DAYS 1.5 mg  1 patch Transdermal Once Finucane, Elizabeth M, DO      . [START ON 07/02/2020] senna-docusate (Senokot-S) tablet 2 tablet  2 tablet Oral QHS PRN [place] Bing, MD      . Melene Muller ON 07/02/2020] simethicone (MYLICON) chewable tablet 80 mg  80 mg Oral TID [place] Bing, MD      . sodium citrate-citric acid (ORACIT) solution 30 mL  30 mL Oral 30 min Pre-Op [place] Bing, MD      . Melene Muller ON 07/02/2020] Tdap (BOOSTRIX) injection 0.5 mL  0.5 mL Intramuscular Once [place] Bing, MD      . zolpidem (AMBIEN) tablet 5 mg  5 mg Oral QHS PRN  Bing, MD          Labs  Recent Labs  Lab 06/30/20 1841 06/30/20 2038 06/30/20 2155 06/30/20 2205 06/30/20 2222 06/30/20 2315 06/30/20 2317 07/01/20 0003  WBC 23.6*  --  16.1*  --   --   --  12.9*  --   HGB 12.6   < > 8.4*  --    < > 8.5* 9.9* 8.2*  HCT 37.4   < > 25.8*  --    < > 25.0* 29.7* 24.0*  PLT 62*   < > 61* 61*  --   --  73*  73*  --    < > = values in this interval not displayed.    Recent Labs  Lab 06/30/20 2155 06/30/20 2222 06/30/20 2317 07/01/20 0003 07/01/20 0124  NA 137   < > 138 139 136  K 4.1   < > 3.9 3.7 4.1  CL 107  --  105  --  105  CO2 19*  --  21*  --  21*  BUN <5*  --  6  --  6  CREATININE 0.63  --  0.62  --  0.66  CALCIUM  7.1*  --  8.4*  --  8.9  PROT <3.0*  --  3.8*  --  3.8*  BILITOT 1.0  --  2.1*  --  3.4*  ALKPHOS 53  --  45  --  53  ALT 9  --  11  --  11  AST 17  --  19  --  24  GLUCOSE 187*  --  182*  --  176*   < > = values in this interval not displayed.   Results for Terri Bradford, Terri Bradford (MRN [id number]) as of 07/01/2020 02:52  Ref. Range 06/30/2020 22:05  06/30/2020 23:17 07/01/2020 01:24  Fibrinogen Latest Ref Range: 210 - 475 mg/dL 237 (L) 628 315    Results for [place], Terri Bradford (MRN [id number]) as of 07/01/2020 02:52  Ref. Range 07/01/2020 01:24  Prothrombin Time Latest Ref Range: 11.4 - 15.2 seconds 16.1 (H)  INR Latest Ref Range: 0.8 - 1.2  1.4 (H)    Results for Terri Bradford, Terri Bradford (MRN [id number]) as of 07/01/2020 02:52  Ref. Range 07/01/2020 01:24  APTT Latest Ref Range: 24 - 36 seconds 32   Radiology No new imaging  Assessment & Plan:  Pt doing well. Continue to follow closely. NPO, MIVF with Mg. Maintain foley. Rpt labs at noon [place] Bing, Montez Hageman. MD Attending Center for [employer] [facility])

## 2020-07-01 NOTE — Anesthesia Postprocedure Evaluation (Signed)
Anesthesia Post Note  Patient: Shaunee M Thies  Procedure(s) Performed: CESAREAN SECTION (N/A ) HYSTERECTOMY ABDOMINAL (Bilateral ) CYSTOSCOPY (N/A )     Patient location during evaluation: PACU Anesthesia Type: General Level of consciousness: awake and alert, oriented and patient cooperative Pain management: pain level controlled Vital Signs Assessment: post-procedure vital signs reviewed and stable Respiratory status: spontaneous breathing, nonlabored ventilation and respiratory function stable Cardiovascular status: blood pressure returned to baseline and stable Postop Assessment: no apparent nausea or vomiting Anesthetic complications: no   No complications documented.  Last Vitals:  Vitals:   07/01/20 0045 07/01/20 0100  BP: 112/86 107/81  Pulse: 80 70  Resp: 18 11  Temp:    SpO2: 98% 100%    Last Pain:  Vitals:   07/01/20 0100  TempSrc:   PainSc: 0-No pain   Pain Goal:                   Lannie Fields

## 2020-07-02 ENCOUNTER — Encounter (HOSPITAL_COMMUNITY): Payer: Self-pay | Admitting: Obstetrics and Gynecology

## 2020-07-02 LAB — COMPREHENSIVE METABOLIC PANEL
ALT: 15 U/L (ref 0–44)
AST: 30 U/L (ref 15–41)
Albumin: 1.9 g/dL — ABNORMAL LOW (ref 3.5–5.0)
Alkaline Phosphatase: 51 U/L (ref 38–126)
Anion gap: 7 (ref 5–15)
BUN: 6 mg/dL (ref 6–20)
CO2: 24 mmol/L (ref 22–32)
Calcium: 7.5 mg/dL — ABNORMAL LOW (ref 8.9–10.3)
Chloride: 106 mmol/L (ref 98–111)
Creatinine, Ser: 0.75 mg/dL (ref 0.44–1.00)
GFR, Estimated: >60 mL/min (ref >60)
Glucose, Bld: 139 mg/dL — ABNORMAL HIGH (ref 70–99)
Potassium: 3.6 mmol/L (ref 3.5–5.1)
Sodium: 137 mmol/L (ref 135–145)
Total Bilirubin: 1.1 mg/dL (ref 0.3–1.2)
Total Protein: 4 g/dL — ABNORMAL LOW (ref 6.5–8.1)

## 2020-07-02 LAB — CBC
HCT: 20.9 % — ABNORMAL LOW (ref 36.0–46.0)
Hemoglobin: 7.2 g/dL — ABNORMAL LOW (ref 12.0–15.0)
MCH: 31.3 pg (ref 26.0–34.0)
MCHC: 34.4 g/dL (ref 30.0–36.0)
MCV: 90.9 fL (ref 80.0–100.0)
Platelets: 67 10*3/uL — ABNORMAL LOW (ref 150–400)
RBC: 2.3 MIL/uL — ABNORMAL LOW (ref 3.87–5.11)
RDW: 17.3 % — ABNORMAL HIGH (ref 11.5–15.5)
WBC: 19.6 10*3/uL — ABNORMAL HIGH (ref 4.0–10.5)
nRBC: 0 % (ref 0.0–0.2)

## 2020-07-02 LAB — GLUCOSE, CAPILLARY
Glucose-Capillary: 104 mg/dL — ABNORMAL HIGH (ref 70–99)
Glucose-Capillary: 129 mg/dL — ABNORMAL HIGH (ref 70–99)
Glucose-Capillary: 90 mg/dL (ref 70–99)

## 2020-07-02 LAB — SURGICAL PATHOLOGY

## 2020-07-02 NOTE — Progress Notes (Signed)
Inpatient Diabetes Program Recommendations  AACE/ADA: New Consensus Statement on Inpatient Glycemic Control (2015)  Target Ranges:  Prepandial:   less than 140 mg/dL      Peak postprandial:   less than 180 mg/dL (1-2 hours)      Critically ill patients:  140 - 180 mg/dL   Lab Results  Component Value Date   GLUCAP 104 (H) 07/02/2020   HGBA1C 4.8 05/08/2020    Review of Glycemic Control Results for Terri Bradford, Terri Bradford (MRN [id number]) as of 07/02/2020 11:04  Ref. Range 07/01/2020 11:35 07/02/2020 00:36 07/02/2020 07:45  Glucose-Capillary Latest Ref Range: 70 - 99 mg/dL 290 (H) 211 (H) 155 (H)   Diabetes history: Type 2 DM Outpatient Diabetes medications: Metformin 500 mg BID Current orders for Inpatient glycemic control: none Decadron 10 mg x 1 (given on 11/30)  Inpatient Diabetes Program Recommendations:   Of note, patient refusing AM insulin. In agreement, given FSBG was 104 mg/dL and patient was in 20'E mg/dL prior to steroid administration.   Consider discontinuing Novolog 4 units TID.   Thanks, Lujean Rave, MSN, RNC-OB Diabetes Coordinator [phone] (8a-5p)

## 2020-07-02 NOTE — Progress Notes (Signed)
Subjective: Postpartum Day 1: Cesarean Delivery, hysterectomy Patient reports incisional pain, tolerating PO and no problems voiding.    Objective: Vital signs in last 24 hours: Temp:  [98.1 F (36.7 C)-98.5 F (36.9 C)] 98.2 F (36.8 C) (12/01 1059) Pulse Rate:  [78-90] 90 (12/01 1059) Resp:  [17-20] 18 (12/01 1059) BP: (107-136)/(58-77) 136/76 (12/01 1059) SpO2:  [97 %-100 %] 98 % (12/01 1059)  Physical Exam:  General: alert, cooperative and no distress Lochia: appropriate Uterine Fundus: n/a Incision: no significant drainage DVT Evaluation: No evidence of DVT seen on physical exam.  Recent Labs    07/01/20 1643 07/02/20 0542  HGB 8.6* 7.2*  HCT 24.8* 20.9*    Assessment/Plan: Status post Cesarean section. Postoperative course complicated by anemia  Continue current care. Repeat CBC tomorrow  Scheryl Darter 07/02/2020, 2:28 PM

## 2020-07-03 ENCOUNTER — Encounter (HOSPITAL_COMMUNITY): Payer: Self-pay | Admitting: Obstetrics and Gynecology

## 2020-07-03 LAB — BPAM RBC
Blood Product Expiration Date: 202112252359
Blood Product Expiration Date: 202112252359
Blood Product Expiration Date: 202112252359
Blood Product Expiration Date: 202112252359
Blood Product Expiration Date: 202112252359
Blood Product Expiration Date: 202112252359
Blood Product Expiration Date: 202112262359
Blood Product Expiration Date: 202112282359
Blood Product Expiration Date: 202201012359
ISSUE DATE / TIME: 202111292035
ISSUE DATE / TIME: 202111292049
ISSUE DATE / TIME: 202111292049
ISSUE DATE / TIME: 202111292128
ISSUE DATE / TIME: 202111292150
ISSUE DATE / TIME: 202111292228
ISSUE DATE / TIME: 202111301329
Unit Type and Rh: 7300
Unit Type and Rh: 7300
Unit Type and Rh: 7300
Unit Type and Rh: 7300
Unit Type and Rh: 7300
Unit Type and Rh: 7300
Unit Type and Rh: 7300
Unit Type and Rh: 7300
Unit Type and Rh: 7300

## 2020-07-03 LAB — TYPE AND SCREEN
ABO/RH(D): B POS
Antibody Screen: NEGATIVE
Unit division: 0
Unit division: 0
Unit division: 0
Unit division: 0
Unit division: 0
Unit division: 0
Unit division: 0
Unit division: 0
Unit division: 0

## 2020-07-03 LAB — CBC
HCT: 21.3 % — ABNORMAL LOW (ref 36.0–46.0)
Hemoglobin: 7.1 g/dL — ABNORMAL LOW (ref 12.0–15.0)
MCH: 31.3 pg (ref 26.0–34.0)
MCHC: 33.3 g/dL (ref 30.0–36.0)
MCV: 93.8 fL (ref 80.0–100.0)
Platelets: 75 10*3/uL — ABNORMAL LOW (ref 150–400)
RBC: 2.27 MIL/uL — ABNORMAL LOW (ref 3.87–5.11)
RDW: 17.3 % — ABNORMAL HIGH (ref 11.5–15.5)
WBC: 21.5 10*3/uL — ABNORMAL HIGH (ref 4.0–10.5)
nRBC: 0 % (ref 0.0–0.2)

## 2020-07-03 NOTE — Progress Notes (Signed)
Subjective: Postpartum Day 3: Cesarean Delivery w/hysterectomy, supracervical Patient reports incisional pain, tolerating PO, + flatus, + BM and no problems voiding.    Objective: Vital signs in last 24 hours: Temp:  [98.1 F (36.7 C)-98.5 F (36.9 C)] 98.4 F (36.9 C) (12/02 1953) Pulse Rate:  [84-98] 84 (12/02 2053) Resp:  [17-18] 18 (12/02 1953) BP: (124-161)/(75-90) 161/87 (12/02 2053) SpO2:  [97 %-100 %] 100 % (12/02 1953)  Physical Exam:  General: alert, cooperative and no distress Lochia: appropriate Uterine Fundus: firm Incision: no significant drainage, no significant erythema, dressing in place DVT Evaluation: No evidence of DVT seen on physical exam.  Recent Labs    07/02/20 0542 07/03/20 0524  HGB 7.2* 7.1*  HCT 20.9* 21.3*    Assessment/Plan: Status post Cesarean section. Doing well postoperatively.  Hemoglobin stable, recheck in am, may be able to go home tomorrow.  Lazaro Arms 07/03/2020, 9:18 PM

## 2020-07-03 NOTE — Clinical Social Work Maternal (Signed)
CLINICAL SOCIAL WORK MATERNAL/CHILD NOTE  Patient Details  Name: Terri Bradford MRN: [id number] Date of Birth: 07-May-1981  Date:  07/03/2020  Clinical Social Worker Initiating Note:  Laurey Arrow Date/Time: Initiated:  07/03/20/1159     Child's Name:  Terri Bradford   Biological Parents:  Mother, Father (FOB is Terri Bradford (92/22/1985; [phone]).)   Need for Interpreter:  None   Reason for Referral:  Behavioral Health Concerns   Address:  [place] [address]    Phone number:  [phone] (home)     Additional phone number: FOB's number is [phone]  Household Members/Support Persons (HM/SP):   Household Member/Support Person 1 (MOB has 2 older son's. MOB oldest son Burnetta Sabin 02/22/95) does not live in MOB's home.)   HM/SP Name Relationship DOB or Age  HM/SP -1 Bennie Dallas son 12/20/2012  HM/SP -2        HM/SP -3        HM/SP -4        HM/SP -5        HM/SP -6        HM/SP -7        HM/SP -8          Natural Supports (not living in the home):  Friends, Immediate Family, Spouse/significant other   Professional Supports: Therapist (MOB agreed to counseling; a referral was sent to [facility].)   Employment: Unemployed   Type of Work:     Education:  9 to 11 years   Homebound arranged: No (MOB reported that she completed 10th grade.)  Financial Resources:  [employer]   Other Resources:  [employer], Physicist, medical     Cultural/Religious Considerations Which May Impact Care:  None reported  Strengths:  Ability to meet basic needs  , Home prepared for child  , Understanding of illness, Pediatrician chosen   Psychotropic Medications:         Pediatrician:    [place]  Pediatrician List:   [facility] for [place]      Pediatrician Fax Number:    Risk Factors/Current Problems:  Mental Health Concerns  ,  Transportation     Cognitive State:  Alert  , Insightful  , Goal Oriented  , Linear Thinking     Mood/Affect:  Interested  , Comfortable  , Tearful  , Sad     CSW Assessment: CSW met with MOB in her first floor room/116 to introduce myself, offer support and complete assessment due to NICU admission at 37 weeks.  MOB was pleasant and receptive to meeting with CSW.  MOB reported feeling better today and seems to have a good understanding of baby's medical situation however, is going to request to speak to the Neo or NNP when she visits with infant today.     CSW asked MOB to share her story of labor and delivery as well as baby's admission to NICU and how she felt emotionally throughout her experience.  MOB was tearful but open to talking with CSW and sharing her feelings.  She states baby's admission to NICU was "sad and so unexpected." However she knows he need to be admitted in order to get the proper care. MOB understand that NICU admission was necessary and she acknowledges the situation as temporary.  CSW assisted her in identifying strengths, which she was  able to.  She is pleased with infant's progress thus far.   CSW provided education regarding PPD signs and symptoms to watch for and asked that MOB commit to talking with CSW and or her doctor if symptoms arise at any time; she agreed.  CSW also discussed common emotions often experienced during the first two weeks after delivery, keeping in mind the separation that is inevitably caused by baby's admission to NICU.  MOB denies acknowledges a hx of anxiety/depression, and reported she was dx "A little over a year ago." Per MOB, she prescribed some medication (name unknown) and she declined taking it. CSW acknowledged MOB's current emotional state and suggested outpatient counseling; MOB was accepting and a referral was made to [place]. MOB presented with insight and awareness and denied SI, HI, and DV when assessed for  safety.  MOB states she has a good support system and names FOB, her mother, and best friend as main support people.  MOB reports having all needed baby supplies at home and has chosen a pediatrician.  She states acknowledged issues with transportation and CSW provided MOB with information to apply for Medicaid Transportation; MOB agreed to make contact.   MOB asked CSW who she could speak to regarding her concerns related to medical treatment. CSW provided MOB with contact information for Patient Experience; MOB expressed appreciation.   She states no further questions, concerns or needs at this time.  CSW explained ongoing support services offered by NICU CSW and provided contact information.  MOB seemed very appreciative of the visit and thanked CSW.  CSW Plan/Description:  Psychosocial Support and Ongoing Assessment of Needs, Sudden Infant Death Syndrome (SIDS) Education, Perinatal Mood and Anxiety Disorder (PMADs) Education, Other Patient/Family Education, Other Information/Referral to [employer], MSW, [employer] Clinical Social Work [phone]  Dimple Nanas, LCSW 07/03/2020, 12:04 PM

## 2020-07-04 ENCOUNTER — Other Ambulatory Visit (HOSPITAL_COMMUNITY): Payer: Self-pay | Admitting: Family Medicine

## 2020-07-04 DIAGNOSIS — Z90710 Acquired absence of both cervix and uterus: Secondary | ICD-10-CM | POA: Diagnosis not present

## 2020-07-04 LAB — CBC WITH DIFFERENTIAL/PLATELET
Abs Immature Granulocytes: 0.41 10*3/uL — ABNORMAL HIGH (ref 0.00–0.07)
Basophils Absolute: 0 10*3/uL (ref 0.0–0.1)
Basophils Relative: 0 %
Eosinophils Absolute: 0 10*3/uL (ref 0.0–0.5)
Eosinophils Relative: 0 %
HCT: 21.4 % — ABNORMAL LOW (ref 36.0–46.0)
Hemoglobin: 7.1 g/dL — ABNORMAL LOW (ref 12.0–15.0)
Immature Granulocytes: 3 %
Lymphocytes Relative: 15 %
Lymphs Abs: 2.2 10*3/uL (ref 0.7–4.0)
MCH: 31.4 pg (ref 26.0–34.0)
MCHC: 33.2 g/dL (ref 30.0–36.0)
MCV: 94.7 fL (ref 80.0–100.0)
Monocytes Absolute: 1.4 10*3/uL — ABNORMAL HIGH (ref 0.1–1.0)
Monocytes Relative: 10 %
Neutro Abs: 10.4 10*3/uL — ABNORMAL HIGH (ref 1.7–7.7)
Neutrophils Relative %: 72 %
Platelets: 92 10*3/uL — ABNORMAL LOW (ref 150–400)
RBC: 2.26 MIL/uL — ABNORMAL LOW (ref 3.87–5.11)
RDW: 16.8 % — ABNORMAL HIGH (ref 11.5–15.5)
WBC: 14.5 10*3/uL — ABNORMAL HIGH (ref 4.0–10.5)
nRBC: 0.6 % — ABNORMAL HIGH (ref 0.0–0.2)

## 2020-07-04 LAB — GLUCOSE, CAPILLARY
Glucose-Capillary: 69 mg/dL — ABNORMAL LOW (ref 70–99)
Glucose-Capillary: 91 mg/dL (ref 70–99)

## 2020-07-04 MED ORDER — NIFEDIPINE ER 60 MG PO TB24
60.0000 mg | ORAL_TABLET | Freq: Two times a day (BID) | ORAL | 2 refills | Status: DC
Start: 2020-07-04 — End: 2020-09-28

## 2020-07-04 MED ORDER — LISINOPRIL 10 MG PO TABS
10.0000 mg | ORAL_TABLET | Freq: Every day | ORAL | 2 refills | Status: DC
Start: 2020-07-04 — End: 2021-03-25

## 2020-07-04 MED ORDER — NIFEDIPINE ER OSMOTIC RELEASE 30 MG PO TB24
60.0000 mg | ORAL_TABLET | Freq: Two times a day (BID) | ORAL | Status: DC
  Administered 2020-07-04: 60 mg via ORAL
  Filled 2020-07-04: qty 2

## 2020-07-04 MED ORDER — LISINOPRIL 10 MG PO TABS
10.0000 mg | ORAL_TABLET | Freq: Every day | ORAL | Status: DC
  Administered 2020-07-04: 10 mg via ORAL
  Filled 2020-07-04: qty 1

## 2020-07-04 MED ORDER — OXYCODONE-ACETAMINOPHEN 5-325 MG PO TABS
1.0000 | ORAL_TABLET | Freq: Four times a day (QID) | ORAL | 0 refills | Status: DC | PRN
Start: 2020-07-04 — End: 2020-08-13

## 2020-07-04 MED ORDER — NIFEDIPINE 10 MG PO CAPS
10.0000 mg | ORAL_CAPSULE | Freq: Once | ORAL | Status: AC
  Administered 2020-07-04: 10 mg via ORAL
  Filled 2020-07-04: qty 1

## 2020-07-04 MED FILL — LISINOPRIL 10 MG TABS: 10 | 30 days supply | Qty: 30 | Fill #0

## 2020-07-04 MED FILL — OXYCODONE-APAP 5-325MG: 5-325 | 3 days supply | Qty: 20 | Fill #0

## 2020-07-04 MED FILL — NIFEdipine ER 60 MG TB24: 60 | 30 days supply | Qty: 60 | Fill #0

## 2020-07-04 NOTE — Plan of Care (Signed)
Patient will be discharged home with printed instructions. No concerns noted. Carmelina Dane, RN

## 2020-07-04 NOTE — Discharge Instructions (Signed)
Abdominal Hysterectomy, Care After This sheet gives you information about how to care for yourself after your procedure. Your doctor may also give you more specific instructions. If you have problems or questions, contact your doctor. Follow these instructions at home: Bathing  Do not take baths, swim, or use a hot tub until your doctor says it is okay. Ask your doctor if you can take showers. You may only be allowed to take sponge baths for bathing.  Keep the bandage (dressing) dry until your doctor says it can be taken off. Surgical cut (incision) care      Follow instructions from your doctor about how to take care of your cut from surgery. Make sure you: ? Wash your hands with soap and water before you change your bandage (dressing). If you cannot use soap and water, use hand sanitizer. ? Change your bandage as told by your doctor. ? Leave stitches (sutures), skin glue, or skin tape (adhesive) strips in place. They may need to stay in place for 2 weeks or longer. If tape strips get loose and curl up, you may trim the loose edges. Do not remove tape strips completely unless your doctor says it is okay.  Check your surgical cut area every day for signs of infection. Check for: ? Redness, swelling, or pain. ? Fluid or blood. ? Warmth. ? Pus or a bad smell. Activity  Do gentle, daily exercise as told by your doctor. You may be told to take short walks every day and go farther each time.  Do not lift anything that is heavier than 10 lb (4.5 kg), or the limit that your doctor tells you, until he or she says that it is safe.  Do not drive or use heavy machinery while taking prescription pain medicine.  Do not drive for 24 hours if you were given a medicine to help you relax (sedative).  Follow your doctor's advice about exercise, driving, and general activities. Ask your doctor what activities are safe for you. Lifestyle   Do not douche, use tampons, or have sex for at least 6 weeks  or as told by your doctor.  Do not drink alcohol until your doctor says it is okay.  Drink enough fluid to keep your pee (urine) clear or pale yellow.  Try to have someone at home with you for the first 1-2 weeks to help.  Do not use any products that contain nicotine or tobacco, such as cigarettes and e-cigarettes. These can slow down healing. If you need help quitting, ask your doctor. General instructions  Take over-the-counter and prescription medicines only as told by your doctor.  Do not take aspirin or ibuprofen. These medicines can cause bleeding.  To prevent or treat constipation while you are taking prescription pain medicine, your doctor may suggest that you: ? Drink enough fluid to keep your urine clear or pale yellow. ? Take over-the-counter or prescription medicines. ? Eat foods that are high in fiber, such as:  Fresh fruits and vegetables.  Whole grains.  Beans. ? Limit foods that are high in fat and processed sugars, such as fried and sweet foods.  Keep all follow-up visits as told by your doctor. This is important. Contact a doctor if:  You have chills or fever.  You have redness, swelling, or pain around your cut.  You have fluid or blood coming from your cut.  Your cut feels warm to the touch.  You have pus or a bad smell coming from your cut.    Your cut breaks open.  You feel dizzy or light-headed.  You have pain or bleeding when you pee.  You keep having watery poop (diarrhea).  You keep feeling sick to your stomach (nauseous) or keep throwing up (vomiting).  You have unusual fluid (discharge) coming from your vagina.  You have a rash.  You have a reaction to your medicine.  Your pain medicine does not help. Get help right away if:  You have a fever and your symptoms get worse all of a sudden.  You have very bad belly (abdominal) pain.  You are short of breath.  You pass out (faint).  You have pain, swelling, or redness of your  leg.  You bleed a lot from your vagina and notice clumps of blood (clots). Summary  Do not take baths, swim, or use a hot tub until your doctor says it is okay. Ask your doctor if you can take showers. You may only be allowed to take sponge baths for bathing.  Follow your doctor's advice about exercise, driving, and general activities. Ask your doctor what activities are safe for you.  Do not lift anything that is heavier than 10 lb (4.5 kg), or the limit that your doctor tells you, until he or she says that it is safe.  Try to have someone at home with you for the first 1-2 weeks to help. This information is not intended to replace advice given to you by your health care provider. Make sure you discuss any questions you have with your health care provider. Document Revised: 09/21/2018 Document Reviewed: 07/07/2016 Elsevier Patient Education  2020 [employer].  

## 2020-07-04 NOTE — Discharge Summary (Signed)
Postpartum Discharge Summary  Date of Service updated 07/04/2020     Patient Name: Terri Bradford DOB: 19-Mar-1981 MRN: [id number]  Date of admission: 06/29/2020 Delivery date:06/30/2020  Delivering provider: Aletha Halim  Date of discharge: 07/04/2020  Admitting diagnosis: Thrombocytopenia ([place]) [D69.6] Intrauterine pregnancy: [redacted]w[redacted]d     Secondary diagnosis:  Active Problems:   Thrombocytopenia ([place])   S/P emergency cesarean hysterectomy  Additional problems: anemia    Discharge diagnosis: Term Pregnancy Delivered, CHTN, Type 2 DM, Anemia and PPH                                              Post partum procedures:blood transfusion Augmentation: AROM, Pitocin, Cytotec and IP Foley Complications: [id number]  Hospital course: Induction of Labor With Cesarean Section   39 y.o. yo G4P4003 at [redacted]w[redacted]d was admitted to the hospital 06/29/2020 for induction of labor. Patient had a labor course significant for failure to progress and fetal intolerance. The patient went for cesarean section due to Arrest of Dilation and Non-Reassuring FHR. 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 Pelvic adhesive disease. Postpartum hemorrhage   PROCEDURE: Stat repeat low transverse skin incision via Joel-Cohen incision. Conversion to supracervical hysterectomy, left salpingo-oophrectomy, cystoscopy  SURGEON: Surgeon(s) and Role:    * Aletha Halim, MD - Primary  ASSISTANT:    * Eure, Mertie Clause, MD - Assisting  An  experienced assistant was required given the standard of surgical care given the complexity of the case. This assistant was needed for exposure, dissection, suctioning, retraction, instrument exchange, and for overall help during the procedure   ANESTHESIA: general  ESTIMATED BLOOD LOSS: 4036mL  DRAINS: 511mL UOP via indwelling foley  TOTAL IV FLUIDS: 2622mL, which includes two units of albulmin  VTE PROPHYLAXIS: SCDs to bilateral lower extremities  ANTIBIOTICS: Ancef 2gm and azithromycin $RemoveBeforeDE'500mg'[id number]$  IV were given intra-operatively and re-dosed during the surgery  SPECIMENS: placenta. Uterus. Left tube and ovary  COMPLICATIONS: left uterine extension into the uterine vessels with inability to control bleeding without hysterectomy.      Patient had a postpartum course complicated by anemia and elevated BP. She was discharged on 2 BP meds She is ambulating, tolerating a regular diet, passing flatus, and urinating well.  Patient is discharged home in stable condition on 07/04/20.      Newborn Data: Birth date:06/30/2020  Birth time:7:39 PM  Gender:Female  Living status:Living  Apgars:2 ,5  Weight:2400 g                                 Magnesium Sulfate received: Yes: Seizure prophylaxis BMZ received: No Rhophylac:No MMR:No T-DaP:Given prenatally Flu: Yes Transfusion:Yes  Physical exam  Vitals:   07/04/20 0549 07/04/20 1202 07/04/20 1217 07/04/20 1330  BP: (!) 151/78 (!) 165/88 (!) 156/82 (!) 151/59  Pulse: 69 73 84 77  Resp: 18 (!) 99    Temp: 98.1 F (36.7 C) 98.4 F (36.9 C)  TempSrc: Oral Oral    SpO2: 100% 99%    Weight:      Height:       General: alert, cooperative and no distress Lochia: appropriate Uterine Fundus: n/a Incision: Healing well with no significant drainage DVT Evaluation: No evidence of DVT seen on physical exam. Labs: Lab Results  Component Value Date   WBC 14.5 (H) 07/04/2020   HGB 7.1 (L) 07/04/2020   HCT 21.4 (L) 07/04/2020    MCV 94.7 07/04/2020   PLT 92 (L) 07/04/2020   CMP Latest Ref Rng & Units 07/02/2020  Glucose 70 - 99 mg/dL 139(H)  BUN 6 - 20 mg/dL 6  Creatinine 0.44 - 1.00 mg/dL 0.75  Sodium 135 - 145 mmol/L 137  Potassium 3.5 - 5.1 mmol/L 3.6  Chloride 98 - 111 mmol/L 106  CO2 22 - 32 mmol/L 24  Calcium 8.9 - 10.3 mg/dL 7.5(L)  Total Protein 6.5 - 8.1 g/dL 4.0(L)  Total Bilirubin 0.3 - 1.2 mg/dL 1.1  Alkaline Phos 38 - 126 U/L 51  AST 15 - 41 U/L 30  ALT 0 - 44 U/L 15   Edinburgh Score: No flowsheet data found.   After visit meds:  Allergies as of 07/04/2020   No Known Allergies     Medication List    STOP taking these medications   Dexamethasone 20 MG Tabs   labetalol 200 MG tablet Commonly known as: NORMODYNE     TAKE these medications   aspirin EC 81 MG tablet Take 1 tablet (81 mg total) by mouth daily. Take after 12 weeks for prevention of preeclampsia later in pregnancy   Blood Pressure Kit Devi 1 kit by Does not apply route once a week. Check Blood Pressure regularly and record readings into the Bab[employer] App.  Large Cuff.  DX O90.0   lisinopril 10 MG tablet Commonly known as: ZESTRIL Take 1 tablet (10 mg total) by mouth daily.   metFORMIN 500 MG tablet Commonly known as: GLUCOPHAGE Take 1 tablet (500 mg total) by mouth 2 (two) times daily with a meal.   NIFEdipine 60 MG 24 hr tablet Commonly known as: ADALAT CC Take 1 tablet (60 mg total) by mouth 2 (two) times daily.   oxyCODONE-acetaminophen 5-325 MG tablet Commonly known as: PERCOCET/ROXICET Take 1-2 tablets by mouth every 6 (six) hours as needed for severe pain.   PrePLUS 27-1 MG Tabs Take 1 tablet by mouth daily.        Discharge home in stable condition Infant Feeding: Breast Infant Disposition:NICU Discharge instruction: per After Visit Summary and Postpartum booklet. Activity: Advance as tolerated. Pelvic rest for 6 weeks.  Diet: carb modified diet Future Appointments:No future  appointments. Follow up Visit:  [place] Follow up.   Specialty: Obstetrics and Gynecology Why: wound and BP Contact information: [address], [place] [phone]               Please schedule this patient for a In person postpartum visit in 1 week with the following provider: MD. Additional Postpartum F/U:Incision check 1 week and BP check 1 week  High risk pregnancy complicated by: GDM, HTN and thrombocytopenia Delivery mode:  C-Section, Low Transverse  Anticipated Birth Control:  s/p [place] hysterectomy   07/04/2020 Emeterio Reeve, MD

## 2020-07-04 NOTE — Progress Notes (Signed)
Subjective: Postpartum Day 4: Cesarean Delivery with subsequent cesarean hysterectomy, massive blood transfusion. Patient reports incisional pain, tolerating PO and no problems voiding.    Objective: Vital signs in last 24 hours: Temp:  [98.1 F (36.7 C)-98.4 F (36.9 C)] 98.1 F (36.7 C) (12/03 0549) Pulse Rate:  [69-90] 69 (12/03 0549) Resp:  [17-20] 18 (12/03 0549) BP: (140-176)/(78-94) 151/78 (12/03 0549) SpO2:  [99 %-100 %] 100 % (12/03 0549)  Physical Exam:  General: alert, cooperative and appears stated age Lochia: Minimal Incision: Dressing is clean and dry DVT Evaluation: No evidence of DVT seen on physical exam.  Recent Labs    07/03/20 0524 07/04/20 0558  HGB 7.1* 7.1*  HCT 21.3* 21.4*    Assessment/Plan: Status post Cesarean section. Doing well postoperatively.  Hemoglobin has been stable at 7.1 and she is without symptoms. Markedly elevated blood pressures.  Have begun Procardia and lisinopril given her diabetes.  Meds to beds ordered. If blood pressures become improved control today may consider discharge later today versus tomorrow morning. CBGs have been normal postpartum.  She may resume her Metformin as needed if she goes home. Continue current care.  Reva Bores 07/04/2020, 8:34 AM

## 2020-07-04 NOTE — Progress Notes (Signed)
RN notified about CBG. Grape juice given while patient waits on food to arrive.

## 2020-07-07 ENCOUNTER — Ambulatory Visit: Payer: Medicaid Other

## 2020-07-08 ENCOUNTER — Other Ambulatory Visit: Payer: Self-pay

## 2020-07-08 ENCOUNTER — Ambulatory Visit (INDEPENDENT_AMBULATORY_CARE_PROVIDER_SITE_OTHER): Payer: Medicaid Other

## 2020-07-08 VITALS — BP 147/93 | HR 106 | Wt 137.0 lb

## 2020-07-08 DIAGNOSIS — Z4889 Encounter for other specified surgical aftercare: Secondary | ICD-10-CM

## 2020-07-08 NOTE — Progress Notes (Signed)
Subjective:     Terri Bradford is a 39 y.o. female who presents to the clinic 1 weeks status post emergency cesarean hysterectomy for incision check. Eating a regular diet without difficulty. Bowel movements are normal. Pain is controlled with current analgesics. Medications being used: oxyCODONE-acetaminophen.  Review of Systems Pertinent items are noted in HPI.    Objective:    BP (!) 147/93   Pulse (!) 106   Wt 137 lb (62.1 kg)   LMP 10/13/2019   Breastfeeding No   BMI 25.06 kg/m  General:  alert  Abdomen: soft, bowel sounds active, non-tender  Incision:   healing well, no drainage, no erythema, no hernia, no seroma, no swelling, no dehiscence, incision well approximated     Assessment:    Doing well postoperatively.   Plan:    1. Continue any current medications. 2. Wound care discussed. 3. Activity restrictions: none 4. Anticipated return to work: not applicable. 5. Follow up: 1 week for Incision Check, patient requested to speak with a Provider about her delivery.   Maretta Bees, RMA

## 2020-07-15 ENCOUNTER — Other Ambulatory Visit: Payer: Self-pay

## 2020-07-15 ENCOUNTER — Encounter: Payer: Self-pay | Admitting: Obstetrics

## 2020-07-15 ENCOUNTER — Ambulatory Visit: Payer: Medicaid Other

## 2020-07-15 ENCOUNTER — Ambulatory Visit (INDEPENDENT_AMBULATORY_CARE_PROVIDER_SITE_OTHER): Payer: Medicaid Other | Admitting: Obstetrics

## 2020-07-15 VITALS — BP 121/82 | HR 97 | Ht 63.0 in | Wt 132.4 lb

## 2020-07-15 DIAGNOSIS — G8918 Other acute postprocedural pain: Secondary | ICD-10-CM

## 2020-07-15 DIAGNOSIS — D62 Acute posthemorrhagic anemia: Secondary | ICD-10-CM

## 2020-07-15 DIAGNOSIS — O99355 Diseases of the nervous system complicating the puerperium: Secondary | ICD-10-CM

## 2020-07-15 DIAGNOSIS — D696 Thrombocytopenia, unspecified: Secondary | ICD-10-CM

## 2020-07-15 DIAGNOSIS — O9913 Other diseases of the blood and blood-forming organs and certain disorders involving the immune mechanism complicating the puerperium: Secondary | ICD-10-CM

## 2020-07-15 DIAGNOSIS — Z90710 Acquired absence of both cervix and uterus: Secondary | ICD-10-CM

## 2020-07-15 DIAGNOSIS — O9903 Anemia complicating the puerperium: Secondary | ICD-10-CM

## 2020-07-15 MED ORDER — OXYCODONE-ACETAMINOPHEN 5-325 MG PO TABS: 1.0000 | ORAL_TABLET | Freq: Four times a day (QID) | ORAL | 0 refills | Status: DC | PRN

## 2020-07-15 NOTE — Progress Notes (Signed)
Subjective:     Terri Bradford is a 39 y.o. female who presents for a postpartum visit. She is 2 weeks postpartum following a low cervical transverse Cesarean section with conversion to supracervical hysterectomy, left salpingo-oophorectomy, cystoscopy due to uncontrollable intraoperative hemorrhage. I have fully reviewed the prenatal and intrapartum course. The delivery was at 37 gestational weeks.  Postpartum course has been complicated by severe anemia, otherwise normal after transfusion of blood products. Baby's course has been NICU. Baby is feeding by bottle. Bleeding thin lochia. Bowel function is normal. Bladder function is normal. Patient is not sexually active. Contraception method is status post hysterectomy. Postpartum depression screening: negative.  Tobacco, alcohol and substance abuse history reviewed.  Adult immunizations reviewed including TDAP, rubella and varicella.  The following portions of the patient's history were reviewed and updated as appropriate: allergies, current medications, past family history, past medical history, past social history, past surgical history and problem list.  Review of Systems A comprehensive review of systems was negative.   Objective:    BP 121/82   Pulse 97   Ht 5\' 3"  (1.6 m)   Wt 132 lb 6.4 oz (60.1 kg)   LMP 10/13/2019   Breastfeeding No   BMI 23.45 kg/m   General:  alert and no distress   Breasts:  inspection negative, no nipple discharge or bleeding, no masses or nodularity palpable  Lungs: clear to auscultation bilaterally  Heart:  regular rate and rhythm, S1, S2 normal, no murmur, click, rub or gallop  Abdomen: soft, non-tender; bowel sounds normal; no masses,  no organomegaly and incision is clean, dry and intact with staples.  Staples were removed and steri-strips applied in routine fashion.     50% of 20 min visit spent on counseling and coordination of care.  Assessment:     1. S/P emergency cesarean hysterectomy - doing  well  2. Postoperative anemia due to acute blood loss Rx: - CBC  3. Thrombocytopenia ([facility]) Rx: - CBC  4. Postoperative pain Rx: - oxyCODONE-acetaminophen (PERCOCET/ROXICET) 5-325 MG tablet; Take 1-2 tablets by mouth every 6 (six) hours as needed for severe pain.  Dispense: 20 tablet; Refill: 0   Plan:    1. Contraception: status post hysterectomy 2. Continue iron / vitamins 3. Follow up in: 2 weeks or as needed.   Healthy lifestyle practices reviewed  10/15/2019, MD 07/15/2020  5:00 PM

## 2020-07-15 NOTE — Progress Notes (Signed)
Patient presents for incision check and staple removal. Patient is concerned about her surgery. She expresses that no one has discusses her delivery/surgery with her.  PPD score: 7

## 2020-07-16 LAB — CBC
Hematocrit: 32.6 % — ABNORMAL LOW (ref 34.0–46.6)
Hemoglobin: 10.9 g/dL — ABNORMAL LOW (ref 11.1–15.9)
MCH: 30.6 pg (ref 26.6–33.0)
MCHC: 33.4 g/dL (ref 31.5–35.7)
MCV: 92 fL (ref 79–97)
Platelets: 367 10*3/uL (ref 150–450)
RBC: 3.56 x10E6/uL — ABNORMAL LOW (ref 3.77–5.28)
RDW: 14.5 % (ref 11.7–15.4)
WBC: 10.3 10*3/uL (ref 3.4–10.8)

## 2020-07-17 ENCOUNTER — Other Ambulatory Visit: Payer: Self-pay | Admitting: Obstetrics

## 2020-07-17 DIAGNOSIS — D508 Other iron deficiency anemias: Secondary | ICD-10-CM

## 2020-07-17 MED ORDER — FERROUS SULFATE 325 (65 FE) MG PO TABS: 325.0000 mg | ORAL_TABLET | ORAL | 5 refills | Status: DC

## 2020-07-18 ENCOUNTER — Telehealth: Payer: Self-pay

## 2020-07-18 NOTE — Telephone Encounter (Signed)
Advised of results and rx 

## 2020-07-21 ENCOUNTER — Telehealth: Payer: Self-pay | Admitting: *Deleted

## 2020-07-21 NOTE — Telephone Encounter (Signed)
Pt called to office asking what laxative she can use, she cannot "use the bathroom".  LVM making pt aware she may take OTC stool softener or Miralax, increase fluids and fiber. Advised to call if any other questions.

## 2020-07-29 ENCOUNTER — Ambulatory Visit: Payer: Medicaid Other | Admitting: Obstetrics and Gynecology

## 2020-07-29 ENCOUNTER — Telehealth: Payer: Self-pay

## 2020-07-29 NOTE — Telephone Encounter (Signed)
Received message from baby love nurse. Patient had a home visit and score 10 on PhQ-9  Form. She will go ahead and refer her to [facility] service. Patient does not have any thoughts of hurting herself or others. She has f/up here in the office 12/30

## 2020-07-31 ENCOUNTER — Ambulatory Visit: Payer: Medicaid Other | Admitting: Obstetrics & Gynecology

## 2020-08-12 ENCOUNTER — Ambulatory Visit: Payer: Medicaid Other | Admitting: Obstetrics

## 2020-08-13 ENCOUNTER — Other Ambulatory Visit: Payer: Self-pay

## 2020-08-13 ENCOUNTER — Encounter: Payer: Self-pay | Admitting: Obstetrics

## 2020-08-13 ENCOUNTER — Ambulatory Visit (INDEPENDENT_AMBULATORY_CARE_PROVIDER_SITE_OTHER): Payer: Medicaid Other | Admitting: Obstetrics

## 2020-08-13 VITALS — BP 128/86 | HR 69 | Ht 62.0 in | Wt 129.3 lb

## 2020-08-13 DIAGNOSIS — R52 Pain, unspecified: Secondary | ICD-10-CM | POA: Diagnosis not present

## 2020-08-13 DIAGNOSIS — Z1239 Encounter for other screening for malignant neoplasm of breast: Secondary | ICD-10-CM

## 2020-08-13 DIAGNOSIS — Z90710 Acquired absence of both cervix and uterus: Secondary | ICD-10-CM | POA: Diagnosis not present

## 2020-08-13 MED ORDER — IBUPROFEN 800 MG PO TABS: 800.0000 mg | ORAL_TABLET | Freq: Three times a day (TID) | ORAL | 5 refills | Status: DC | PRN

## 2020-08-13 NOTE — Progress Notes (Signed)
Post Partum Visit Note  Terri Bradford is a 40 y.o. G87P4003 female who presents for a postpartum visit. She is 6 weeks postpartum following an emergency cesarean supracervical hysterectomy / left salpingo-oophorectomy for uncontrollable intraoperative hemorrhage.  I have fully reviewed the prenatal and intrapartum course. The delivery was at 37 gestational weeks.  Anesthesia: spinal. Postpartum course has been uncomplicated. Baby is doing well. Baby is feeding by bottle - Similac Neosure. Bleeding no bleeding. Bowel function is normal. Bladder function is normal. Patient is not sexually active. Contraception method is status post hysterectomy. Postpartum depression screening: negative.    Edinburgh Postnatal Depression Scale - 08/13/20 1324      Edinburgh Postnatal Depression Scale:  In the Past 7 Days   I have been able to laugh and see the funny side of things. 1    I have looked forward with enjoyment to things. 0    I have blamed myself unnecessarily when things went wrong. 0    I have been anxious or worried for no good reason. 0    I have felt scared or panicky for no good reason. 0    Things have been getting on top of me. 0    I have been so unhappy that I have had difficulty sleeping. 0    I have felt sad or miserable. 1    I have been so unhappy that I have been crying. 1    The thought of harming myself has occurred to me. 0    Edinburgh Postnatal Depression Scale Total 3          The following portions of the patient's history were reviewed and updated as appropriate: allergies, current medications, past family history, past medical history, past social history, past surgical history and problem list.  Review of Systems A comprehensive review of systems was negative.    Objective:  BP 128/86   Pulse 69   Ht 5\' 2"  (1.575 m)   Wt 129 lb 4.8 oz (58.7 kg)   LMP 10/13/2019   Breastfeeding No   BMI 23.65 kg/m    General:  alert and no distress   Breasts:  inspection  negative, no nipple discharge or bleeding, no masses or nodularity palpable  Lungs: clear to auscultation bilaterally  Heart:  regular rate and rhythm, S1, S2 normal, no murmur, click, rub or gallop  Abdomen: soft, non-tender; bowel sounds normal; no masses,  no organomegaly.  Incision is clean, dry, intact and non tender    Assessment:    1. S/P emergency cesarean supracervical hysterectomy / left salpingo-oophorectomy for uncontrollable intraoperative hemorrhage - doing well  2. Pain, appropriate post-op Rx: - ibuprofen (ADVIL) 800 MG tablet; Take 1 tablet (800 mg total) by mouth every 8 (eight) hours as needed.  Dispense: 30 tablet; Refill: 5   Plan:   Essential components of care per ACOG recommendations:  1.  Mood and well being: Patient with negative depression screening today. Reviewed local resources for support.  - Patient does use tobacco. If using tobacco we discussed reduction and for recently cessation risk of relapse - hx of drug use? No   2. Infant care and feeding:  -Patient currently breastmilk feeding? No  -Social determinants of health (SDOH) reviewed in EPIC. No concerns  3. Sexuality, contraception and birth spacing - Patient does not want a pregnancy in the next year.  Desired family size is 3 children.   4. Sleep and fatigue -Encouraged family/partner/community support of 4  hrs of uninterrupted sleep to help with mood and fatigue  5. Physical Recovery  - Discussed patients delivery and complications - Postoperative healing after a hysterectomy discussed - Patient has urinary incontinence? No - Patient is not safe to resume physical and sexual activity  6.  Health Maintenance - Last pap smear done 01-03-2020 and was normal with negative HPV. - Needs Mammogram  7. Chronic Disease:  HTN - PCP follow up  Coral Ceo, MD [facility], [facility] 08/13/20

## 2020-09-01 ENCOUNTER — Ambulatory Visit: Payer: Medicaid Other

## 2020-09-04 ENCOUNTER — Other Ambulatory Visit: Payer: Self-pay | Admitting: Family Medicine

## 2020-09-16 ENCOUNTER — Other Ambulatory Visit: Payer: Self-pay

## 2020-09-16 ENCOUNTER — Emergency Department (HOSPITAL_COMMUNITY)
Admission: EM | Admit: 2020-09-16 | Discharge: 2020-09-17 | Disposition: A | Discharge status: Home / Self Care | Payer: Medicaid Other | Attending: Emergency Medicine | Admitting: Emergency Medicine

## 2020-09-16 ENCOUNTER — Encounter (HOSPITAL_COMMUNITY): Payer: Self-pay

## 2020-09-16 DIAGNOSIS — Z7984 Long term (current) use of oral hypoglycemic drugs: Secondary | ICD-10-CM | POA: Insufficient documentation

## 2020-09-16 DIAGNOSIS — F17210 Nicotine dependence, cigarettes, uncomplicated: Secondary | ICD-10-CM | POA: Diagnosis not present

## 2020-09-16 DIAGNOSIS — R5383 Other fatigue: Secondary | ICD-10-CM | POA: Diagnosis not present

## 2020-09-16 DIAGNOSIS — Z79899 Other long term (current) drug therapy: Secondary | ICD-10-CM | POA: Diagnosis not present

## 2020-09-16 DIAGNOSIS — I10 Essential (primary) hypertension: Secondary | ICD-10-CM | POA: Insufficient documentation

## 2020-09-16 DIAGNOSIS — E119 Type 2 diabetes mellitus without complications: Secondary | ICD-10-CM | POA: Insufficient documentation

## 2020-09-16 LAB — CBC
HCT: 40.4 % (ref 36.0–46.0)
Hemoglobin: 13.3 g/dL (ref 12.0–15.0)
MCH: 29.3 pg (ref 26.0–34.0)
MCHC: 32.9 g/dL (ref 30.0–36.0)
MCV: 89 fL (ref 80.0–100.0)
Platelets: 141 10*3/uL — ABNORMAL LOW (ref 150–400)
RBC: 4.54 MIL/uL (ref 3.87–5.11)
RDW: 18 % — ABNORMAL HIGH (ref 11.5–15.5)
WBC: 6.5 10*3/uL (ref 4.0–10.5)
nRBC: 0 % (ref 0.0–0.2)

## 2020-09-16 LAB — BASIC METABOLIC PANEL
Anion gap: 11 (ref 5–15)
BUN: <5 mg/dL — ABNORMAL LOW (ref 6–20)
CO2: 21 mmol/L — ABNORMAL LOW (ref 22–32)
Calcium: 9 mg/dL (ref 8.9–10.3)
Chloride: 108 mmol/L (ref 98–111)
Creatinine, Ser: 0.82 mg/dL (ref 0.44–1.00)
GFR, Estimated: >60 mL/min (ref >60)
Glucose, Bld: 85 mg/dL (ref 70–99)
Potassium: 3.6 mmol/L (ref 3.5–5.1)
Sodium: 140 mmol/L (ref 135–145)

## 2020-09-16 LAB — URINALYSIS, ROUTINE W REFLEX MICROSCOPIC
Bilirubin Urine: NEGATIVE
Glucose, UA: NEGATIVE mg/dL
Hgb urine dipstick: NEGATIVE
Ketones, ur: NEGATIVE mg/dL
Nitrite: NEGATIVE
Protein, ur: NEGATIVE mg/dL
Specific Gravity, Urine: 1.008 (ref 1.005–1.030)
pH: 5 (ref 5.0–8.0)

## 2020-09-16 NOTE — ED Triage Notes (Signed)
Pt c/o weakness, SHOB, dizziness since November after having baby, states it's gotten worse in the last month. Complaint w all medications at home, unsure if she should go back to pre-pregnancy dosages. States she feels like she's going to pass out when she stands up, but denies true syncopal episodes.  NAD noted in triage

## 2020-09-17 NOTE — ED Provider Notes (Signed)
[place] EMERGENCY DEPARTMENT Provider Note  CSN: [id number] Arrival date & time: 09/16/20 2109  Chief Complaint(s) Weakness, Shortness of Breath, and Dizziness  HPI Terri Bradford is a 40 y.o. female here for several months of generalized fatigue, recurrent panic attacks history, and dizziness.  Reports that this began after her delivery in November.  Reports that it has been gradually worsening since then.  Patient had pregnancy complicated with postpartum hypertension, currently on labetalol.  Patient reports that her diet has not been great since giving birth.  Reports that she has increased stress.  Reports that she saw her primary care provider last week, where they obtained labs and prescribed her an antidepressant which she has not started yet.  She is got a follow-up appointment in 2 weeks.  She denies any recent fevers or infections.  No coughing or congestion.  No other physical complaints.   HPI  Past Medical History Past Medical History:  Diagnosis Date  . Anxiety   . Blood transfusion without reported diagnosis   . Diabetes mellitus without complication ([place])   . Gestational diabetes   . Hypertension   . Pregnancy induced hypertension   . Thrombocytopenia, unspecified ([place])    Thrombocytopenia   Patient Active Problem List   Diagnosis Date Noted  . S/P emergency cesarean hysterectomy 07/04/2020  . Thrombocytopenia ([place]) 01/04/2020  . History of intrauterine fetal death in previous pregnancy 01-22-2020  . Chronic hypertension in pregnancy 01/22/20  . History of cesarean section Jan 22, 2020  . AMA (advanced maternal age) multigravida 35+ 01/22/20  . Supervision of high risk pregnancy, antepartum 12/26/2019  . Type 2 diabetes mellitus in pregnancy, second trimester 12/12/2014   Home Medication(s) Prior to Admission medications   Medication Sig Start Date End Date Taking? Authorizing Provider  Blood Pressure Monitoring (BLOOD PRESSURE KIT)  DEVI 1 kit by Does not apply route once a week. Check Blood Pressure regularly and record readings into the Babys[employer] App.  Large Cuff.  DX O90.0 Patient not taking: Reported on 08/13/2020 12/26/19   Woodroe Mode, MD  ferrous sulfate 325 (65 FE) MG tablet Take 1 tablet (325 mg total) by mouth every other day. Patient not taking: Reported on 08/13/2020 07/17/20   Shelly Bombard, MD  ibuprofen (ADVIL) 800 MG tablet Take 1 tablet (800 mg total) by mouth every 8 (eight) hours as needed. 08/13/20   Shelly Bombard, MD  lisinopril (ZESTRIL) 10 MG tablet Take 1 tablet (10 mg total) by mouth daily. 07/04/20   Donnamae Jude, MD  metFORMIN (GLUCOPHAGE) 500 MG tablet Take 1 tablet (500 mg total) by mouth 2 (two) times daily with a meal. Patient not taking: Reported on 08/13/2020 01/31/20   Chauncey Mann, MD  NIFEdipine (ADALAT CC) 60 MG 24 hr tablet Take 1 tablet (60 mg total) by mouth 2 (two) times daily. 07/04/20   Donnamae Jude, MD  Past Surgical History Past Surgical History:  Procedure Laterality Date  . ABDOMINAL HYSTERECTOMY Bilateral 06/30/2020   Procedure: HYSTERECTOMY ABDOMINAL;  Surgeon: Aletha Halim, MD;  Location: [facility];  Service: Obstetrics;  Laterality: Bilateral;  . CESAREAN SECTION    . CESAREAN SECTION N/A 06/30/2020   Procedure: CESAREAN SECTION;  Surgeon: Aletha Halim, MD;  Location: [facility];  Service: Obstetrics;  Laterality: N/A;  . CYSTOSCOPY N/A 06/30/2020   Procedure: CYSTOSCOPY;  Surgeon: Aletha Halim, MD;  Location: [facility];  Service: Obstetrics;  Laterality: N/A;   Family History Family History  Problem Relation Age of Onset  . Hypertension Mother   . COPD Mother   . Anxiety disorder Sister   . Diabetes Sister     Social History Social History   Tobacco Use  . Smoking status: Current Every Day Smoker     Packs/day: 0.25    Types: Cigarettes  . Smokeless tobacco: Never Used  Vaping Use  . Vaping Use: Never used  Substance Use Topics  . Alcohol use: Yes    Comment: occ  . Drug use: No   Allergies Patient has no known allergies.  Review of Systems Review of Systems All other systems are reviewed and are negative for acute change except as noted in the HPI  Physical Exam Vital Signs  I have reviewed the triage vital signs BP (!) 139/96 (BP Location: Right Arm)   Pulse 61   Temp 98.9 F (37.2 C) (Oral)   Resp 17   LMP 10/13/2019   SpO2 100%   Physical Exam Vitals reviewed.  Constitutional:      General: She is not in acute distress.    Appearance: She is well-developed and well-nourished. She is not diaphoretic.  HENT:     Head: Normocephalic and atraumatic.     Nose: Nose normal.  Eyes:     General: No scleral icterus.       Right eye: No discharge.        Left eye: No discharge.     Extraocular Movements: EOM normal.     Conjunctiva/sclera: Conjunctivae normal.     Pupils: Pupils are equal, round, and reactive to light.  Cardiovascular:     Rate and Rhythm: Normal rate and regular rhythm.     Heart sounds: No murmur heard. No friction rub. No gallop.   Pulmonary:     Effort: Pulmonary effort is normal. No respiratory distress.     Breath sounds: Normal breath sounds. No stridor. No rales.  Abdominal:     General: There is no distension.     Palpations: Abdomen is soft.     Tenderness: There is no abdominal tenderness.  Musculoskeletal:        General: No tenderness or edema.     Cervical back: Normal range of motion and neck supple.     Right lower leg: No edema.     Left lower leg: No edema.  Skin:    General: Skin is warm and dry.     Findings: No erythema or rash.  Neurological:     Mental Status: She is alert and oriented to person, place, and time.  Psychiatric:        Mood and Affect: Mood and affect normal.     ED Results and  Treatments Labs (all labs ordered are listed, but only abnormal results are displayed) Labs Reviewed  BASIC METABOLIC PANEL - Abnormal; Notable for the following components:  Result Value   CO2 21 (*)    BUN <5 (*)    All other components within normal limits  CBC - Abnormal; Notable for the following components:   RDW 18.0 (*)    Platelets 141 (*)    All other components within normal limits  URINALYSIS, ROUTINE W REFLEX MICROSCOPIC - Abnormal; Notable for the following components:   Leukocytes,Ua TRACE (*)    Bacteria, UA RARE (*)    All other components within normal limits                                                                                                                         EKG  EKG Interpretation  Date/Time:    Ventricular Rate:    PR Interval:    QRS Duration:   QT Interval:    QTC Calculation:   R Axis:     Text Interpretation:        Radiology No results found.  Pertinent labs & imaging results that were available during my care of the patient were reviewed by me and considered in my medical decision making (see chart for details).  Medications Ordered in ED Medications - No data to display                                                                                                                                  Procedures Procedures  (including critical care time)  Medical Decision Making / ED Course I have reviewed the nursing notes for this encounter and the patient's prior records (if available in EHR or on provided paperwork).   Terri Bradford was evaluated in Emergency Department on 09/17/2020 for the symptoms described in the history of present illness. She was evaluated in the context of the global COVID-19 pandemic, which necessitated consideration that the patient might be at risk for infection with the SARS-CoV-2 virus that causes COVID-19. Institutional protocols and algorithms that pertain to the evaluation of patients at  risk for COVID-19 are in a state of rapid change based on information released by regulatory bodies including the CDC and federal and state organizations. These policies and algorithms were followed during the patient's care in the ED.  The patient appears well, in no acute distress, without evidence of toxicity or dehydration.  Labs reassuring without anemia.  No significant electrolyte derangements or renal sufficiency.  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  Recommended counseling, cognitive behavioral therapy, and stress relieving exercises.         Final Clinical Impression(s) / ED Diagnoses Final diagnoses:  Other fatigue   The patient appears reasonably screened and/or stabilized for discharge and I doubt any other medical condition or other [facility] requiring further screening, evaluation, or treatment in the ED at this time prior to discharge. Safe for discharge with strict return precautions.  Disposition: Discharge  Condition: Good  I have discussed the results, Dx and Tx plan with the patient/family who expressed understanding and agree(s) with the plan. Discharge instructions discussed at length. The patient/family was given strict return precautions who verbalized understanding of the instructions. No further questions at time of discharge.    ED Discharge Orders    None      Follow Up: Primary care provider  Go to  as scheduled       This chart was dictated using voice recognition software.  Despite best efforts to proofread,  errors can occur which can change the documentation meaning.   Fatima Blank, MD 09/17/20 [phone]

## 2020-09-17 NOTE — ED Notes (Signed)
Pt left prior to this RN providing discharge instructions.

## 2020-09-24 ENCOUNTER — Ambulatory Visit: Payer: Medicaid Other | Admitting: Obstetrics

## 2020-09-28 ENCOUNTER — Other Ambulatory Visit: Payer: Self-pay

## 2020-09-28 ENCOUNTER — Emergency Department (HOSPITAL_COMMUNITY): Payer: Medicaid Other

## 2020-09-28 ENCOUNTER — Emergency Department (HOSPITAL_COMMUNITY)
Admission: EM | Admit: 2020-09-28 | Discharge: 2020-09-28 | Disposition: A | Discharge status: Home / Self Care | Payer: Medicaid Other | Attending: Emergency Medicine | Admitting: Emergency Medicine

## 2020-09-28 DIAGNOSIS — F17210 Nicotine dependence, cigarettes, uncomplicated: Secondary | ICD-10-CM | POA: Diagnosis not present

## 2020-09-28 DIAGNOSIS — R079 Chest pain, unspecified: Secondary | ICD-10-CM | POA: Diagnosis present

## 2020-09-28 DIAGNOSIS — E119 Type 2 diabetes mellitus without complications: Secondary | ICD-10-CM | POA: Diagnosis not present

## 2020-09-28 DIAGNOSIS — Z79899 Other long term (current) drug therapy: Secondary | ICD-10-CM | POA: Diagnosis not present

## 2020-09-28 DIAGNOSIS — R072 Precordial pain: Secondary | ICD-10-CM | POA: Diagnosis not present

## 2020-09-28 DIAGNOSIS — I10 Essential (primary) hypertension: Secondary | ICD-10-CM | POA: Insufficient documentation

## 2020-09-28 DIAGNOSIS — M62838 Other muscle spasm: Secondary | ICD-10-CM | POA: Diagnosis not present

## 2020-09-28 LAB — I-STAT BETA HCG BLOOD, ED (MC, WL, AP ONLY): I-stat hCG, quantitative: <5 m[IU]/mL (ref <5)

## 2020-09-28 LAB — BASIC METABOLIC PANEL
Anion gap: 9 (ref 5–15)
BUN: 5 mg/dL — ABNORMAL LOW (ref 6–20)
CO2: 26 mmol/L (ref 22–32)
Calcium: 9.4 mg/dL (ref 8.9–10.3)
Chloride: 105 mmol/L (ref 98–111)
Creatinine, Ser: 0.82 mg/dL (ref 0.44–1.00)
GFR, Estimated: >60 mL/min (ref >60)
Glucose, Bld: 90 mg/dL (ref 70–99)
Potassium: 3.6 mmol/L (ref 3.5–5.1)
Sodium: 140 mmol/L (ref 135–145)

## 2020-09-28 LAB — CBC
HCT: 44.7 % (ref 36.0–46.0)
Hemoglobin: 14.8 g/dL (ref 12.0–15.0)
MCH: 29.3 pg (ref 26.0–34.0)
MCHC: 33.1 g/dL (ref 30.0–36.0)
MCV: 88.5 fL (ref 80.0–100.0)
Platelets: 144 10*3/uL — ABNORMAL LOW (ref 150–400)
RBC: 5.05 MIL/uL (ref 3.87–5.11)
RDW: 17.8 % — ABNORMAL HIGH (ref 11.5–15.5)
WBC: 8 10*3/uL (ref 4.0–10.5)
nRBC: 0 % (ref 0.0–0.2)

## 2020-09-28 LAB — TROPONIN I (HIGH SENSITIVITY)
Troponin I (High Sensitivity): 6 ng/L (ref <18)
Troponin I (High Sensitivity): 5 ng/L (ref <18)

## 2020-09-28 MED ORDER — CYCLOBENZAPRINE HCL 10 MG PO TABS: 10.0000 mg | ORAL_TABLET | Freq: Two times a day (BID) | ORAL | 0 refills | Status: DC | PRN

## 2020-09-28 MED ORDER — ACETAMINOPHEN 325 MG PO TABS
650.0000 mg | ORAL_TABLET | Freq: Once | ORAL | Status: AC
  Administered 2020-09-28: 650 mg via ORAL
  Filled 2020-09-28: qty 2

## 2020-09-28 NOTE — Discharge Instructions (Addendum)

## 2020-09-28 NOTE — ED Triage Notes (Signed)
Pt said pressures have been running high and tonight chest was hutting in the center. No nausea, no vomiting does not radiate. Pt said she went to pcp and was changing her BP meds

## 2020-09-28 NOTE — ED Provider Notes (Signed)
[place] EMERGENCY DEPARTMENT Provider Note   CSN: [id number] Arrival date & time: 09/28/20  0115     History Chief Complaint  Patient presents with  . Chest Pain  . Hypertension    Terri Bradford is a 40 y.o. female.  HPI  HPI: A 40 year old patient with a history of hypertension presents for evaluation of chest pain. Initial onset of pain was approximately 3-6 hours ago. The patient's chest pain is described as heaviness/pressure/tightness and is not worse with exertion. The patient's chest pain is middle- or left-sided, is not well-localized, is not sharp and does not radiate to the arms/jaw/neck. The patient does not complain of nausea and denies diaphoresis. The patient has smoked in the past 90 days. The patient has no history of stroke, has no history of peripheral artery disease, denies any history of treated diabetes, has no relevant family history of coronary artery disease (first degree relative at less than age 52), has no history of hypercholesterolemia and does not have an elevated BMI (>=30).   Patient reports difficulty controlling her blood pressure ever since giving birth to her child several months ago.  She reports her PCP recently stopped her medicines and put her on lisinopril 20 mg daily.  She just started this in the past day.  She reports her blood pressures are still very high.  She reports over the past 3 hours she has had central chest pain.  No other associated symptoms with it.  Denies shortness of breath, no nausea or vomiting. She denies any pleuritic pain.  She denies any known history of CAD/VTE Patient is a daily smoker and drink alcohol several days a week She also reports anxiety as well as muscle spasms that have been an ongoing problem Past Medical History:  Diagnosis Date  . Anxiety   . Blood transfusion without reported diagnosis   . Diabetes mellitus without complication ([place])   . Gestational diabetes   . Hypertension   .  Pregnancy induced hypertension   . Thrombocytopenia, unspecified ([place])    Thrombocytopenia    Patient Active Problem List   Diagnosis Date Noted  . S/P emergency cesarean hysterectomy 07/04/2020  . Thrombocytopenia ([place]) 01/04/2020  . History of intrauterine fetal death in previous pregnancy Jan 26, 2020  . Chronic hypertension in pregnancy 01-26-2020  . History of cesarean section 01-26-2020  . AMA (advanced maternal age) multigravida 35+ January 26, 2020  . Supervision of high risk pregnancy, antepartum 12/26/2019  . Type 2 diabetes mellitus in pregnancy, second trimester 12/12/2014    Past Surgical History:  Procedure Laterality Date  . ABDOMINAL HYSTERECTOMY Bilateral 06/30/2020   Procedure: HYSTERECTOMY ABDOMINAL;  Surgeon: Aletha Halim, MD;  Location: [facility];  Service: Obstetrics;  Laterality: Bilateral;  . CESAREAN SECTION    . CESAREAN SECTION N/A 06/30/2020   Procedure: CESAREAN SECTION;  Surgeon: Aletha Halim, MD;  Location: [facility];  Service: Obstetrics;  Laterality: N/A;  . CYSTOSCOPY N/A 06/30/2020   Procedure: CYSTOSCOPY;  Surgeon: Aletha Halim, MD;  Location: [facility];  Service: Obstetrics;  Laterality: N/A;     OB History    Gravida  4   Para  4   Term  4   Preterm  0   AB  0   Living  3     SAB  0   IAB  0   Ectopic  0   Multiple  0   Live Births  3  Family History  Problem Relation Age of Onset  . Hypertension Mother   . COPD Mother   . Anxiety disorder Sister   . Diabetes Sister     Social History   Tobacco Use  . Smoking status: Current Every Day Smoker    Packs/day: 0.25    Types: Cigarettes  . Smokeless tobacco: Never Used  Vaping Use  . Vaping Use: Never used  Substance Use Topics  . Alcohol use: Yes    Comment: occ  . Drug use: No    Home Medications Prior to Admission medications   Medication Sig Start Date End Date Taking? Authorizing Provider  Blood Pressure Monitoring (BLOOD PRESSURE KIT)  DEVI 1 kit by Does not apply route once a week. Check Blood Pressure regularly and record readings into the Bab[employer] App.  Large Cuff.  DX O90.0 Patient not taking: Reported on 08/13/2020 12/26/19   Adam Phenix, MD  ferrous sulfate 325 (65 FE) MG tablet Take 1 tablet (325 mg total) by mouth every other day. Patient not taking: Reported on 08/13/2020 07/17/20   Brock Bad, MD  ibuprofen (ADVIL) 800 MG tablet Take 1 tablet (800 mg total) by mouth every 8 (eight) hours as needed. 08/13/20   Brock Bad, MD  lisinopril (ZESTRIL) 10 MG tablet Take 1 tablet (10 mg total) by mouth daily. 07/04/20   Reva Bores, MD  metFORMIN (GLUCOPHAGE) 500 MG tablet Take 1 tablet (500 mg total) by mouth 2 (two) times daily with a meal. Patient not taking: Reported on 08/13/2020 01/31/20 09/28/20  Joselyn Arrow, MD  NIFEdipine (ADALAT CC) 60 MG 24 hr tablet Take 1 tablet (60 mg total) by mouth 2 (two) times daily. 07/04/20 09/28/20  Reva Bores, MD    Allergies    Patient has no known allergies.  Review of Systems   Review of Systems  Constitutional: Negative for diaphoresis and fever.  Respiratory: Negative for shortness of breath.   Cardiovascular: Positive for chest pain.  Gastrointestinal: Negative for vomiting.  Musculoskeletal: Positive for myalgias.  Psychiatric/Behavioral: The patient is nervous/anxious.   All other systems reviewed and are negative.   Physical Exam Updated Vital Signs BP (!) 194/94 (BP Location: Left Arm)   Pulse (!) 52   Temp 98 F (36.7 C)   Resp 17   SpO2 100%   Physical Exam CONSTITUTIONAL: Well developed/well nourished HEAD: Normocephalic/atraumatic EYES: EOMI/PERRL ENMT: mask in place NECK: supple no meningeal signs SPINE/BACK:entire spine nontender CV: S1/S2 noted, no murmurs/rubs/gallops noted LUNGS: Lungs are clear to auscultation bilaterally, no apparent distress ABDOMEN: soft, nontender, no rebound or guarding, bowel sounds noted throughout  abdomen GU:no cva tenderness NEURO: Pt is awake/alert/appropriate, moves all extremitiesx4.  No facial droop.   EXTREMITIES: pulses normal/equalx4, full ROM SKIN: warm, color normal PSYCH: no abnormalities of mood noted, alert and oriented to situation  ED Results / Procedures / Treatments   Labs (all labs ordered are listed, but only abnormal results are displayed) Labs Reviewed  BASIC METABOLIC PANEL - Abnormal; Notable for the following components:      Result Value   BUN 5 (*)    All other components within normal limits  CBC - Abnormal; Notable for the following components:   RDW 17.8 (*)    Platelets 144 (*)    All other components within normal limits  I-STAT BETA HCG BLOOD, ED ([facility], [facility], [facility] ONLY)  TROPONIN I (HIGH SENSITIVITY)  TROPONIN I (HIGH SENSITIVITY)    EKG  EKG Interpretation  Date/Time:  $RemoveBef'Sunday September 28 2020 01:21:51 EST Ventricular Rate:  54 PR Interval:  136 QRS Duration: 62 QT Interval:  484 QTC Calculation: 458 R Axis:   -16 Text Interpretation: Sinus bradycardia Septal infarct , age undetermined Abnormal ECG Interpretation limited secondary to artifact Confirmed by Kierstan Auer ([id number]) on 09/28/2020 3:08:55 AM   Radiology DG Chest 2 View  Result Date: 09/28/2020 CLINICAL DATA:  Chest pain tonight with extension to both shoulders. Some shortness of breath. EXAM: CHEST - 2 VIEW COMPARISON:  08/24/2019 FINDINGS: The heart appears larger than was seen previously but remains within the range of normal. Mediastinal shadows are normal. Pulmonary vascularity is normal. The lungs are clear. No effusions. No significant bone finding. IMPRESSION: No active disease. Heart appears larger than was seen previously but remains within the range of normal. Electronically Signed   By: Mark  Shogry M.D.   On: 09/28/2020 01:57    Procedures Procedures   Medications Ordered in ED Medications  acetaminophen (TYLENOL) tablet 650 mg (650 mg Oral Given 09/28/20 0406)     ED Course  I have reviewed the triage vital signs and the nursing notes.  Pertinent labs & imaging results that were available during my care of the patient were reviewed by me and considered in my medical decision making (see chart for details).    MDM Rules/Calculators/A&P HEAR Score: 2                        Patient presents for 2 issues.  Patient reports that she has had difficult to control blood pressure for several months.  She just got placed on lisinopril 20 mg daily. She also reports chest pain for the past several hours. My suspicion for ACS/PE/dissection is low.  Patient is overall very well-appearing.  She does report struggling with anxiety.  However she is motivated to control her blood pressure.  We had a long discussion about the need to cut back on her alcohol usage as well as quitting smoking. Patient did request meds for muscle spasm.  She reports that her PCP prescribed her a medication but was not covered by Medicare.  This seems to be a reasonable request.  I have prescribed a short course of Flexeril, and advised her about not mixing this with alcohol 4:39 AM Patient is appropriate for discharge home.  Encouraged to continue her medications and cut back on smoking and drinking alcohol.  We discussed return precautions Final Clinical Impression(s) / ED Diagnoses Final diagnoses:  Precordial pain  Muscle spasm    Rx / DC Orders ED Discharge Orders         Ordered    cyclobenzaprine (FLEXERIL) 10 MG tablet  2 times daily PRN        02'[id number]$ /27/22 0413           Ripley Fraise, MD 09/28/20 [phone]

## 2020-10-29 IMAGING — US US MFM UA CORD DOPPLER
1 series · 14 of 28 positions shown · non-contrast
Comparison: none

[Series 1: us mfm ua cord doppler · 41 acquisitions, 14 frames shown]
[im 2/41]
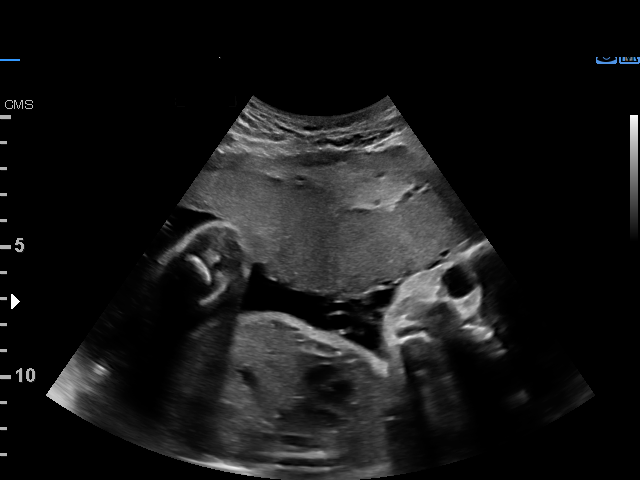
[im 5/41]
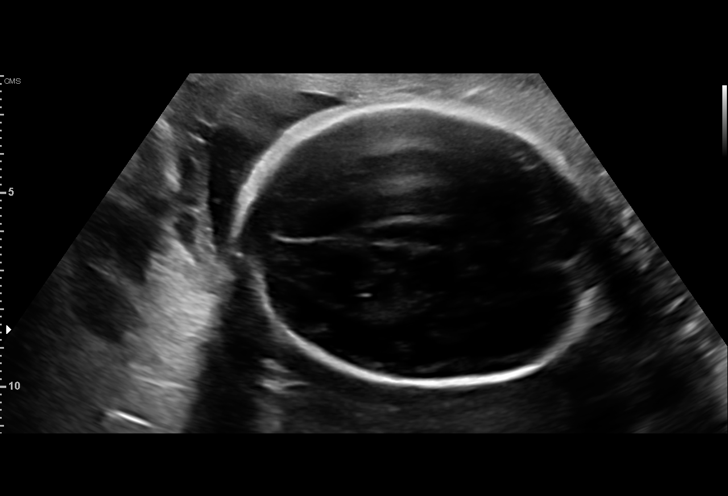
[im 8/41]
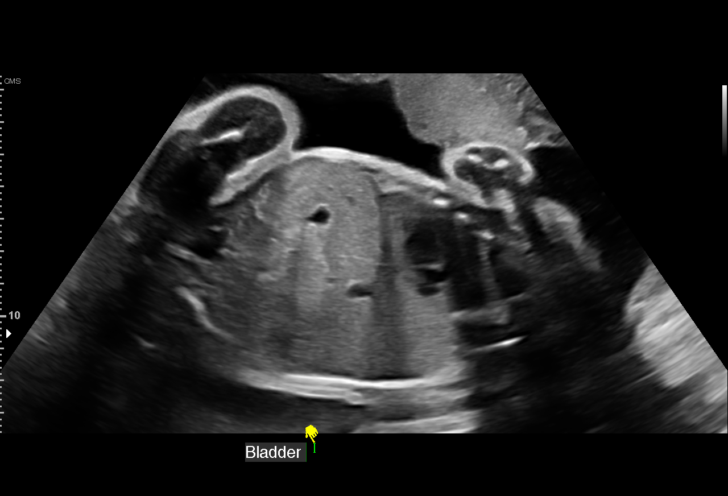
[im 11/41]
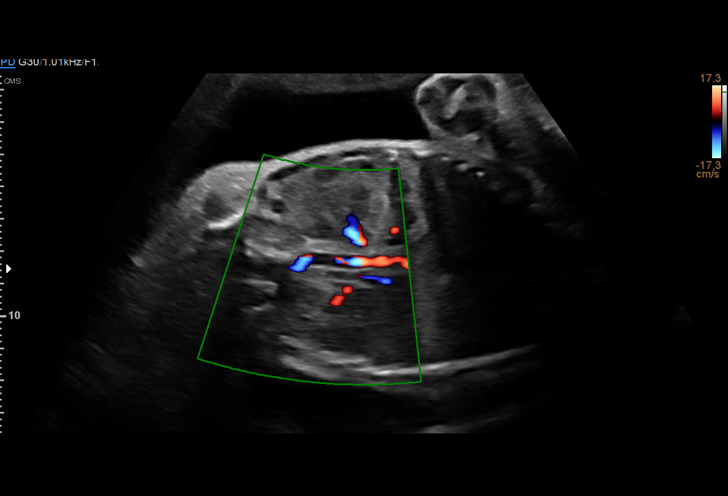
[im 14/41]
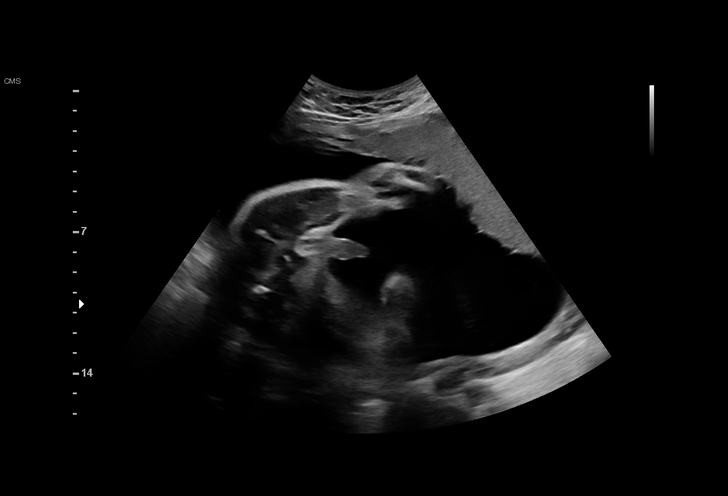
[im 17/41]
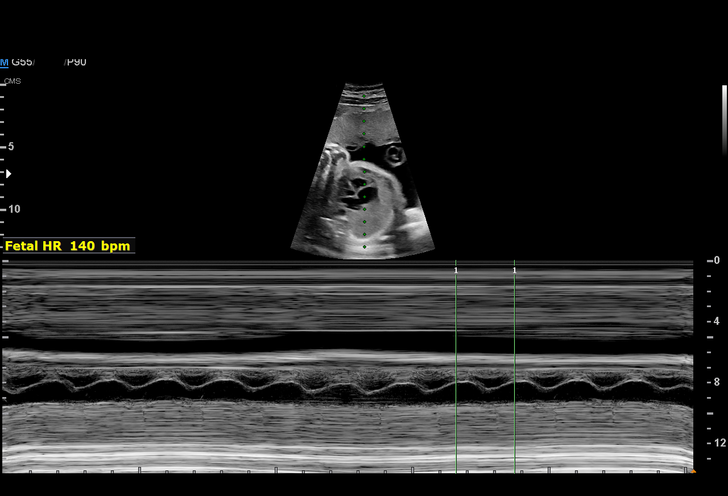
[im 20/41]
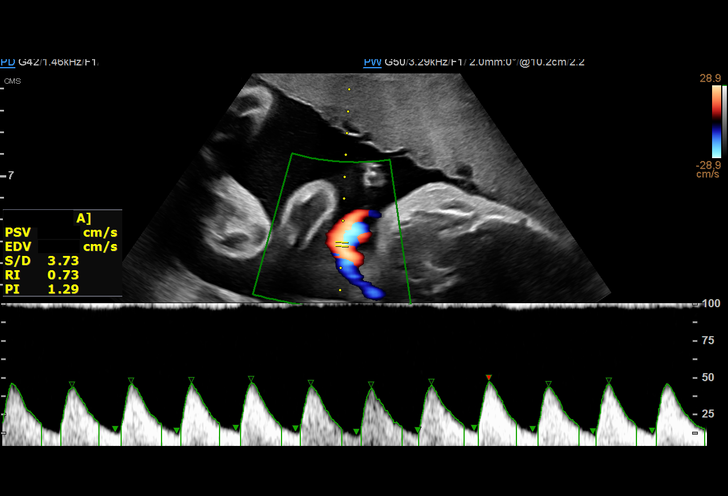
[im 23/41]
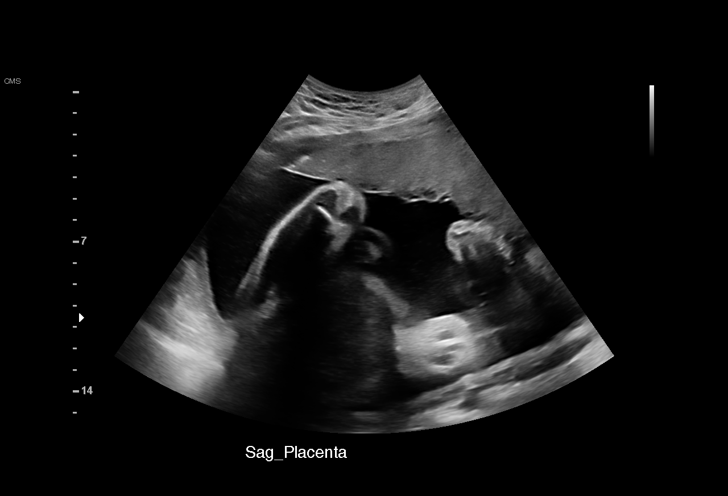
[im 26/41]
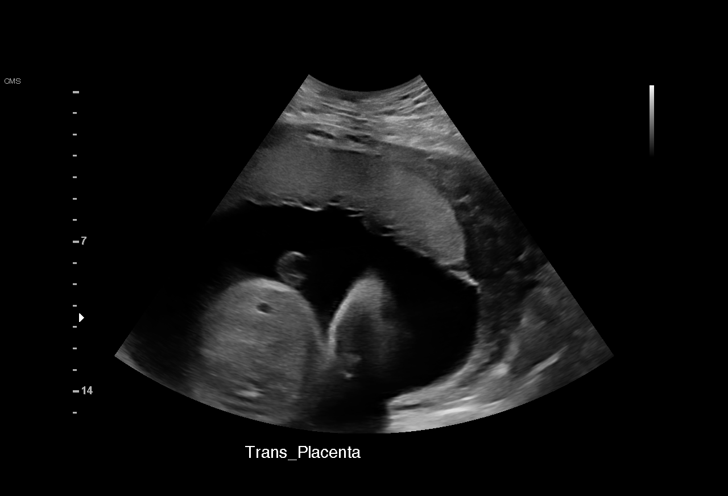
[im 29/41]
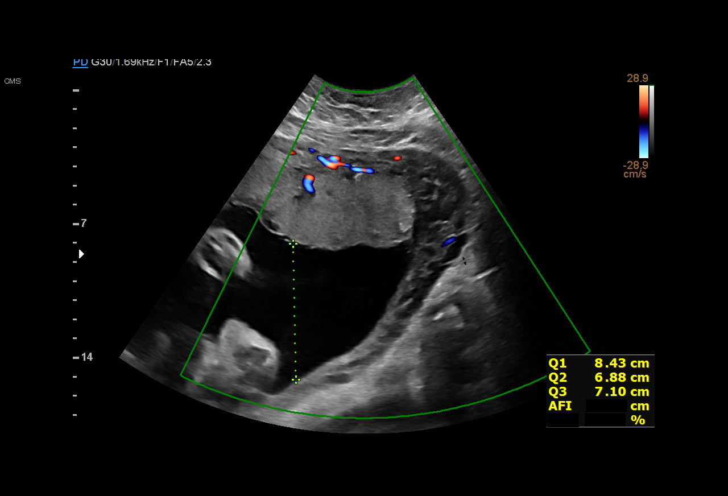
[im 32/41]
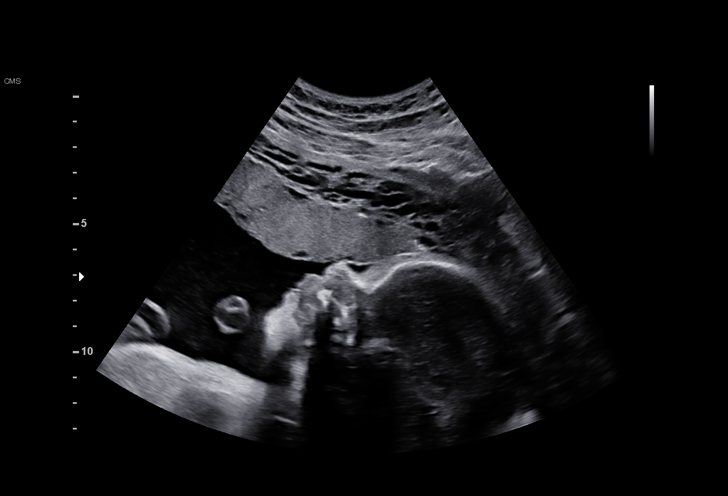
[im 35/41]
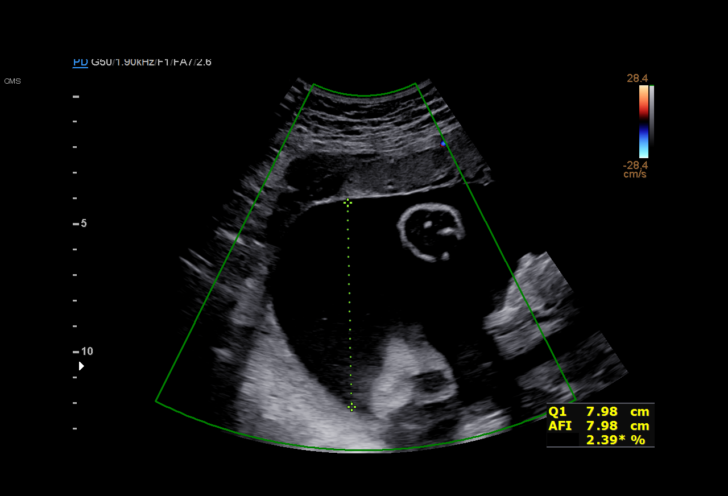
[im 38/41]
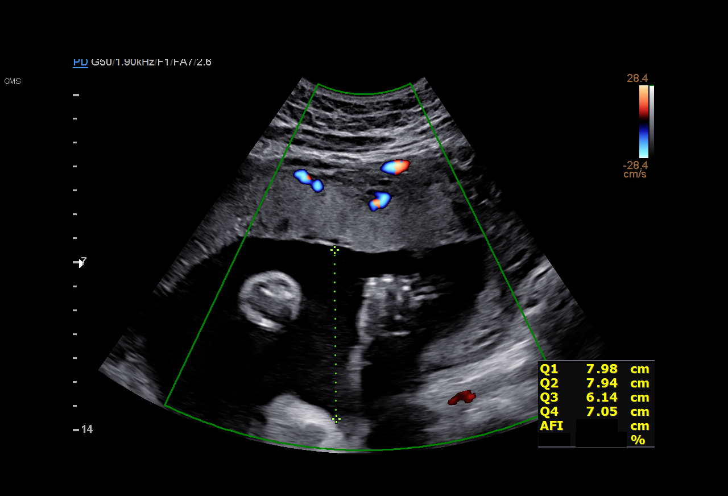
[im 41/41]
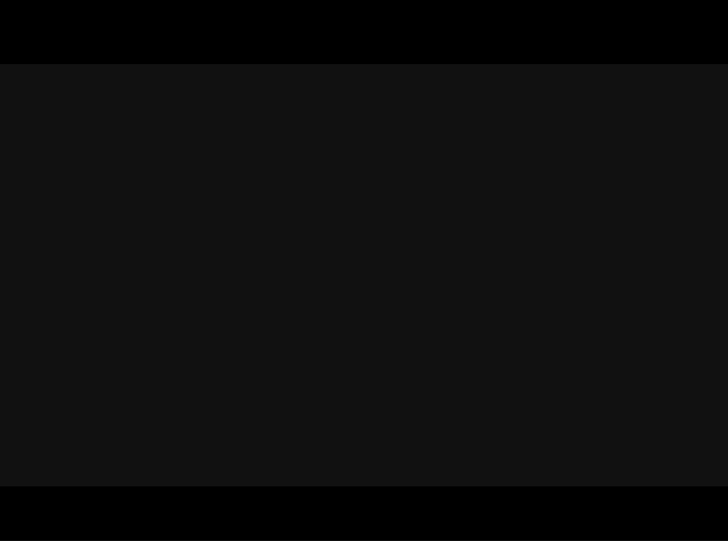

[14 of 28 positions shown; findings below may reference images not displayed]

Referred By:      QUIRIJN AMAZIGH

Indications

 Encounter for other antenatal screening
 follow-up  (low risk NIPS, neg [place])
 Advanced maternal age multigravida 35+,
 third trimester
 Pre-existing diabetes, type 2, in pregnancy,
 third trimester (metformin)
 Tobacco use complicating pregnancy, third
 trimester
 Hypertension - Chronic/Pre-existing (ASA)
 Poor obstetric history: Previous IUFD
 (stillbirth)
 Thrombocytopenia affecting pregnancy,          O99.119,
 antepartum
 History of cesarean delivery, currently
 pregnant
 Maternal care for known or suspected poor
 fetal growth, third trimester, not applicable or
 unspecified IUGR
 29 weeks gestation of pregnancy
 Polyhydramnios, third trimester, antepartum
 condition or complication, unspecified fetus
Vital Signs

                                                Height:        5'3"
Fetal Evaluation
 Num Of Fetuses:         1
 Fetal Heart Rate(bpm):  140
 Cardiac Activity:       Observed
 Presentation:           Cephalic
 Placenta:               Anterior
 P. Cord Insertion:      Previously Visualized

 Amniotic Fluid
 AFI FV:      Polyhydramnios

 AFI Sum(cm)     %Tile       Largest Pocket(cm)
 29.1            > 97

 RUQ(cm)       RLQ(cm)       LUQ(cm)        LLQ(cm)

OB History

 Gravidity:    4         Term:   3
 Living:       2
Gestational Age

 LMP:           29w 4d        Date:  10/13/19                 EDD:   07/19/20
 Best:          29w 4d     Det. By:  LMP  (10/13/19)          EDD:   07/19/20
Doppler - Fetal Vessels

 Umbilical Artery
  S/D     %tile      RI    %tile                             ADFV    RDFV
  3.73       87    0.73       86                                No      No

Impression

 Early onset fetal growth restriction.  Patient to return to for
 umbilical artery Doppler studies.
 Her blood pressures today at her office were 151/94 and
 137/95 mmHg.  She has chronic hypertension that is usually
 well controlled without antihypertensives.  She checks blood
 pressures at home and reports they are within normal range.
 preeclampsia.
 Pregestational diabetes: Patient takes Metformin 500 mg
 twice daily.  She reports her fasting levels are in the 100s.
 She also has gestational thrombocytopenia.

 Mild polyhydramnios is seen.  Good fetal activity is present.
 Incidentally observed antenatal testing is reassuring (BPP
 [DATE]).  Umbilical artery Doppler showed normal forward
 diastolic flow.

 I discussed the importance of good blood glucose control to
 prevent adverse fetal or neonatal outcomes.  I discussed the
 possibility of superimposed preeclampsia in pregnancy is
 complicated by chronic hypertension.  Patient has a follow-up
 prenatal visit appointment tomorrow.
Recommendations

 -Fetal growth assessment next week.
 -Weekly BPP and UA Doppler from next week (possibility of
 superimposed preeclampsia cannot be ruled out).
 -CBC, comp panel tomorrow at prenatal visit.
                 Martino, Roge

## 2020-12-22 IMAGING — DX DG OR LOCAL ABDOMEN
1 series · 2 of 2 positions shown · non-contrast
Comparison: None.

CLINICAL DATA: Emergency hysterectomy, no instrument count
performed

EXAM:
OR LOCAL ABDOMEN

[Series 1: abdomen · 0.14mm/px · 2 of 2 slices shown]
[im 1/2]
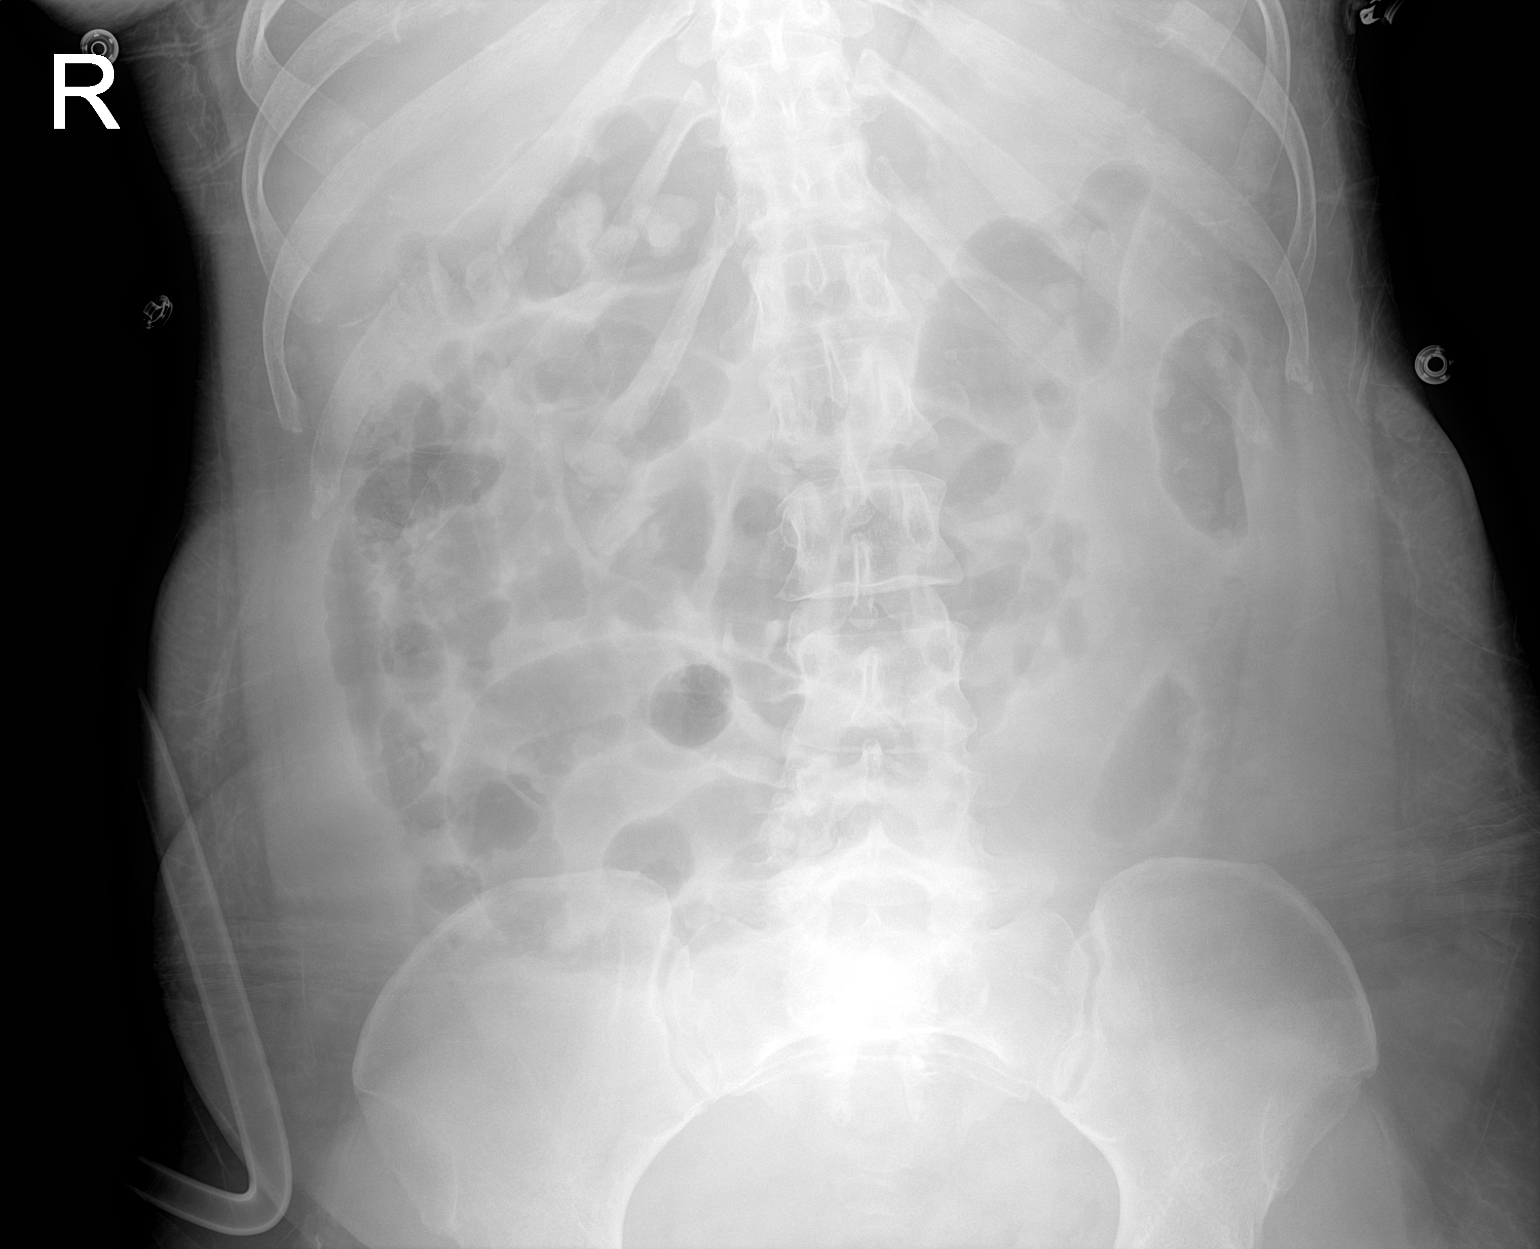
[im 2/2]
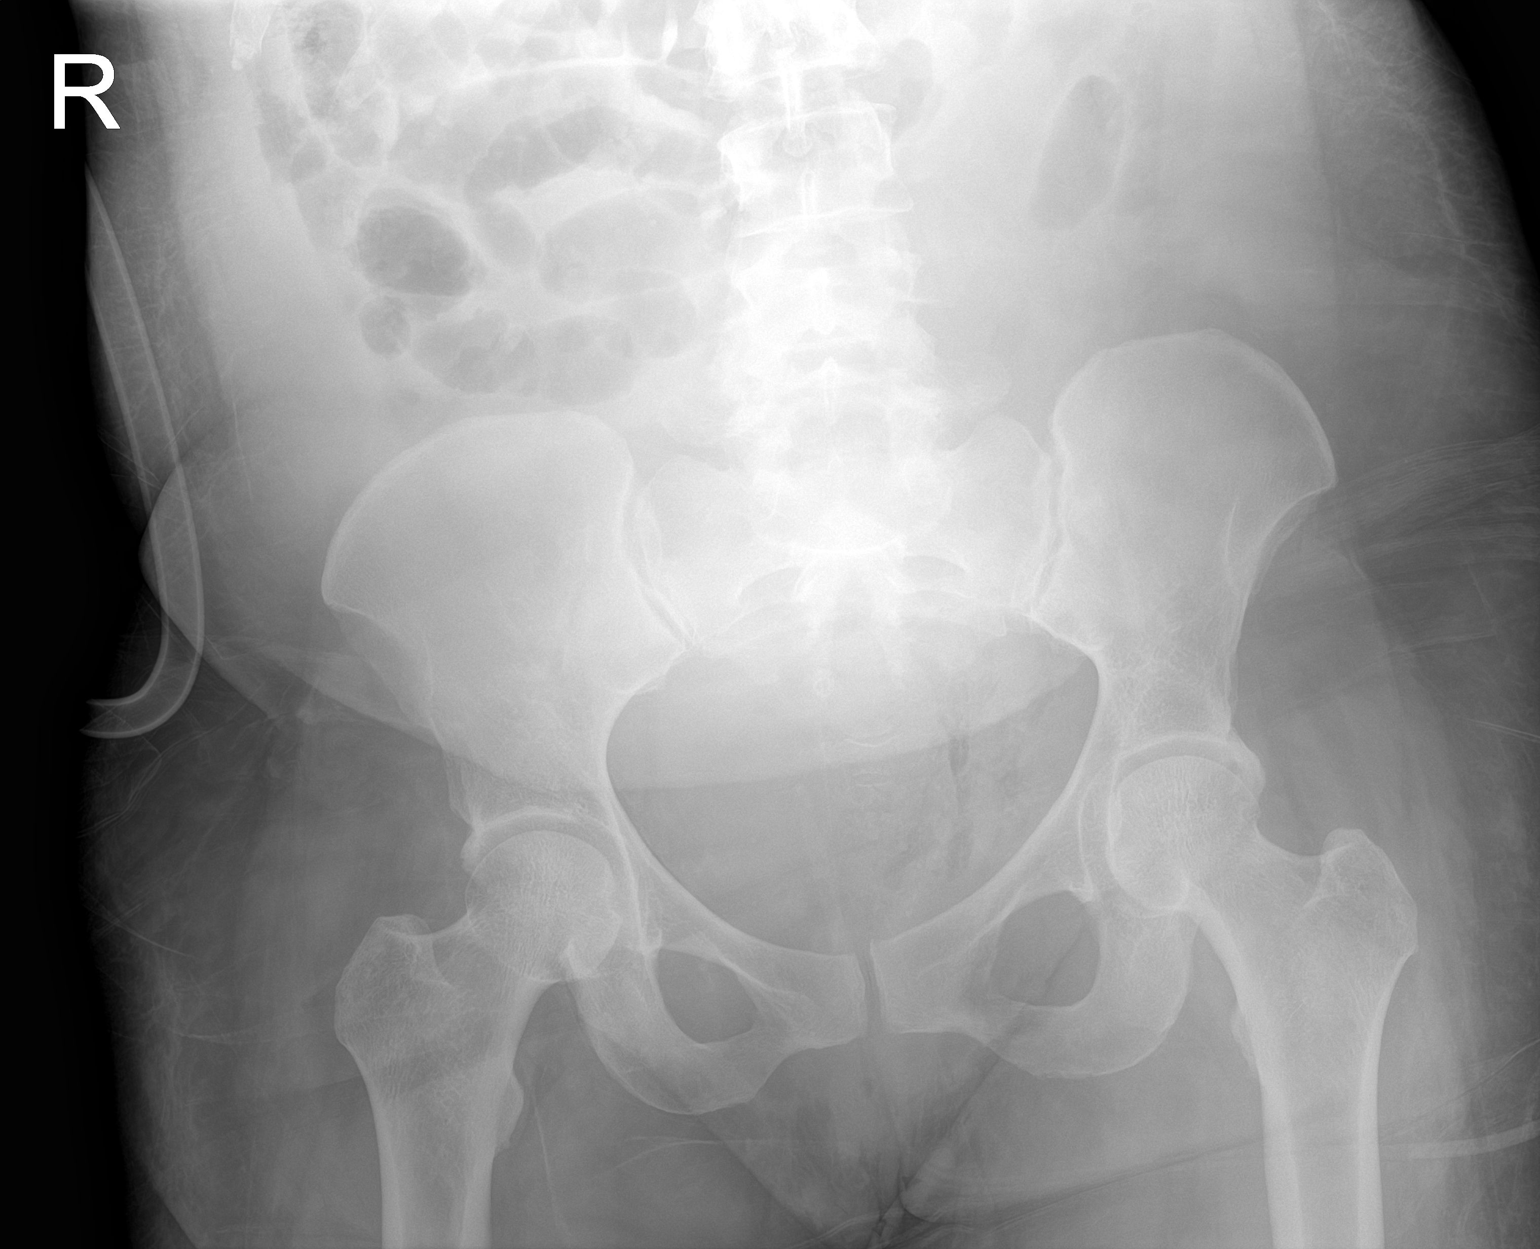

[2 of 2 positions shown; findings below may reference images not displayed]

FINDINGS: Multiple gas-filled loops of large and small bowel are identified in
keeping with an mild ileus. There is displacement of bowel from the
pelvis likely related to the gravid uterus in this recently pregnant
patient. There is interstitial gas within the left hemipelvis,
likely postsurgical given history of recent Caesarean section. No
unexpected radiopaque foreign body identified within the abdomen and
pelvis. No acute bone abnormality.
IMPRESSION: No unexpected radiopaque foreign body within the visualized abdomen
and pelvis.

## 2021-03-25 ENCOUNTER — Ambulatory Visit (HOSPITAL_COMMUNITY)
Admission: EM | Admit: 2021-03-25 | Discharge: 2021-03-25 | Disposition: A | Discharge status: Home / Self Care | Payer: Medicaid Other | Attending: Emergency Medicine | Admitting: Emergency Medicine

## 2021-03-25 ENCOUNTER — Other Ambulatory Visit: Payer: Self-pay

## 2021-03-25 ENCOUNTER — Encounter (HOSPITAL_COMMUNITY): Payer: Self-pay

## 2021-03-25 DIAGNOSIS — I10 Essential (primary) hypertension: Secondary | ICD-10-CM | POA: Diagnosis not present

## 2021-03-25 MED ORDER — LISINOPRIL 10 MG PO TABS: 10.0000 mg | ORAL_TABLET | Freq: Every day | ORAL | 1 refills | Status: DC

## 2021-03-25 NOTE — ED Provider Notes (Signed)
[facility]    CSN: [id number] Arrival date & time: 03/25/21  1402      History   Chief Complaint Chief Complaint  Patient presents with   Dizziness    HPI Terri Bradford is a 40 y.o. female.   Patient here for evaluation of dizziness that has been ongoing for the last several days.  Reports mild dizziness that is stable throughout the day.  Denies any specific alleviating or aggravating factors.  Reports history of hypertension but has not taken blood pressure medications today.  BP 180/100 in office.  Reports that she does not have any way to monitor blood pressures at home.  Denies any blurred vision.  Denies any trauma, injury, or other precipitating event.  Denies any fevers, chest pain, shortness of breath, N/V/D, numbness, tingling, weakness, abdominal pain, or headaches.    The history is provided by the patient.  Dizziness  Past Medical History:  Diagnosis Date   Anxiety    Blood transfusion without reported diagnosis    Diabetes mellitus without complication ([place])    Gestational diabetes    Hypertension    Pregnancy induced hypertension    Thrombocytopenia, unspecified ([place])    Thrombocytopenia    Patient Active Problem List   Diagnosis Date Noted   S/P emergency cesarean hysterectomy 07/04/2020   Thrombocytopenia ([place]) 01/04/2020   History of intrauterine fetal death in previous pregnancy 01/15/2020   Chronic hypertension in pregnancy Jan 15, 2020   History of cesarean section 01-15-2020   AMA (advanced maternal age) multigravida 35+ January 15, 2020   Supervision of high risk pregnancy, antepartum 12/26/2019   Type 2 diabetes mellitus in pregnancy, second trimester 12/12/2014    Past Surgical History:  Procedure Laterality Date   ABDOMINAL HYSTERECTOMY Bilateral 06/30/2020   Procedure: HYSTERECTOMY ABDOMINAL;  Surgeon: Aletha Halim, MD;  [facility]: [facility];  Service: Obstetrics;  Laterality: Bilateral;   CESAREAN SECTION     CESAREAN SECTION N/A  06/30/2020   Procedure: CESAREAN SECTION;  Surgeon: Aletha Halim, MD;  Location: [facility];  Service: Obstetrics;  Laterality: N/A;   CYSTOSCOPY N/A 06/30/2020   Procedure: CYSTOSCOPY;  Surgeon: Aletha Halim, MD;  [facility]: [facility];  Service: Obstetrics;  Laterality: N/A;    OB History     Gravida  4   Para  4   Term  4   Preterm  0   AB  0   Living  3      SAB  0   IAB  0   Ectopic  0   Multiple  0   Live Births  3            Home Medications    Prior to Admission medications   Medication Sig Start Date End Date Taking? Authorizing Provider  Blood Pressure Monitoring (BLOOD PRESSURE KIT) DEVI 1 kit by Does not apply route once a week. Check Blood Pressure regularly and record readings into the [employer] App.  Large Cuff.  DX O90.0 Patient not taking: Reported on 08/13/2020 12/26/19   Woodroe Mode, MD  cyclobenzaprine (FLEXERIL) 10 MG tablet Take 1 tablet (10 mg total) by mouth 2 (two) times daily as needed for muscle spasms. 09/28/20   Ripley Fraise, MD  ferrous sulfate 325 (65 FE) MG tablet Take 1 tablet (325 mg total) by mouth every other day. Patient not taking: Reported on 08/13/2020 07/17/20   Shelly Bombard, MD  ibuprofen (ADVIL) 800 MG tablet Take 1 tablet (800 mg total)  by mouth every 8 (eight) hours as needed. 08/13/20   Shelly Bombard, MD  lisinopril (ZESTRIL) 10 MG tablet Take 1 tablet (10 mg total) by mouth daily. 03/25/21   Pearson Forster, NP  metFORMIN (GLUCOPHAGE) 500 MG tablet Take 1 tablet (500 mg total) by mouth 2 (two) times daily with a meal. Patient not taking: Reported on 08/13/2020 01/31/20 09/28/20  Chauncey Mann, MD  NIFEdipine (ADALAT CC) 60 MG 24 hr tablet Take 1 tablet (60 mg total) by mouth 2 (two) times daily. 07/04/20 09/28/20  Donnamae Jude, MD    Family History Family History  Problem Relation Age of Onset   Hypertension Mother    COPD Mother    Anxiety disorder Sister    Diabetes Sister     Social  History Social History   Tobacco Use   Smoking status: Every Day    Packs/day: 0.25    Types: Cigarettes   Smokeless tobacco: Never  Vaping Use   Vaping Use: Never used  Substance Use Topics   Alcohol use: Yes    Comment: occ   Drug use: No     Allergies   Patient has no known allergies.   Review of Systems Review of Systems  Neurological:  Positive for dizziness.  All other systems reviewed and are negative.   Physical Exam Triage Vital Signs ED Triage Vitals  Enc Vitals Group     BP 03/25/21 1519 (!) 180/100     Pulse Rate 03/25/21 1519 64     Resp 03/25/21 1510 18     Temp 03/25/21 1510 98.4 F (36.9 C)     Temp Source 03/25/21 1510 Oral     SpO2 03/25/21 1510 100 %     Weight --      Height --      Head Circumference --      Peak Flow --      Pain Score 03/25/21 1507 0     Pain Loc --      Pain Edu? --      Excl. in [place]? --    No data found.  Updated Vital Signs BP (!) 182/100 (BP Location: Right Arm)   Pulse 64   Temp 98.4 F (36.9 C) (Oral)   Resp 18   LMP 10/13/2019   SpO2 100%   Visual Acuity Right Eye Distance:   Left Eye Distance:   Bilateral Distance:    Right Eye Near:   Left Eye Near:    Bilateral Near:     Physical Exam Vitals and nursing note reviewed.  Constitutional:      General: She is not in acute distress.    Appearance: Normal appearance. She is not ill-appearing, toxic-appearing or diaphoretic.  HENT:     Head: Normocephalic and atraumatic.     Nose: No congestion.  Eyes:     Extraocular Movements: Extraocular movements intact.     Conjunctiva/sclera: Conjunctivae normal.     Pupils: Pupils are equal, round, and reactive to light.  Cardiovascular:     Rate and Rhythm: Normal rate.     Pulses: Normal pulses.     Heart sounds: Normal heart sounds.  Pulmonary:     Effort: Pulmonary effort is normal.     Breath sounds: Normal breath sounds.  Abdominal:     General: Abdomen is flat.  Musculoskeletal:         General: Normal range of motion.     Cervical back: Normal range of  motion.  Skin:    General: Skin is warm and dry.  Neurological:     General: No focal deficit present.     Mental Status: She is alert and oriented to person, place, and time.     GCS: GCS eye subscore is 4. GCS verbal subscore is 5. GCS motor subscore is 6.     Cranial Nerves: Cranial nerves are intact.     Sensory: Sensation is intact.     Motor: Motor function is intact.     Coordination: Coordination is intact.     Gait: Gait is intact.  Psychiatric:        Mood and Affect: Mood normal.     UC Treatments / Results  Labs (all labs ordered are listed, but only abnormal results are displayed) Labs Reviewed - No data to display  EKG   Radiology No results found.  Procedures Procedures (including critical care time)  Medications Ordered in UC Medications - No data to display  Initial Impression / Assessment and Plan / UC Course  I have reviewed the triage vital signs and the nursing notes.  Pertinent labs & imaging results that were available during my care of the patient were reviewed by me and considered in my medical decision making (see chart for details).    Hypertension.  Patient encouraged to take her lisinopril daily as prescribed.  Refill sent in but instructed patient that she needs to follow-up with her primary care for reevaluation and possible medication change.  Assessment negative for red flags or concerns including intracranial abnormality or CVA.  Encourage a DASH diet and encourage fluids.  Follow-up with primary care.  Strict ED follow-up for any red flag symptoms. Final Clinical Impressions(s) / UC Diagnoses   Final diagnoses:  Hypertension, unspecified type     Discharge Instructions      Take the lisinopril daily as prescribed.    Try to follow the DASH diet.  Make sure you drink plenty of water.   Follow up with your primary care provider for re-evaluation of your blood  pressures.    If you develop the worse headache of your life, worsening dizziness, nausea/vomiting, blurred vision, slurred speech, difficulty walking, weakness on one side, chest pain, shortness of breath, or altered mental status, call 911 or go directly to the Emergency Department for further evaluation.        ED Prescriptions     Medication Sig Dispense Auth. Provider   lisinopril (ZESTRIL) 10 MG tablet Take 1 tablet (10 mg total) by mouth daily. 30 tablet Pearson Forster, NP      PDMP not reviewed this encounter.   Pearson Forster, NP 03/25/21 1538

## 2021-03-25 NOTE — Discharge Instructions (Addendum)
Take the lisinopril daily as prescribed.    Try to follow the DASH diet.  Make sure you drink plenty of water.   Follow up with your primary care provider for re-evaluation of your blood pressures.    If you develop the worse headache of your life, worsening dizziness, nausea/vomiting, blurred vision, slurred speech, difficulty walking, weakness on one side, chest pain, shortness of breath, or altered mental status, call 911 or go directly to the Emergency Department for further evaluation.

## 2021-03-25 NOTE — ED Triage Notes (Addendum)
Pt states she has been having some dizziness when standing. She states she has not taken her blood pressure medication today.  Started: Yesterday  Around 15:22 Provider made aware of BP

## 2021-04-03 ENCOUNTER — Other Ambulatory Visit: Payer: Self-pay

## 2021-04-03 ENCOUNTER — Encounter (HOSPITAL_COMMUNITY): Payer: Self-pay

## 2021-04-03 ENCOUNTER — Emergency Department (HOSPITAL_COMMUNITY)
Admission: EM | Admit: 2021-04-03 | Discharge: 2021-04-03 | Disposition: A | Discharge status: Home / Self Care | Payer: Medicaid Other | Attending: Emergency Medicine | Admitting: Emergency Medicine

## 2021-04-03 DIAGNOSIS — R42 Dizziness and giddiness: Secondary | ICD-10-CM | POA: Insufficient documentation

## 2021-04-03 DIAGNOSIS — E119 Type 2 diabetes mellitus without complications: Secondary | ICD-10-CM | POA: Insufficient documentation

## 2021-04-03 DIAGNOSIS — I10 Essential (primary) hypertension: Secondary | ICD-10-CM | POA: Diagnosis not present

## 2021-04-03 DIAGNOSIS — Z79899 Other long term (current) drug therapy: Secondary | ICD-10-CM | POA: Diagnosis not present

## 2021-04-03 DIAGNOSIS — F17210 Nicotine dependence, cigarettes, uncomplicated: Secondary | ICD-10-CM | POA: Insufficient documentation

## 2021-04-03 LAB — CBC
HCT: 47.7 % — ABNORMAL HIGH (ref 36.0–46.0)
Hemoglobin: 16.1 g/dL — ABNORMAL HIGH (ref 12.0–15.0)
MCH: 31.3 pg (ref 26.0–34.0)
MCHC: 33.8 g/dL (ref 30.0–36.0)
MCV: 92.6 fL (ref 80.0–100.0)
Platelets: 126 10*3/uL — ABNORMAL LOW (ref 150–400)
RBC: 5.15 MIL/uL — ABNORMAL HIGH (ref 3.87–5.11)
RDW: 12.7 % (ref 11.5–15.5)
WBC: 5.8 10*3/uL (ref 4.0–10.5)
nRBC: 0 % (ref 0.0–0.2)

## 2021-04-03 LAB — BASIC METABOLIC PANEL
Anion gap: 10 (ref 5–15)
BUN: 5 mg/dL — ABNORMAL LOW (ref 6–20)
CO2: 22 mmol/L (ref 22–32)
Calcium: 9.3 mg/dL (ref 8.9–10.3)
Chloride: 108 mmol/L (ref 98–111)
Creatinine, Ser: 0.75 mg/dL (ref 0.44–1.00)
GFR, Estimated: >60 mL/min (ref >60)
Glucose, Bld: 123 mg/dL — ABNORMAL HIGH (ref 70–99)
Potassium: 3.9 mmol/L (ref 3.5–5.1)
Sodium: 140 mmol/L (ref 135–145)

## 2021-04-03 NOTE — ED Triage Notes (Signed)
Pt c.o dizziness for the past 2 weeks, intermittently. Pt seen at [facility] and was told it could be her BP meds. Pt denies chest pain, near syncope or nausea. Pt a.o nad noted

## 2021-04-03 NOTE — ED Provider Notes (Signed)
I saw and evaluated the patient, reviewed the resident's note and I agree with the findings and plan.  EKG Interpretation  Date/Time:  Friday April 03 2021 14:16:41 EDT Ventricular Rate:  72 PR Interval:  128 QRS Duration: 62 QT Interval:  364 QTC Calculation: 398 R Axis:   -11 Text Interpretation: Normal sinus rhythm Septal infarct , age undetermined ST & T wave abnormality, consider inferior ischemia Abnormal ECG Confirmed by Lorre Nick ([id number]) on 04/03/2021 6:01:41 PM    40 year old female here with dizziness which got worse this morning.  States she did overachiever alcohol yesterday evening.  No other associated symptoms.  Labs are reassuring here and will discharge   Lorre Nick, MD 04/03/21 1815

## 2021-04-03 NOTE — ED Provider Notes (Signed)
Emergency Medicine Provider Triage Evaluation Note  Terri Bradford , a 40 y.o. female  was evaluated in triage.  Pt complains of intermittent dizziness x 2 weeks. Symptoms are worse in the mornings prior to taking blood pressure. Went to urgent care and was told symptoms could be related to BP meds, she takes amlodipine. Has been told she has diabetes, no formal dx, takes no medications.  Review of Systems  Positive: Dizziness Negative: Headache, nausea, vomiting, CP, SOB, syncope, numbness, tingling  Physical Exam  BP (!) 159/105   Pulse 73   Temp 98.8 F (37.1 C) (Oral)   Resp 18   LMP 10/13/2019   SpO2 100%  Gen:   Awake, no distress   Resp:  Normal effort  MSK:   Moves extremities without difficulty  Other:  5/5 strength all extremities, sensation in tact  Medical Decision Making  Medically screening exam initiated at 2:34 PM.  Appropriate orders placed.  Terri Bradford was informed that the remainder of the evaluation will be completed by another provider, this initial triage assessment does not replace that evaluation, and the importance of remaining in the ED until their evaluation is complete.     Jeanella Flattery 04/03/21 1437    Terald Sleeper, MD 04/03/21 [phone]

## 2021-04-03 NOTE — ED Provider Notes (Signed)
[facility] Provider Note   CSN: [id number] Arrival date & time: 04/03/21  1401     History Chief Complaint  Patient presents with   Dizziness    Terri Bradford is a 40 y.o. female with PMHx HTN, DM who presents for evaluation of lightheadedness.   Patient reports that she was drinking alcohol yesterday evening.  Upon waking this morning, she notes that she was feeling malaised and lightheaded.  She states that she subsequently presented to emergency department for further evaluation.  Otherwise, she states that she has been in her normal state of health for the preceding days.  She denies any recent headache, syncope, numbness, weakness, vision changes, chest pain, shortness of breath, abdominal pain, diarrhea, vomiting, or rash.  She denies any recent sick contacts.     Past Medical History:  Diagnosis Date   Anxiety    Blood transfusion without reported diagnosis    Diabetes mellitus without complication ([place])    Gestational diabetes    Hypertension    Pregnancy induced hypertension    Thrombocytopenia, unspecified ([place])    Thrombocytopenia    Patient Active Problem List   Diagnosis Date Noted   S/P emergency cesarean hysterectomy 07/04/2020   Thrombocytopenia ([place]) 01/04/2020   History of intrauterine fetal death in previous pregnancy Jan 28, 2020   Chronic hypertension in pregnancy 2020/01/28   History of cesarean section 01/28/2020   AMA (advanced maternal age) multigravida 35+ 01-28-2020   Supervision of high risk pregnancy, antepartum 12/26/2019   Type 2 diabetes mellitus in pregnancy, second trimester 12/12/2014    Past Surgical History:  Procedure Laterality Date   ABDOMINAL HYSTERECTOMY Bilateral 06/30/2020   Procedure: HYSTERECTOMY ABDOMINAL;  Surgeon: Aletha Halim, MD;  [facility];  Service: Obstetrics;  Laterality: Bilateral;   CESAREAN SECTION     CESAREAN SECTION N/A 06/30/2020   Procedure: CESAREAN  SECTION;  Surgeon: Aletha Halim, MD;  [facility]: [facility];  Service: Obstetrics;  Laterality: N/A;   CYSTOSCOPY N/A 06/30/2020   Procedure: CYSTOSCOPY;  Surgeon: Aletha Halim, MD;  [facility];  Service: Obstetrics;  Laterality: N/A;     OB History     Gravida  4   Para  4   Term  4   Preterm  0   AB  0   Living  3      SAB  0   IAB  0   Ectopic  0   Multiple  0   Live Births  3           Family History  Problem Relation Age of Onset   Hypertension Mother    COPD Mother    Anxiety disorder Sister    Diabetes Sister     Social History   Tobacco Use   Smoking status: Every Day    Packs/day: 0.25    Types: Cigarettes   Smokeless tobacco: Never  Vaping Use   Vaping Use: Never used  Substance Use Topics   Alcohol use: Yes    Comment: occ   Drug use: No    Home Medications Prior to Admission medications   Medication Sig Start Date End Date Taking? Authorizing Provider  amLODipine-benazepril (LOTREL) 5-20 MG capsule Take 1 capsule by mouth daily. 03/26/21  Yes [provider]  Blood Pressure Monitoring (BLOOD PRESSURE KIT) DEVI 1 kit by Does not apply route once a week. Check Blood Pressure regularly and record readings into the [employer] App.  Large  Cuff.  DX O90.0 Patient not taking: No sig reported 12/26/19   Woodroe Mode, MD  metFORMIN (GLUCOPHAGE) 500 MG tablet Take 1 tablet (500 mg total) by mouth 2 (two) times daily with a meal. Patient not taking: Reported on 08/13/2020 01/31/20 09/28/20  Chauncey Mann, MD  NIFEdipine (ADALAT CC) 60 MG 24 hr tablet Take 1 tablet (60 mg total) by mouth 2 (two) times daily. 07/04/20 09/28/20  Donnamae Jude, MD    Allergies    Patient has no known allergies.  Review of Systems   Review of Systems  Constitutional:  Negative for chills and fever.  HENT:  Negative for ear pain and sore throat.   Eyes:  Negative for pain and visual disturbance.  Respiratory:  Negative for cough and  shortness of breath.   Cardiovascular:  Negative for chest pain and palpitations.  Gastrointestinal:  Negative for abdominal pain and vomiting.  Genitourinary:  Negative for dysuria and hematuria.  Musculoskeletal:  Negative for arthralgias and back pain.  Skin:  Negative for color change and rash.  Neurological:  Positive for light-headedness. Negative for dizziness, seizures and syncope.  All other systems reviewed and are negative.  Physical Exam Updated Vital Signs BP (!) 145/78 (BP Location: Right Arm)   Pulse 68   Temp 98 F (36.7 C) (Oral)   Resp 15   LMP 10/13/2019   SpO2 100%   Physical Exam Vitals and nursing note reviewed.  Constitutional:      General: She is not in acute distress.    Appearance: She is well-developed.  HENT:     Head: Normocephalic and atraumatic.     Mouth/Throat:     Mouth: Mucous membranes are dry.  Eyes:     Conjunctiva/sclera: Conjunctivae normal.  Cardiovascular:     Rate and Rhythm: Normal rate and regular rhythm.     Heart sounds: No murmur heard. Pulmonary:     Effort: Pulmonary effort is normal. No respiratory distress.     Breath sounds: Normal breath sounds.  Abdominal:     Palpations: Abdomen is soft.     Tenderness: There is no abdominal tenderness.  Musculoskeletal:     Cervical back: Neck supple.  Skin:    General: Skin is warm and dry.     Capillary Refill: Capillary refill takes less than 2 seconds.  Neurological:     General: No focal deficit present.     Mental Status: She is alert and oriented to person, place, and time. Mental status is at baseline.     Cranial Nerves: No cranial nerve deficit.     Sensory: No sensory deficit.     Motor: No weakness.     Coordination: Coordination normal.     Gait: Gait normal.    ED Results / Procedures / Treatments   Labs (all labs ordered are listed, but only abnormal results are displayed) Labs Reviewed  BASIC METABOLIC PANEL - Abnormal; Notable for the following  components:      Result Value   Glucose, Bld 123 (*)    BUN 5 (*)    All other components within normal limits  CBC - Abnormal; Notable for the following components:   RBC 5.15 (*)    Hemoglobin 16.1 (*)    HCT 47.7 (*)    Platelets 126 (*)    All other components within normal limits    EKG EKG Interpretation  Date/Time:  Friday April 03 2021 14:16:41 EDT Ventricular Rate:  72 PR  Interval:  128 QRS Duration: 62 QT Interval:  364 QTC Calculation: 398 R Axis:   -11 Text Interpretation: Normal sinus rhythm Septal infarct , age undetermined ST & T wave abnormality, consider inferior ischemia Abnormal ECG Confirmed by Lacretia Leigh ([id number]) on 04/03/2021 6:01:24 PM  Radiology No results found.  Procedures Procedures   Medications Ordered in ED Medications - No data to display  ED Course  I have reviewed the triage vital signs and the nursing notes.  Pertinent labs & imaging results that were available during my care of the patient were reviewed by me and considered in my medical decision making (see chart for details).    MDM Rules/Calculators/A&P                           40 y.o. female with past medical history as above who presents for evaluation of lightheadedness after alcohol intake yesterday evening.  Afebrile and hemodynamically stable.  Normotensive.  Exam is notable for dry mucous membranes but otherwise benign.  Labs are notable for slightly elevated hemoglobin, suggesting hemoconcentration.  BMP is unremarkable.  No evidence of anion gap or acidosis.  EKG is NSR.  Patient presentation is consistent with mild dehydration in the context of recent alcohol use.   I have low suspicion for alternative emergent etiologies contributing to her symptoms. Patient is able to tolerate p.o. intake here in the emergency department.  She is suitable for discharge at this time.   Final Clinical Impression(s) / ED Diagnoses Final diagnoses:  Lightheadedness    Rx / DC  Orders ED Discharge Orders     None        Violet Baldy, MD 04/04/21 1253    Lacretia Leigh, MD 04/06/21 2234

## 2021-08-29 ENCOUNTER — Encounter (HOSPITAL_COMMUNITY): Payer: Self-pay

## 2021-08-29 ENCOUNTER — Ambulatory Visit (HOSPITAL_COMMUNITY)
Admission: EM | Admit: 2021-08-29 | Discharge: 2021-08-29 | Disposition: A | Discharge status: Home / Self Care | Payer: Medicaid Other | Attending: Internal Medicine | Admitting: Internal Medicine

## 2021-08-29 ENCOUNTER — Other Ambulatory Visit: Payer: Self-pay

## 2021-08-29 DIAGNOSIS — R42 Dizziness and giddiness: Secondary | ICD-10-CM | POA: Diagnosis not present

## 2021-08-29 DIAGNOSIS — Z90710 Acquired absence of both cervix and uterus: Secondary | ICD-10-CM

## 2021-08-29 LAB — CBG MONITORING, ED: Glucose-Capillary: 157 mg/dL — ABNORMAL HIGH (ref 70–99)

## 2021-08-29 NOTE — ED Notes (Signed)
Medical Provider Ok'd for pt to wait out in waiting area.

## 2021-08-29 NOTE — ED Triage Notes (Signed)
Pt presents with dizziness x 2 days.   States she does not know why it started.   States she has not checked her sugar in a while. States she was taken off of her medications.

## 2021-08-29 NOTE — ED Triage Notes (Signed)
Last time she was seen by her PCP, states it was around 1 month ago.

## 2021-08-29 NOTE — Discharge Instructions (Addendum)
-  Drink plenty of fluids -I think that your symptoms will resolve in the next few days. If symptoms change like severe headaches, weakness, worsening dizziness, vision changes, etc - head to the ED. -If symptoms persist - follow-up with your primary care doctor.

## 2021-08-29 NOTE — ED Provider Notes (Signed)
[name]    CSN: [id number] Arrival date & time: 08/29/21  1024      History   Chief Complaint Chief Complaint  Patient presents with   Dizziness    HPI Terri Bradford is a 41 y.o. female presenting with dizziness.  Medical history hypertension, gestational diabetes.  States that she has felt lightheaded upon standing for the last 2 days.  She has had similar episodes in the past after drinking alcohol, states that she did drink alcohol about 4 days, but none immediately before symptoms started.  Describes this as sensation of lightheadedness that lasts for about 3 seconds following standing up and resolves on its own.  Denies syncopal episode.  Denies headaches, vision changes, ear pain, throat pain, abdominal pain, etc.  She did have a virus 3 weeks ago, this had completely resolved prior to onset of symptoms.  Denies any sensation of room spinning.  Denies shortness of breath, chest pain.  HPI  Past Medical History:  Diagnosis Date   Anxiety    Blood transfusion without reported diagnosis    Diabetes mellitus without complication ([place])    Gestational diabetes    Hypertension    Pregnancy induced hypertension    Thrombocytopenia, unspecified ([place])    Thrombocytopenia    Patient Active Problem List   Diagnosis Date Noted   S/P emergency cesarean hysterectomy 07/04/2020   Thrombocytopenia ([place]) 01/04/2020   History of intrauterine fetal death in previous pregnancy 2020-01-20   Chronic hypertension in pregnancy 2020/01/20   History of cesarean section 01/20/2020   AMA (advanced maternal age) multigravida 35+ 20-Jan-2020   Supervision of high risk pregnancy, antepartum 12/26/2019   Type 2 diabetes mellitus in pregnancy, second trimester 12/12/2014    Past Surgical History:  Procedure Laterality Date   ABDOMINAL HYSTERECTOMY Bilateral 06/30/2020   Procedure: HYSTERECTOMY ABDOMINAL;  Surgeon: Aletha Halim, MD;  Location: [facility] LD ORS;  Service: Obstetrics;   Laterality: Bilateral;   CESAREAN SECTION     CESAREAN SECTION N/A 06/30/2020   Procedure: CESAREAN SECTION;  Surgeon: Aletha Halim, MD;  Location: [facility] LD ORS;  Service: Obstetrics;  Laterality: N/A;   CYSTOSCOPY N/A 06/30/2020   Procedure: CYSTOSCOPY;  Surgeon: Aletha Halim, MD;  Location: [facility] LD ORS;  Service: Obstetrics;  Laterality: N/A;    OB History     Gravida  4   Para  4   Term  4   Preterm  0   AB  0   Living  3      SAB  0   IAB  0   Ectopic  0   Multiple  0   Live Births  3            Home Medications    Prior to Admission medications   Medication Sig Start Date End Date Taking? Authorizing Provider  amLODipine-benazepril (LOTREL) 5-20 MG capsule Take 1 capsule by mouth daily. 03/26/21   [provider]  Blood Pressure Monitoring (BLOOD PRESSURE KIT) DEVI 1 kit by Does not apply route once a week. Check Blood Pressure regularly and record readings into the [employer] App.  Large Cuff.  DX O90.0 Patient not taking: No sig reported 12/26/19   Woodroe Mode, MD  metFORMIN (GLUCOPHAGE) 500 MG tablet Take 1 tablet (500 mg total) by mouth 2 (two) times daily with a meal. Patient not taking: Reported on 08/13/2020 01/31/20 09/28/20  Chauncey Mann, MD  NIFEdipine (ADALAT CC) 60 MG 24 hr tablet Take 1  tablet (60 mg total) by mouth 2 (two) times daily. 07/04/20 09/28/20  Donnamae Jude, MD    Family History Family History  Problem Relation Age of Onset   Hypertension Mother    COPD Mother    Anxiety disorder Sister    Diabetes Sister     Social History Social History   Tobacco Use   Smoking status: Every Day    Packs/day: 0.25    Types: Cigarettes   Smokeless tobacco: Never  Vaping Use   Vaping Use: Never used  Substance Use Topics   Alcohol use: Yes    Comment: occ   Drug use: No     Allergies   Patient has no known allergies.   Review of Systems Review of Systems  Neurological:  Positive for light-headedness. Negative  for dizziness, tremors, seizures, syncope, facial asymmetry, speech difficulty, weakness, numbness and headaches.  All other systems reviewed and are negative.   Physical Exam Triage Vital Signs ED Triage Vitals  Enc Vitals Group     BP 08/29/21 1102 121/86     Pulse Rate 08/29/21 1102 62     Resp --      Temp 08/29/21 1102 98.4 F (36.9 C)     Temp Source 08/29/21 1102 Oral     SpO2 08/29/21 1102 98 %     Weight --      Height --      Head Circumference --      Peak Flow --      Pain Score 08/29/21 1100 0     Pain Loc --      Pain Edu? --      Excl. in [place]? --    No data found.  Updated Vital Signs BP 121/86 (BP Location: Right Arm)    Pulse 62    Temp 98.4 F (36.9 C) (Oral)    LMP 10/13/2019    SpO2 98%   Visual Acuity Right Eye Distance:   Left Eye Distance:   Bilateral Distance:    Right Eye Near:   Left Eye Near:    Bilateral Near:     Physical Exam Vitals reviewed.  Constitutional:      General: She is not in acute distress.    Appearance: Normal appearance. She is not ill-appearing.  HENT:     Head: Normocephalic and atraumatic.  Eyes:     Extraocular Movements: Extraocular movements intact.     Pupils: Pupils are equal, round, and reactive to light.  Cardiovascular:     Rate and Rhythm: Normal rate and regular rhythm.     Heart sounds: Normal heart sounds.  Pulmonary:     Effort: Pulmonary effort is normal.     Breath sounds: Normal breath sounds. No wheezing, rhonchi or rales.  Musculoskeletal:     Cervical back: Normal range of motion and neck supple. No rigidity.  Lymphadenopathy:     Cervical: No cervical adenopathy.  Skin:    Capillary Refill: Capillary refill takes 2 to 3 seconds.  Neurological:     General: No focal deficit present.     Mental Status: She is alert and oriented to person, place, and time. Mental status is at baseline.     Cranial Nerves: No cranial nerve deficit or facial asymmetry.     Sensory: Sensation is intact. No  sensory deficit.     Motor: Motor function is intact. No weakness.     Coordination: Coordination is intact. Coordination normal.     Gait:  Gait is intact. Gait normal.     Comments: CN 2-12 intact. No weakness or numbness in UEs or LEs.  Psychiatric:        Mood and Affect: Mood normal.        Behavior: Behavior normal.        Thought Content: Thought content normal.        Judgment: Judgment normal.     UC Treatments / Results  Labs (all labs ordered are listed, but only abnormal results are displayed) Labs Reviewed  CBG MONITORING, ED - Abnormal; Notable for the following components:      Result Value   Glucose-Capillary 157 (*)    All other components within normal limits    EKG   Radiology No results found.  Procedures Procedures (including critical care time)  Medications Ordered in UC Medications - No data to display  Initial Impression / Assessment and Plan / UC Course  I have reviewed the triage vital signs and the nursing notes.  Pertinent labs & imaging results that were available during my care of the patient were reviewed by me and considered in my medical decision making (see chart for details).     This patient is a very pleasant 41 y.o. year old female presenting with lightheadedness x2 days.  Reassuring neuro exam. Chart review shows similar episode after alcohol intoxication 04/2021. Last ETOH was 08/26/21. History diabetes. Nonfasting CBG 157. Does not take medications for this as diabetes under control per pt.  BP 121/86, has not yet taken her BP medications today. EKG mildly bradycardic but otherwise normal sinus rhythm. Chart review shows multiple similar readings / bradycardia.   Overall reassuring exam. Suspect mildly dehydrated based on presentation today. Low suspicion for inner ear pathology given no recent URI, no ear pain, dizziness is not reproducible with movement of head, etc. Rec good hydration.   Strict ED return precautions  discussed. Patient verbalizes understanding and agreement.   Coding Level 4 for review of past notes/labs, order and interpretation of labs today, and prescription drug management   Final Clinical Impressions(s) / UC Diagnoses   Final diagnoses:  Dizziness  History of hysterectomy     Discharge Instructions      -Drink plenty of fluids -I think that your symptoms will resolve in the next few days. If symptoms change like severe headaches, weakness, worsening dizziness, vision changes, etc - head to the ED. -If symptoms persist - follow-up with your primary care doctor.     ED Prescriptions   None    PDMP not reviewed this encounter.   Hazel Sams, PA-C 08/29/21 1306

## 2021-08-29 NOTE — ED Triage Notes (Signed)
Pt states she has not taken her BP medicine this morning.

## 2021-08-29 NOTE — ED Notes (Signed)
Pt states she just had a bag of chips.

## 2021-10-13 ENCOUNTER — Other Ambulatory Visit: Payer: Self-pay

## 2021-10-13 ENCOUNTER — Encounter (HOSPITAL_COMMUNITY): Payer: Self-pay

## 2021-10-13 ENCOUNTER — Emergency Department (HOSPITAL_BASED_OUTPATIENT_CLINIC_OR_DEPARTMENT_OTHER): Payer: Medicaid Other

## 2021-10-13 ENCOUNTER — Emergency Department (HOSPITAL_COMMUNITY)
Admission: EM | Admit: 2021-10-13 | Discharge: 2021-10-13 | Disposition: A | Discharge status: Home / Self Care | Payer: Medicaid Other | Attending: Emergency Medicine | Admitting: Emergency Medicine

## 2021-10-13 DIAGNOSIS — M79601 Pain in right arm: Secondary | ICD-10-CM | POA: Diagnosis not present

## 2021-10-13 DIAGNOSIS — R001 Bradycardia, unspecified: Secondary | ICD-10-CM | POA: Insufficient documentation

## 2021-10-13 MED ORDER — ACETAMINOPHEN 500 MG PO TABS
1000.0000 mg | ORAL_TABLET | Freq: Once | ORAL | Status: AC
  Administered 2021-10-13: 1000 mg via ORAL
  Filled 2021-10-13: qty 2

## 2021-10-13 MED ORDER — IBUPROFEN 400 MG PO TABS
600.0000 mg | ORAL_TABLET | Freq: Once | ORAL | Status: AC
  Administered 2021-10-13: 600 mg via ORAL
  Filled 2021-10-13: qty 1

## 2021-10-13 MED ORDER — MELOXICAM 15 MG PO TABS: 15.0000 mg | ORAL_TABLET | Freq: Every day | ORAL | 0 refills | Status: AC

## 2021-10-13 NOTE — ED Notes (Signed)
Pt transported to Vascular for vascular ultrasound.  ?

## 2021-10-13 NOTE — Progress Notes (Signed)
Upper extremity venous duplex has been completed.  ? ?Preliminary results in CV Proc.  ? ?Terri Bradford ?10/13/2021 10:47 AM    ?

## 2021-10-13 NOTE — Discharge Instructions (Addendum)
You were seen today for right arm pain. Your workup shows no sign of blood clot in the right arm. It also shows no sign of heart involvement on your EKG. Your ultrasound was also negative. This is likely musculoskeletal in nature. I will provide Meloxicam as an antiinflammatory along with a short course of muscle relaxants to be taken as needed. Do not take NSAID medication while taking the Meloxicam. Stop use if you think you may be pregnant. I recommend follow up with your PCP or orthopedics if the arm pain continues. I have provided the information for the orthopedic provider on call. Return to the emergency department if you develop life threatening conditions such as chest pain or shortness of breath.  ?

## 2021-10-13 NOTE — ED Triage Notes (Signed)
Pt BIB EMS from home due to right arm pain. Pt denies any cp or sob. Pt report she woke up around 0450 with arm pain. Pt reports its painful with movement.  ?

## 2021-10-13 NOTE — ED Provider Notes (Signed)
?[facility] ?Provider Note ? ? ?CSN: [id number] ?Arrival date & time: 10/13/21  1021 ? ?  ? ?History ? ?Chief Complaint  ?Patient presents with  ? Arm Pain  ? ? ?Terri Bradford is a 41 y.o. female. The patient presents to the emergency department via EMS due to right arm pain that began at 4:30AM today. The patient states that when she woke up she had pain in the middle of the right arm near the elbow which is worse with flexion. She denies any known injury. She denies working out or lifting heavy objects yesterday. She denies chest pain, denies shortness of breath, denies nausea. The patient has no history of cancer. No recent surgery.  ? ?HPI ? ?  ? ?Home Medications ?Prior to Admission medications   ?Medication Sig Start Date End Date Taking? Authorizing Provider  ?meloxicam (MOBIC) 15 MG tablet Take 1 tablet (15 mg total) by mouth daily for 15 days. 10/13/21 10/28/21 Yes Dorothyann Peng, PA-C  ?amLODipine-benazepril (LOTREL) 5-20 MG capsule Take 1 capsule by mouth daily. 03/26/21   [provider]  ?Blood Pressure Monitoring (BLOOD PRESSURE KIT) DEVI 1 kit by Does not apply route once a week. Check Blood Pressure regularly and record readings into the [employer] App.  Large Cuff.  DX O90.0 ?Patient not taking: No sig reported 12/26/19   Woodroe Mode, MD  ?metFORMIN (GLUCOPHAGE) 500 MG tablet Take 1 tablet (500 mg total) by mouth 2 (two) times daily with a meal. ?Patient not taking: Reported on 08/13/2020 01/31/20 09/28/20  Chauncey Mann, MD  ?NIFEdipine (ADALAT CC) 60 MG 24 hr tablet Take 1 tablet (60 mg total) by mouth 2 (two) times daily. 07/04/20 09/28/20  Donnamae Jude, MD  ?   ? ?Allergies    ?Patient has no known allergies.   ? ?Review of Systems   ?Review of Systems  ?Constitutional:  Negative for fever.  ?Respiratory:  Negative for shortness of breath.   ?Cardiovascular:  Negative for chest pain.  ?Gastrointestinal:  Negative for abdominal pain.   ?Musculoskeletal:  Positive for myalgias (Right generalized arm pain from mid upper arm to mid forearm). Negative for joint swelling.  ?Skin:  Negative for color change.  ? ?Physical Exam ?Updated Vital Signs ?BP 127/82 (BP Location: Left Arm)   Pulse 60   Temp 98.4 ?F (36.9 ?C) (Oral)   Resp 18   LMP 10/13/2019   SpO2 100%  ?Physical Exam ?Constitutional:   ?   General: She is not in acute distress. ?   Appearance: She is normal weight.  ?HENT:  ?   Head: Normocephalic.  ?Eyes:  ?   Conjunctiva/sclera: Conjunctivae normal.  ?Cardiovascular:  ?   Rate and Rhythm: Normal rate and regular rhythm.  ?   Pulses: Normal pulses.  ?   Comments: Strong radial pulse with brisk cap refill on right arm ?Pulmonary:  ?   Effort: Pulmonary effort is normal.  ?   Breath sounds: Normal breath sounds.  ?Musculoskeletal:     ?   General: No tenderness.  ?   Comments: Full range of motion in right arm. No tenderness to palpation. No warmth or erythema noted. Patient complains of pain with full flexion.  ?Skin: ?   General: Skin is warm and dry.  ?   Capillary Refill: Capillary refill takes less than 2 seconds.  ?   Findings: No bruising or erythema.  ?Neurological:  ?   Mental Status: She  is alert.  ? ? ?ED Results / Procedures / Treatments   ?Labs ?(all labs ordered are listed, but only abnormal results are displayed) ?Labs Reviewed - No data to display ? ?EKG ?EKG Interpretation ? ?Date/Time:  Tuesday October 13 2021 08:02:03 EDT ?Ventricular Rate:  52 ?PR Interval:  138 ?QRS Duration: 68 ?QT Interval:  420 ?QTC Calculation: 390 ?R Axis:   81 ?Text Interpretation: Sinus bradycardia Septal infarct , age undetermined Abnormal ECG When compared with ECG of 29-Aug-2021 12:18, No significant change since last tracing Confirmed by Gareth Morgan [phone]) on 10/13/2021 8:08:10 AM ? ?Radiology ?UE VENOUS DUPLEX (7am - 7pm) ? ?Result Date: 10/13/2021 ?UPPER VENOUS STUDY  Patient Name:  Terri Bradford [facility]  Date of Exam:   10/13/2021 Medical Rec  #: [id number]      Accession #:    [id number] Date of Birth: [birth year]      Patient Gender: F Patient Age:   79 years Exam Location:  [facility] Procedure:      VAS [place] UPPER EXTREMITY VENOUS DUPLEX Referring Phys: Cherlynn June --------------------------------------------------------------------------------  Indications: Pain Comparison Study: no prior Performing Technologist: Archie Patten RVS  Examination Guidelines: A complete evaluation includes B-mode imaging, spectral Doppler, color Doppler, and power Doppler as needed of all accessible portions of each vessel. Bilateral testing is considered an integral part of a complete examination. Limited examinations for reoccurring indications may be performed as noted.  Right Findings: +----------+------------+---------+-----------+----------+-------+ RIGHT     CompressiblePhasicitySpontaneousPropertiesSummary +----------+------------+---------+-----------+----------+-------+ IJV           Full       Yes       Yes                      +----------+------------+---------+-----------+----------+-------+ Subclavian    Full       Yes       Yes                      +----------+------------+---------+-----------+----------+-------+ Axillary      Full       Yes       Yes                      +----------+------------+---------+-----------+----------+-------+ Brachial      Full       Yes       Yes                      +----------+------------+---------+-----------+----------+-------+ Radial        Full                                          +----------+------------+---------+-----------+----------+-------+ Ulnar         Full                                          +----------+------------+---------+-----------+----------+-------+ Cephalic      Full                                          +----------+------------+---------+-----------+----------+-------+ Basilic       Full                                           +----------+------------+---------+-----------+----------+-------+  Left Findings: +----------+------------+---------+-----------+----------+-------+ LEFT      CompressiblePhasicitySpontaneousPropertiesSummary +----------+------------+---------+-----------+----------+-------+ Subclavian    Full       Yes       Yes                      +----------+------------+---------+-----------+----------+-------+  Summary:  Right: No evidence of deep vein thrombosis in the upper extremity. No evidence of superficial vein thrombosis in the upper extremity.  Left: No evidence of thrombosis in the subclavian.  *See table(s) above for measurements and observations.    Preliminary    ? ?Procedures ?Procedures  ? ? ?Medications Ordered in ED ?Medications  ?ibuprofen (ADVIL) tablet 600 mg (600 mg Oral Given 10/13/21 0814)  ?acetaminophen (TYLENOL) tablet 1,000 mg (1,000 mg Oral Given 10/13/21 0814)  ? ? ?ED Course/ Medical Decision Making/ A&P ?Clinical Course as of 10/13/21 1130  ?Tue Oct 13, 2021  ?1016 Patient on schedule for 11AM for venous duplex study [LM]  ?  ?Clinical Course User Index ?[LM] Dorothyann Peng, PA-C  ? ?                        ?Medical Decision Making ?Risk ?OTC drugs. ?Prescription drug management. ? ? ?The patient presents with right arm pain. Differential diagnosis includes musculoskeletal issue, DVT, cervical radiculopathy, ACS. The patient has had no recent surgery, no cancer, is not known to be pregnant. The arm is not swollen and there is no shortness of breath. There are no apparent risk factors for DVT and I have very low clinical suspicion. DVT study was negative.  The patient does not complain of numbness or weakness that would make me think of cervical radiculopathy. There is no chest pain, no nausea. EKG shows no ischemic changes. Very low clinical suspicion for ACS. Chest x-ray is clear. This is most likely a musculoskeletal issue. There is no indication for admission.  Discharge home. If her pain continues she may follow up with her PCP or orthopedics if needed ? ?Final Clinical Impression(s) / ED Diagnoses ?Final diagnoses:  ?Right arm pain  ? ? ?Rx / DC Orders ?ED Discharge Orders   ? ?      Ordered  ?  meloxicam (MOBIC) 15 MG tablet  Daily       ? 10/13/21 1117  ?

## 2021-11-12 ENCOUNTER — Ambulatory Visit (HOSPITAL_COMMUNITY)
Admission: EM | Admit: 2021-11-12 | Discharge: 2021-11-12 | Disposition: A | Discharge status: Home / Self Care | Payer: Medicaid Other

## 2021-11-12 NOTE — ED Notes (Signed)
Pt called from the waiting room twice. No answer  ?

## 2022-01-17 ENCOUNTER — Encounter (HOSPITAL_COMMUNITY): Payer: Self-pay | Admitting: Emergency Medicine

## 2022-01-17 ENCOUNTER — Other Ambulatory Visit: Payer: Self-pay

## 2022-01-17 ENCOUNTER — Emergency Department (HOSPITAL_COMMUNITY)
Admission: EM | Admit: 2022-01-17 | Discharge: 2022-01-17 | Disposition: A | Discharge status: Home / Self Care | Payer: Medicaid Other | Attending: Emergency Medicine | Admitting: Emergency Medicine

## 2022-01-17 DIAGNOSIS — E876 Hypokalemia: Secondary | ICD-10-CM | POA: Insufficient documentation

## 2022-01-17 DIAGNOSIS — R6883 Chills (without fever): Secondary | ICD-10-CM | POA: Diagnosis not present

## 2022-01-17 LAB — COMPREHENSIVE METABOLIC PANEL
ALT: 18 U/L (ref 0–44)
AST: 19 U/L (ref 15–41)
Albumin: 3.8 g/dL (ref 3.5–5.0)
Alkaline Phosphatase: 65 U/L (ref 38–126)
Anion gap: 13 (ref 5–15)
BUN: 11 mg/dL (ref 6–20)
CO2: 20 mmol/L — ABNORMAL LOW (ref 22–32)
Calcium: 9.2 mg/dL (ref 8.9–10.3)
Chloride: 102 mmol/L (ref 98–111)
Creatinine, Ser: 0.86 mg/dL (ref 0.44–1.00)
GFR, Estimated: >60 mL/min (ref >60)
Glucose, Bld: 165 mg/dL — ABNORMAL HIGH (ref 70–99)
Potassium: 2.9 mmol/L — ABNORMAL LOW (ref 3.5–5.1)
Sodium: 135 mmol/L (ref 135–145)
Total Bilirubin: 0.9 mg/dL (ref 0.3–1.2)
Total Protein: 7.1 g/dL (ref 6.5–8.1)

## 2022-01-17 LAB — HEMOGLOBIN A1C
Hgb A1c MFr Bld: 6.3 % — ABNORMAL HIGH (ref 4.8–5.6)
Mean Plasma Glucose: 134.11 mg/dL

## 2022-01-17 LAB — CBC WITH DIFFERENTIAL/PLATELET
Abs Immature Granulocytes: 0.02 10*3/uL (ref 0.00–0.07)
Basophils Absolute: 0 10*3/uL (ref 0.0–0.1)
Basophils Relative: 0 %
Eosinophils Absolute: 0 10*3/uL (ref 0.0–0.5)
Eosinophils Relative: 1 %
HCT: 45.5 % (ref 36.0–46.0)
Hemoglobin: 16.2 g/dL — ABNORMAL HIGH (ref 12.0–15.0)
Immature Granulocytes: 0 %
Lymphocytes Relative: 48 %
Lymphs Abs: 3.6 10*3/uL (ref 0.7–4.0)
MCH: 32.4 pg (ref 26.0–34.0)
MCHC: 35.6 g/dL (ref 30.0–36.0)
MCV: 91 fL (ref 80.0–100.0)
Monocytes Absolute: 0.6 10*3/uL (ref 0.1–1.0)
Monocytes Relative: 8 %
Neutro Abs: 3.2 10*3/uL (ref 1.7–7.7)
Neutrophils Relative %: 43 %
Platelets: 120 10*3/uL — ABNORMAL LOW (ref 150–400)
RBC: 5 MIL/uL (ref 3.87–5.11)
RDW: 13.4 % (ref 11.5–15.5)
WBC: 7.5 10*3/uL (ref 4.0–10.5)
nRBC: 0 % (ref 0.0–0.2)

## 2022-01-17 LAB — URINALYSIS, ROUTINE W REFLEX MICROSCOPIC
Bilirubin Urine: NEGATIVE
Glucose, UA: NEGATIVE mg/dL
Hgb urine dipstick: NEGATIVE
Ketones, ur: NEGATIVE mg/dL
Leukocytes,Ua: NEGATIVE
Nitrite: NEGATIVE
Protein, ur: NEGATIVE mg/dL
Specific Gravity, Urine: 1.004 — ABNORMAL LOW (ref 1.005–1.030)
pH: 6 (ref 5.0–8.0)

## 2022-01-17 MED ORDER — POTASSIUM CHLORIDE CRYS ER 20 MEQ PO TBCR
40.0000 meq | EXTENDED_RELEASE_TABLET | Freq: Once | ORAL | Status: AC
  Administered 2022-01-17: 40 meq via ORAL
  Filled 2022-01-17: qty 2

## 2022-01-17 MED ORDER — POTASSIUM CHLORIDE CRYS ER 20 MEQ PO TBCR: 40.0000 meq | EXTENDED_RELEASE_TABLET | Freq: Every day | ORAL | 0 refills | Status: DC

## 2022-01-17 NOTE — Discharge Instructions (Addendum)
1.  At this time there is no evident cause for the chills you were experiencing prior to coming to the emergency department.  Return if you have fever, new symptoms or other concerning changes. 2.  Your potassium is a little low today.  You have been prescribed a potassium supplement to take for a week.  You should see your doctor and have a recheck on your potassium level in 1 week. 3.  Your blood sugar was higher than normal at 165 mg/dL.  You should be closely monitored for diabetes.  A lab test called an A1c was done in the emergency department.  Follow-up on these results and discuss them with your doctor.

## 2022-01-17 NOTE — ED Triage Notes (Signed)
Patient arrived with EMS from home reports feeling cold with chills this evening , denies fever ,respirations unlabored , no cough or diarrhea .

## 2022-01-17 NOTE — ED Provider Notes (Signed)
[facility] EMERGENCY DEPARTMENT Provider Note   CSN: [id number] Arrival date & time: 01/17/22  0231     History  Chief Complaint  Patient presents with   Chills    Terri Bradford is a 41 y.o. female.  HPI Patient reports she had been at a family get together in [place] yesterday.  She was riding home in the car as a passenger.  She reports she started to get chills and no other symptoms.  Now at time of evaluation in the emergency department, symptoms have completely resolved.  She reports actually resolved just as she arrived and she spent about 4 hours in the waiting room with symptoms not recurring.  Now she is wondering if she was having a panic or anxiety attack.  She denies she had any chest pain, shortness of breath, fever, cough, abdominal pain.  No recent pain burning urgency with urination.    Home Medications Prior to Admission medications   Medication Sig Start Date End Date Taking? Authorizing Provider  potassium chloride SA (KLOR-CON M) 20 MEQ tablet Take 2 tablets (40 mEq total) by mouth daily. 01/17/22  Yes Heather Mckendree, Lebron Conners, MD  amLODipine-benazepril (LOTREL) 5-20 MG capsule Take 1 capsule by mouth daily. 03/26/21   [provider]  Blood Pressure Monitoring (BLOOD PRESSURE KIT) DEVI 1 kit by Does not apply route once a week. Check Blood Pressure regularly and record readings into the Bab[employer] App.  Large Cuff.  DX O90.0 Patient not taking: No sig reported 12/26/19   Adam Phenix, MD  metFORMIN (GLUCOPHAGE) 500 MG tablet Take 1 tablet (500 mg total) by mouth 2 (two) times daily with a meal. Patient not taking: Reported on 08/13/2020 01/31/20 09/28/20  Joselyn Arrow, MD  NIFEdipine (ADALAT CC) 60 MG 24 hr tablet Take 1 tablet (60 mg total) by mouth 2 (two) times daily. 07/04/20 09/28/20  Reva Bores, MD      Allergies    Patient has no known allergies.    Review of Systems   Review of Systems 10 systems reviewed negative except  as per HPI Physical Exam Updated Vital Signs BP 120/83 (BP Location: Left Arm)   Pulse 70   Temp 98.1 F (36.7 C) (Oral)   Resp 15   LMP 10/13/2019   SpO2 100%  Physical Exam Constitutional:      Appearance: Normal appearance.  HENT:     Mouth/Throat:     Mouth: Mucous membranes are moist.     Pharynx: Oropharynx is clear.  Eyes:     Extraocular Movements: Extraocular movements intact.     Pupils: Pupils are equal, round, and reactive to light.  Cardiovascular:     Rate and Rhythm: Normal rate and regular rhythm.  Pulmonary:     Effort: Pulmonary effort is normal.     Breath sounds: Normal breath sounds.  Abdominal:     General: There is no distension.     Palpations: Abdomen is soft.     Tenderness: There is no abdominal tenderness. There is no guarding.  Musculoskeletal:        General: No swelling or tenderness. Normal range of motion.     Cervical back: Neck supple.     Right lower leg: No edema.     Left lower leg: No edema.  Skin:    General: Skin is warm and dry.     Findings: No rash.  Neurological:     General: No focal deficit present.  Mental Status: She is alert and oriented to person, place, and time.     Motor: No weakness.     Coordination: Coordination normal.  Psychiatric:        Mood and Affect: Mood normal.     ED Results / Procedures / Treatments   Labs (all labs ordered are listed, but only abnormal results are displayed) Labs Reviewed  CBC WITH DIFFERENTIAL/PLATELET - Abnormal; Notable for the following components:      Result Value   Hemoglobin 16.2 (*)    Platelets 120 (*)    All other components within normal limits  COMPREHENSIVE METABOLIC PANEL - Abnormal; Notable for the following components:   Potassium 2.9 (*)    CO2 20 (*)    Glucose, Bld 165 (*)    All other components within normal limits  URINALYSIS, ROUTINE W REFLEX MICROSCOPIC - Abnormal; Notable for the following components:   Color, Urine STRAW (*)    Specific  Gravity, Urine 1.004 (*)    All other components within normal limits  HEMOGLOBIN A1C - Abnormal; Notable for the following components:   Hgb A1c MFr Bld 6.3 (*)    All other components within normal limits    EKG None  Radiology No results found.  Procedures Procedures    Medications Ordered in ED Medications  potassium chloride SA (KLOR-CON M) CR tablet 40 mEq (40 mEq Oral Given 01/17/22 0746)    ED Course/ Medical Decision Making/ A&P                           Medical Decision Making Amount and/or Complexity of Data Reviewed Labs: ordered.  Risk Prescription drug management.   Patient had self-limited chills for a brief period of time late in the evening.  No other associated symptoms.  Clinically patient is well in appearance.  Basic diagnostic evaluation done by triage.  No signs of acute infection.  No leukocytosis.  Patient has mild hypokalemia.  At this time possibly due to hyperventilation if patient was having anxiety attack earlier prior to arrival.  Possibly dietary as remainder of labs do not show other electrolyte abnormality.  Patient has had some chronic thrombocytopenia and hemoconcentration.  At this time with symptoms resolved and patient well in appearance plan will be to replace potassium and she is counseled on following up with her PCP for recheck within a week.  Patient's blood sugar was at 165.  I reviewed with her the possibility of early diabetes.  Patient did have gestational diabetes in the past.  I have added an A1c and she is counseled to review this in MyChart and review the results with her physician patient voices understanding.  Return precautions provided and patient is discharged in good condition and asymptomatic.        Final Clinical Impression(s) / ED Diagnoses Final diagnoses:  Chills without fever  Hypokalemia    Rx / DC Orders ED Discharge Orders          Ordered    potassium chloride SA (KLOR-CON M) 20 MEQ tablet  Daily         01/17/22 3662              Charlesetta Shanks, MD 01/17/22 [id number]

## 2022-02-17 ENCOUNTER — Ambulatory Visit (HOSPITAL_COMMUNITY)
Admission: EM | Admit: 2022-02-17 | Discharge: 2022-02-17 | Disposition: A | Discharge status: Home / Self Care | Payer: Medicaid Other

## 2022-02-17 ENCOUNTER — Encounter (HOSPITAL_COMMUNITY): Payer: Self-pay

## 2022-02-17 DIAGNOSIS — R42 Dizziness and giddiness: Secondary | ICD-10-CM

## 2022-02-17 LAB — POCT URINALYSIS DIPSTICK, ED / UC
Bilirubin Urine: NEGATIVE
Glucose, UA: NEGATIVE mg/dL
Hgb urine dipstick: NEGATIVE
Ketones, ur: NEGATIVE mg/dL
Nitrite: NEGATIVE
Protein, ur: NEGATIVE mg/dL
Specific Gravity, Urine: 1.025 (ref 1.005–1.030)
Urobilinogen, UA: 1 mg/dL (ref 0.0–1.0)
pH: 6 (ref 5.0–8.0)

## 2022-02-17 LAB — CBG MONITORING, ED: Glucose-Capillary: 138 mg/dL — ABNORMAL HIGH (ref 70–99)

## 2022-02-17 MED ORDER — MECLIZINE HCL 12.5 MG PO TABS: 12.5000 mg | ORAL_TABLET | Freq: Three times a day (TID) | ORAL | 0 refills | Status: DC | PRN

## 2022-02-17 MED ORDER — AMLODIPINE BESY-BENAZEPRIL HCL 5-20 MG PO CAPS: 1.0000 | ORAL_CAPSULE | Freq: Every day | ORAL | 1 refills | Status: DC

## 2022-02-17 MED ORDER — BLOOD PRESSURE KIT DEVI: 1.0000 | 0 refills | Status: AC

## 2022-02-17 NOTE — ED Provider Notes (Signed)
[place]    CSN: [id number] Arrival date & time: 02/17/22  1320     History   Chief Complaint Chief Complaint  Patient presents with   Dizziness    HPI Terri Bradford is a 41 y.o. female.  Presents with a couple month history of dizziness.  Reports symptoms occur with movement of the head.  Denies any headache, vision changes, falls, abdominal pain, vomiting/diarrhea. No chest pain, shortness of breath, palpitations.  Remote history of a couple episodes of dizziness in the past.  Most recent January of this year, thought to be mild dehydration. Reports she does not drink much water. Episode before that was due to alcohol use. She reports some alcohol use and notices dizziness occurs with this.   Has been taking Antivert without much relief of symptoms.  History of sinus bradycardia on EKGs History of high blood pressure for which she takes Lotrel.  History diabetes. Last A1c was 6.3 one month ago. Does not take medicine or check sugars at home. S/p hysterectomy. Denies any vaginal bleeding or spotting.  Low potassium on CMP one month ago was 2.9. Has not been taking the potassium pills. Hemoglobin at that time was normal.  Past Medical History:  Diagnosis Date   Anxiety    Blood transfusion without reported diagnosis    Diabetes mellitus without complication ([place])    Gestational diabetes    Hypertension    Pregnancy induced hypertension    Thrombocytopenia, unspecified ([place])    Thrombocytopenia    Patient Active Problem List   Diagnosis Date Noted   S/P emergency cesarean hysterectomy 07/04/2020   Thrombocytopenia ([place]) 01/04/2020   History of intrauterine fetal death in previous pregnancy 01-27-2020   Chronic hypertension in pregnancy 01-27-2020   History of cesarean section 01/27/2020   AMA (advanced maternal age) multigravida 35+ 01/27/2020   Supervision of high risk pregnancy, antepartum 12/26/2019   Type 2 diabetes mellitus in pregnancy, second  trimester 12/12/2014    Past Surgical History:  Procedure Laterality Date   ABDOMINAL HYSTERECTOMY Bilateral 06/30/2020   Procedure: HYSTERECTOMY ABDOMINAL;  Surgeon: Aletha Halim, MD;  Location: [facility] LD ORS;  Service: Obstetrics;  Laterality: Bilateral;   CESAREAN SECTION     CESAREAN SECTION N/A 06/30/2020   Procedure: CESAREAN SECTION;  Surgeon: Aletha Halim, MD;  Location: [facility] LD ORS;  Service: Obstetrics;  Laterality: N/A;   CYSTOSCOPY N/A 06/30/2020   Procedure: CYSTOSCOPY;  Surgeon: Aletha Halim, MD;  Location: [facility] LD ORS;  Service: Obstetrics;  Laterality: N/A;    OB History     Gravida  4   Para  4   Term  4   Preterm  0   AB  0   Living  3      SAB  0   IAB  0   Ectopic  0   Multiple  0   Live Births  3            Home Medications    Prior to Admission medications   Medication Sig Start Date End Date Taking? Authorizing Provider  meclizine (ANTIVERT) 12.5 MG tablet Take 1 tablet (12.5 mg total) by mouth 3 (three) times daily as needed for dizziness. 02/17/22  Yes Saria Haran, Wells Guiles, PA-C  amLODipine-benazepril (LOTREL) 5-20 MG capsule Take 1 capsule by mouth daily. 02/17/22   Lynette Topete, Wells Guiles, PA-C  Blood Pressure Monitoring (BLOOD PRESSURE KIT) DEVI 1 kit by Does not apply route once a week. Check Blood Pressure regularly and record  readings into the Babys[employer]s App.  Large Cuff.  DX O90.0 02/17/22   Shaneen Reeser, Wells Guiles, PA-C  potassium chloride SA (KLOR-CON M) 20 MEQ tablet Take 2 tablets (40 mEq total) by mouth daily. 01/17/22   Charlesetta Shanks, MD  metFORMIN (GLUCOPHAGE) 500 MG tablet Take 1 tablet (500 mg total) by mouth 2 (two) times daily with a meal. Patient not taking: Reported on 08/13/2020 01/31/20 09/28/20  Chauncey Mann, MD  NIFEdipine (ADALAT CC) 60 MG 24 hr tablet Take 1 tablet (60 mg total) by mouth 2 (two) times daily. 07/04/20 09/28/20  Donnamae Jude, MD    Family History Family History  Problem Relation Age of Onset   Hypertension  Mother    COPD Mother    Anxiety disorder Sister    Diabetes Sister     Social History Social History   Tobacco Use   Smoking status: Every Day    Packs/day: 0.25    Types: Cigarettes   Smokeless tobacco: Never  Vaping Use   Vaping Use: Never used  Substance Use Topics   Alcohol use: Yes    Comment: occ   Drug use: No     Allergies   Patient has no known allergies.   Review of Systems Review of Systems  Neurological:  Positive for dizziness.   Per HPI  Physical Exam Triage Vital Signs ED Triage Vitals  Enc Vitals Group     BP 02/17/22 1357 137/90     Pulse Rate 02/17/22 1357 66     Resp 02/17/22 1357 16     Temp 02/17/22 1357 98.6 F (37 C)     Temp Source 02/17/22 1357 Oral     SpO2 02/17/22 1357 97 %     Weight 02/17/22 1359 129 lb 6.6 oz (58.7 kg)     Height 02/17/22 1359 _0  (1.6 m)     Head Circumference --      Peak Flow --      Pain Score 02/17/22 1359 0     Pain Loc --      Pain Edu? --      Excl. in [place]? --    No data found.  Updated Vital Signs BP 137/90 (BP Location: Left Arm)   Pulse 66   Temp 98.6 F (37 C) (Oral)   Resp 16   Ht _1  (1.6 m)   Wt 129 lb 6.6 oz (58.7 kg)   LMP 10/13/2019   SpO2 97%   BMI 22.92 kg/m    Physical Exam Vitals and nursing note reviewed.  Constitutional:      General: She is not in acute distress. HENT:     Head: Normocephalic and atraumatic.     Mouth/Throat:     Mouth: Mucous membranes are moist.     Pharynx: Oropharynx is clear.  Eyes:     Extraocular Movements: Extraocular movements intact.     Conjunctiva/sclera: Conjunctivae normal.     Pupils: Pupils are equal, round, and reactive to light.  Cardiovascular:     Rate and Rhythm: Normal rate and regular rhythm.     Pulses: Normal pulses.     Heart sounds: Normal heart sounds.  Pulmonary:     Effort: Pulmonary effort is normal. No respiratory distress.     Breath sounds: Normal breath sounds.  Abdominal:     Palpations: Abdomen is  soft.     Tenderness: There is no abdominal tenderness.  Musculoskeletal:        General: Normal  range of motion.     Cervical back: Normal range of motion.  Neurological:     General: No focal deficit present.     Mental Status: She is alert and oriented to person, place, and time.     Cranial Nerves: Cranial nerves 2-12 are intact. No facial asymmetry.     Sensory: Sensation is intact. No sensory deficit.     Motor: Motor function is intact. No weakness.     Coordination: Coordination is intact. Coordination normal.     Gait: Gait is intact. Gait normal.     UC Treatments / Results  Labs (all labs ordered are listed, but only abnormal results are displayed) Labs Reviewed  CBG MONITORING, ED - Abnormal; Notable for the following components:      Result Value   Glucose-Capillary 138 (*)    All other components within normal limits  POCT URINALYSIS DIPSTICK, ED / UC - Abnormal; Notable for the following components:   Leukocytes,Ua SMALL (*)    All other components within normal limits    EKG  Radiology No results found.  Procedures Procedures   Medications Ordered in UC Medications - No data to display  Initial Impression / Assessment and Plan / UC Course  I have reviewed the triage vital signs and the nursing notes.  Pertinent labs & imaging results that were available during my care of the patient were reviewed by me and considered in my medical decision making (see chart for details).  Blood glucose 138. EKG normal sinus, ventricular rate of 66 bpm. Urinalysis negative apart from small leuks. No urinary symptoms.  Attempted Epley maneuver on both sides but patient declines dizziness during visit today. Not likely vestibular neuritis, no recent illness. No hearing loss, less likely meniere's. Not likely ACS. Could be related to decreased fluid intake. Patient relates it may be due to stress.  Vitals are stable, blood pressure 137/90. Neurologically intact.   Physical exam is unremarkable. I recommend follow-up with primary care provider regarding persistent dizziness even with Antivert medicine. Added to PCP assistance list. Recommend increase fluid intake to stay hydrated, check sugars and blood pressure at home.  Refilled BP medicine, sent in new kit. Return precautions discussed. Patient agrees to plan and is discharged in stable condition.  Final Clinical Impressions(s) / UC Diagnoses   Final diagnoses:  Dizziness     Discharge Instructions      Increase your fluid intake to at least 64 oz daily.  Try the meclizine as needed for vertigo symptoms.  I recommend trying the Epley maneuver at home.  Please follow up with primary care if symptoms persist. Please go to the emergency department if symptoms worsen.     ED Prescriptions     Medication Sig Dispense Auth. Provider   meclizine (ANTIVERT) 12.5 MG tablet Take 1 tablet (12.5 mg total) by mouth 3 (three) times daily as needed for dizziness. 30 tablet Edelmiro Innocent, PA-C   amLODipine-benazepril (LOTREL) 5-20 MG capsule Take 1 capsule by mouth daily. 30 capsule Chyann Ambrocio, PA-C   Blood Pressure Monitoring (BLOOD PRESSURE KIT) DEVI 1 kit by Does not apply route once a week. Check Blood Pressure regularly and record readings into the Babyscripts App.  Large Cuff.  DX O90.0 1 each Naveya Ellerman, Wells Guiles, PA-C      PDMP not reviewed this encounter.   Jannette Cotham, Wells Guiles, PA-C 02/17/22 1500

## 2022-02-17 NOTE — Discharge Instructions (Addendum)
Increase your fluid intake to at least 64 oz daily.  Try the meclizine as needed for vertigo symptoms.  I recommend trying the Epley maneuver at home.  Please follow up with primary care if symptoms persist. Please go to the emergency department if symptoms worsen.

## 2022-02-17 NOTE — ED Triage Notes (Signed)
Patient dizzy and light headed for a few months. Patient states she is on blood pressure medication and for dizziness but it is not helping. Dizziness onset with movement.   Patient would like to have her sugar checked as well.

## 2022-03-17 ENCOUNTER — Ambulatory Visit: Payer: Medicaid Other | Admitting: Physician Assistant

## 2022-12-13 ENCOUNTER — Emergency Department (HOSPITAL_COMMUNITY)
Admission: EM | Admit: 2022-12-13 | Discharge: 2022-12-14 | Disposition: A | Discharge status: Home / Self Care | Payer: Medicaid Other | Attending: Student | Admitting: Student

## 2022-12-13 ENCOUNTER — Other Ambulatory Visit: Payer: Self-pay

## 2022-12-13 ENCOUNTER — Emergency Department (HOSPITAL_COMMUNITY): Payer: Medicaid Other

## 2022-12-13 DIAGNOSIS — I10 Essential (primary) hypertension: Secondary | ICD-10-CM | POA: Insufficient documentation

## 2022-12-13 DIAGNOSIS — Z79899 Other long term (current) drug therapy: Secondary | ICD-10-CM | POA: Diagnosis not present

## 2022-12-13 DIAGNOSIS — E876 Hypokalemia: Secondary | ICD-10-CM | POA: Diagnosis not present

## 2022-12-13 DIAGNOSIS — D696 Thrombocytopenia, unspecified: Secondary | ICD-10-CM | POA: Insufficient documentation

## 2022-12-13 DIAGNOSIS — E119 Type 2 diabetes mellitus without complications: Secondary | ICD-10-CM | POA: Insufficient documentation

## 2022-12-13 DIAGNOSIS — R0789 Other chest pain: Secondary | ICD-10-CM | POA: Insufficient documentation

## 2022-12-13 LAB — URINALYSIS, ROUTINE W REFLEX MICROSCOPIC
Bacteria, UA: NONE SEEN
Bilirubin Urine: NEGATIVE
Glucose, UA: NEGATIVE mg/dL
Hgb urine dipstick: NEGATIVE
Ketones, ur: NEGATIVE mg/dL
Nitrite: NEGATIVE
Protein, ur: NEGATIVE mg/dL
Specific Gravity, Urine: 1.011 (ref 1.005–1.030)
pH: 7 (ref 5.0–8.0)

## 2022-12-13 LAB — PREGNANCY, URINE: Preg Test, Ur: NEGATIVE

## 2022-12-13 LAB — BASIC METABOLIC PANEL
Anion gap: 11 (ref 5–15)
BUN: 9 mg/dL (ref 6–20)
CO2: 24 mmol/L (ref 22–32)
Calcium: 9.1 mg/dL (ref 8.9–10.3)
Chloride: 99 mmol/L (ref 98–111)
Creatinine, Ser: 0.81 mg/dL (ref 0.44–1.00)
GFR, Estimated: >60 mL/min (ref >60)
Glucose, Bld: 138 mg/dL — ABNORMAL HIGH (ref 70–99)
Potassium: 2.9 mmol/L — ABNORMAL LOW (ref 3.5–5.1)
Sodium: 134 mmol/L — ABNORMAL LOW (ref 135–145)

## 2022-12-13 LAB — CBC
HCT: 44.8 % (ref 36.0–46.0)
Hemoglobin: 15.4 g/dL — ABNORMAL HIGH (ref 12.0–15.0)
MCH: 31.7 pg (ref 26.0–34.0)
MCHC: 34.4 g/dL (ref 30.0–36.0)
MCV: 92.2 fL (ref 80.0–100.0)
Platelets: 129 10*3/uL — ABNORMAL LOW (ref 150–400)
RBC: 4.86 MIL/uL (ref 3.87–5.11)
RDW: 12.8 % (ref 11.5–15.5)
WBC: 8.7 10*3/uL (ref 4.0–10.5)
nRBC: 0 % (ref 0.0–0.2)

## 2022-12-13 LAB — TROPONIN I (HIGH SENSITIVITY): Troponin I (High Sensitivity): 3 ng/L (ref <18)

## 2022-12-13 NOTE — ED Triage Notes (Signed)
Patient coming to ED for evaluation of chest pain.  Reports "I jumped up too fast at work around 4 or 5. I got lightheaded and dizzy and then my chest started hurting."  States dizziness has improved.  No reports of shortness of breath, jaw pain, or arm pain.  No reports of cardiac hx.

## 2022-12-13 NOTE — ED Provider Notes (Signed)
[place] EMERGENCY DEPARTMENT AT [facility] Provider Note   CSN: [id number] Arrival date & time: 12/13/22  2105     History  Chief Complaint  Patient presents with   Chest Pain   Dizziness    Peri CALEY INDOVINA is a 42 y.o. female.  With past medical history of diabetes, hypertension, thrombocytopenia who presents to the emergency department with chest pain.  States today about 5 PM she had a sudden onset of central chest pain.  She describes it as sharp and intermittent.  It is nonradiating.  She states this lasted for maybe a minute and went away.  She felt mildly lightheaded during this episode which resolved as well after about a minute.  She denies having any associated symptoms like palpitations, shortness of breath, dizziness, syncope.  She denies having any GERD symptoms.  She does have a history of hypertension and takes lisinopril which she states she has been compliant with.  She otherwise denies having any cough or fever or recent illnesses.  She denies any trauma to her chest.     Chest Pain Dizziness Associated symptoms: chest pain        Home Medications Prior to Admission medications   Medication Sig Start Date End Date Taking? Authorizing Provider  amLODipine-benazepril (LOTREL) 5-20 MG capsule Take 1 capsule by mouth daily. 02/17/22   Rising, Lurena Joiner, PA-C  Blood Pressure Monitoring (BLOOD PRESSURE KIT) DEVI 1 kit by Does not apply route once a week. Check Blood Pressure regularly and record readings into the Babyscripts App.  Large Cuff.  DX O90.0 02/17/22   Rising, Lurena Joiner, PA-C  meclizine (ANTIVERT) 12.5 MG tablet Take 1 tablet (12.5 mg total) by mouth 3 (three) times daily as needed for dizziness. 02/17/22   Rising, Lurena Joiner, PA-C  potassium chloride SA (KLOR-CON M) 20 MEQ tablet Take 2 tablets (40 mEq total) by mouth daily. 01/17/22   Arby Barrette, MD  metFORMIN (GLUCOPHAGE) 500 MG tablet Take 1 tablet (500 mg total) by mouth 2 (two) times daily with  a meal. Patient not taking: Reported on 08/13/2020 01/31/20 09/28/20  Joselyn Arrow, MD  NIFEdipine (ADALAT CC) 60 MG 24 hr tablet Take 1 tablet (60 mg total) by mouth 2 (two) times daily. 07/04/20 09/28/20  Reva Bores, MD      Allergies    Patient has no known allergies.    Review of Systems   Review of Systems  Cardiovascular:  Positive for chest pain.  Neurological:  Positive for light-headedness.  All other systems reviewed and are negative.   Physical Exam Updated Vital Signs BP 112/86 (BP Location: Right Arm)   Pulse 67   Temp 98.6 F (37 C) (Oral)   Resp 17   Ht 5\' 2"  (1.575 m)   Wt 59 kg   LMP 10/13/2019   SpO2 100%   BMI 23.78 kg/m  Physical Exam Vitals and nursing note reviewed.  Constitutional:      General: She is not in acute distress.    Appearance: Normal appearance. She is well-developed. She is not ill-appearing or toxic-appearing.  HENT:     Head: Normocephalic.  Eyes:     General: No scleral icterus.    Pupils: Pupils are equal, round, and reactive to light.  Cardiovascular:     Rate and Rhythm: Normal rate and regular rhythm.     Pulses:          Radial pulses are 2+ on the right side and 2+ on the  left side.     Heart sounds: Normal heart sounds. No murmur heard. Pulmonary:     Effort: Pulmonary effort is normal. No tachypnea.     Breath sounds: Normal breath sounds.  Chest:     Chest wall: No tenderness.  Abdominal:     General: Bowel sounds are normal.     Palpations: Abdomen is soft.  Musculoskeletal:     Cervical back: Normal range of motion.     Right lower leg: No tenderness.     Left lower leg: No tenderness.  Skin:    General: Skin is warm and dry.     Capillary Refill: Capillary refill takes less than 2 seconds.  Neurological:     General: No focal deficit present.     Mental Status: She is alert and oriented to person, place, and time.  Psychiatric:        Mood and Affect: Mood normal.        Behavior: Behavior normal.      ED Results / Procedures / Treatments   Labs (all labs ordered are listed, but only abnormal results are displayed) Labs Reviewed  BASIC METABOLIC PANEL - Abnormal; Notable for the following components:      Result Value   Sodium 134 (*)    Potassium 2.9 (*)    Glucose, Bld 138 (*)    All other components within normal limits  CBC - Abnormal; Notable for the following components:   Hemoglobin 15.4 (*)    Platelets 129 (*)    All other components within normal limits  URINALYSIS, ROUTINE W REFLEX MICROSCOPIC - Abnormal; Notable for the following components:   Leukocytes,Ua TRACE (*)    All other components within normal limits  PREGNANCY, URINE  TROPONIN I (HIGH SENSITIVITY)  TROPONIN I (HIGH SENSITIVITY)    EKG None  Radiology DG Chest 2 View  Result Date: 12/13/2022 CLINICAL DATA:  chest pain EXAM: CHEST - 2 VIEW COMPARISON:  Chest x-ray 09/28/2020 FINDINGS: The heart and mediastinal contours are within normal limits. Right lower lung zone airspace opacity that may be due to overlapping soft tissues and osseous structures. No correlation on lateral view. No focal consolidation. No pulmonary edema. No pleural effusion. No pneumothorax. No acute osseous abnormality. IMPRESSION: Right lower lung zone airspace opacity that may be due to overlapping soft tissues and osseous structures. Consider repeat PA frontal view with nipple markers. Electronically Signed   By: Tish Frederickson M.D.   On: 12/13/2022 22:45    Procedures Procedures   Medications Ordered in ED Medications - No data to display  ED Course/ Medical Decision Making/ A&P   {   Click here for ABCD2, HEART and other calculatorsREFRESH Note before signing :1}                          Medical Decision Making Amount and/or Complexity of Data Reviewed Labs: ordered. Radiology: ordered.  Initial Impression and Ddx 42 year old female who presents to the emergency department with chest pain. Patient PMH that  increases complexity of ED encounter: Hypertension, thrombocytopenia, diabetes Differential: Acute chest syndrome, stable angina, atypical angina, pulmonary embolism, pneumothorax, aortic dissection, pleural effusion, CHF, COPD, asthma, myocarditis, pericarditis, cardiac tamponade, chest wall pain   Interpretation of Diagnostics I independent reviewed and interpreted the labs as followed: K 2.9, platelets 129 near baseline, negative pregnancy, UA negative, troponin 3 -> ***  - I independently visualized the following imaging with scope of interpretation limited  to determining acute life threatening conditions related to emergency care: Chest x-ray, which revealed right lower lung airspace opacity which could be soft tissues or osseous structures.  Patient Reassessment and Ultimate Disposition/Management Overall well-appearing female, nonseptic and nontoxic appearing presents with chest pain.  Not currently having chest pain. Exam without any acute findings.    Patient management required discussion with the following services or consulting groups:  {BEROCONSULT:26841}  Complexity of Problems Addressed {BEROCOPA:26833}  Additional Data Reviewed and Analyzed Further history obtained from: {BERODATA:26834}  Patient Encounter Risk Assessment {BERORISK:26838}  Final Clinical Impression(s) / ED Diagnoses Final diagnoses:  None    Rx / DC Orders ED Discharge Orders     None

## 2022-12-14 LAB — TROPONIN I (HIGH SENSITIVITY): Troponin I (High Sensitivity): 3 ng/L (ref <18)

## 2022-12-14 MED ORDER — POTASSIUM CHLORIDE CRYS ER 20 MEQ PO TBCR
40.0000 meq | EXTENDED_RELEASE_TABLET | Freq: Once | ORAL | Status: AC
  Administered 2022-12-14: 40 meq via ORAL
  Filled 2022-12-14: qty 2

## 2022-12-14 NOTE — Discharge Instructions (Addendum)
You were seen in the emergency department today for chest pain. Your work up here was reassuring. Please return for worsening symptoms. Otherwise please follow-up with your primary care provider.

## 2023-03-25 ENCOUNTER — Emergency Department (HOSPITAL_COMMUNITY): Payer: Medicaid Other

## 2023-03-25 ENCOUNTER — Emergency Department (HOSPITAL_COMMUNITY): Admission: EM | Admit: 2023-03-25 | Payer: Medicaid Other | Source: Home / Self Care

## 2023-03-25 ENCOUNTER — Other Ambulatory Visit: Payer: Self-pay

## 2023-03-25 DIAGNOSIS — E119 Type 2 diabetes mellitus without complications: Secondary | ICD-10-CM | POA: Insufficient documentation

## 2023-03-25 DIAGNOSIS — R051 Acute cough: Secondary | ICD-10-CM

## 2023-03-25 DIAGNOSIS — I10 Essential (primary) hypertension: Secondary | ICD-10-CM | POA: Diagnosis not present

## 2023-03-25 DIAGNOSIS — F17210 Nicotine dependence, cigarettes, uncomplicated: Secondary | ICD-10-CM | POA: Insufficient documentation

## 2023-03-25 DIAGNOSIS — R079 Chest pain, unspecified: Secondary | ICD-10-CM | POA: Diagnosis present

## 2023-03-25 LAB — CBC
HCT: 46.1 % — ABNORMAL HIGH (ref 36.0–46.0)
Hemoglobin: 16.1 g/dL — ABNORMAL HIGH (ref 12.0–15.0)
MCH: 32.9 pg (ref 26.0–34.0)
MCHC: 34.9 g/dL (ref 30.0–36.0)
MCV: 94.1 fL (ref 80.0–100.0)
Platelets: 93 10*3/uL — ABNORMAL LOW (ref 150–400)
RBC: 4.9 MIL/uL (ref 3.87–5.11)
RDW: 12.7 % (ref 11.5–15.5)
WBC: 4.8 10*3/uL (ref 4.0–10.5)
nRBC: 0 % (ref 0.0–0.2)

## 2023-03-25 LAB — BASIC METABOLIC PANEL
Anion gap: 19 — ABNORMAL HIGH (ref 5–15)
BUN: 9 mg/dL (ref 6–20)
CO2: 24 mmol/L (ref 22–32)
Calcium: 9.4 mg/dL (ref 8.9–10.3)
Chloride: 95 mmol/L — ABNORMAL LOW (ref 98–111)
Creatinine, Ser: 0.98 mg/dL (ref 0.44–1.00)
GFR, Estimated: >60 mL/min (ref >60)
Glucose, Bld: 154 mg/dL — ABNORMAL HIGH (ref 70–99)
Potassium: 3 mmol/L — ABNORMAL LOW (ref 3.5–5.1)
Sodium: 138 mmol/L (ref 135–145)

## 2023-03-25 LAB — PROTIME-INR
INR: 1 (ref 0.8–1.2)
Prothrombin Time: 12.8 s (ref 11.4–15.2)

## 2023-03-25 LAB — TROPONIN I (HIGH SENSITIVITY): Troponin I (High Sensitivity): 5 ng/L (ref <18)

## 2023-03-25 NOTE — ED Triage Notes (Signed)
Patient reports intermittent central chest pain / palpitations with dry cough for 2 days , no emesis or diaphoresis .

## 2023-03-26 LAB — TROPONIN I (HIGH SENSITIVITY): Troponin I (High Sensitivity): 5 ng/L (ref <18)

## 2023-03-26 MED ORDER — BENZONATATE 100 MG PO CAPS: 100.0000 mg | ORAL_CAPSULE | Freq: Three times a day (TID) | ORAL | 0 refills | Status: DC

## 2023-03-26 MED ORDER — NAPROXEN 500 MG PO TABS: 500.0000 mg | ORAL_TABLET | Freq: Two times a day (BID) | ORAL | 0 refills | Status: DC

## 2023-03-26 NOTE — Discharge Instructions (Signed)
You were evaluated in the Emergency Department and after careful evaluation, we did not find any emergent condition requiring admission or further testing in the hospital.  Your exam/testing today is overall reassuring.  Symptoms likely due to bronchitis.  Take the Naprosyn twice daily for pain.  Use the Tessalon to help with your cough.  Plenty of fluids and rest at home.  Please return to the Emergency Department if you experience any worsening of your condition.   Thank you for allowing [place] to be a part of your care.

## 2023-03-26 NOTE — ED Provider Notes (Signed)
[facility] Emergency Department Provider Note MRN:  [id number]  Arrival date & time: 03/26/23     Chief Complaint   Chest Pain   History of Present Illness   Terri Bradford is a 42 y.o. year-old female with a history of diabetes, hypertension presenting to the ED with chief complaint of chest pain.  Persistent dry cough for the past 3 days.  Having some central chest pain while coughing.  No dizziness or diaphoresis, no nausea or vomiting, no trouble breathing.  No leg pain or swelling.  No history of blood clots.  Review of Systems  A thorough review of systems was obtained and all systems are negative except as noted in the HPI and PMH.   Patient's Health History    Past Medical History:  Diagnosis Date   Anxiety    Blood transfusion without reported diagnosis    Diabetes mellitus without complication (HCC)    Gestational diabetes    Hypertension    Pregnancy induced hypertension    Thrombocytopenia, unspecified (HCC)    Thrombocytopenia    Past Surgical History:  Procedure Laterality Date   ABDOMINAL HYSTERECTOMY Bilateral 06/30/2020   Procedure: HYSTERECTOMY ABDOMINAL;  Surgeon: [place] Bing, MD;  Location: [facility];  Service: Obstetrics;  Laterality: Bilateral;   CESAREAN SECTION     CESAREAN SECTION N/A 06/30/2020   Procedure: CESAREAN SECTION;  Surgeon: [place] Bing, MD;  Location: [facility];  Service: Obstetrics;  Laterality: N/A;   CYSTOSCOPY N/A 06/30/2020   Procedure: CYSTOSCOPY;  Surgeon: [place] Bing, MD;  Location: [facility];  Service: Obstetrics;  Laterality: N/A;    Family History  Problem Relation Age of Onset   Hypertension Mother    COPD Mother    Anxiety disorder Sister    Diabetes Sister     Social History   Socioeconomic History   Marital status: Single    Spouse name: Not on file   Number of children: 2   Years of education: Not on file   Highest education level: Not on file  Occupational History    Occupation: unemployed  Tobacco Use   Smoking status: Every Day    Current packs/day: 0.25    Types: Cigarettes   Smokeless tobacco: Never  Vaping Use   Vaping status: Never Used  Substance and Sexual Activity   Alcohol use: Yes    Comment: occ   Drug use: No   Sexual activity: Yes    Partners: Male    Birth control/protection: None  Other Topics Concern   Not on file  Social History Narrative   Not on file   Social Determinants of Health   Financial Resource Strain: Not on file  Food Insecurity: Not on file  Transportation Needs: Not on file  Physical Activity: Not on file  Stress: Not on file  Social Connections: Not on file  Intimate Partner Violence: Not on file     Physical Exam   Vitals:   03/26/23 0142 03/26/23 0200  BP:  106/79  Pulse: 84 74  Resp: 16 17  Temp: 98.4 F (36.9 C)   SpO2: 97% 99%    CONSTITUTIONAL: Well-appearing, NAD NEURO/PSYCH:  Alert and oriented x 3, no focal deficits EYES:  eyes equal and reactive ENT/NECK:  no LAD, no JVD CARDIO: Regular rate, well-perfused, normal S1 and S2 PULM:  CTAB no wheezing or rhonchi GI/GU:  non-distended, non-tender MSK/SPINE:  No gross deformities, no edema SKIN:  no rash, atraumatic   *  Additional and/or pertinent findings included in MDM below  Diagnostic and Interventional Summary    EKG Interpretation Date/Time:  Friday March 25 2023 22:04:12 EDT Ventricular Rate:  90 PR Interval:  132 QRS Duration:  60 QT Interval:  342 QTC Calculation: 418 R Axis:   52  Text Interpretation: Normal sinus rhythm Septal infarct , age undetermined Abnormal ECG When compared with ECG of 13-Dec-2022 21:22, PREVIOUS ECG IS PRESENT Confirmed by Kennis Carina [phone]) on 03/26/2023 1:59:44 AM       Labs Reviewed  BASIC METABOLIC PANEL - Abnormal; Notable for the following components:      Result Value   Potassium 3.0 (*)    Chloride 95 (*)    Glucose, Bld 154 (*)    Anion gap 19 (*)    All other  components within normal limits  CBC - Abnormal; Notable for the following components:   Hemoglobin 16.1 (*)    HCT 46.1 (*)    Platelets 93 (*)    All other components within normal limits  PROTIME-INR  TROPONIN I (HIGH SENSITIVITY)  TROPONIN I (HIGH SENSITIVITY)    DG Chest 2 View  Final Result      Medications - No data to display   Procedures  /  Critical Care Procedures  ED Course and Medical Decision Making  Initial Impression and Ddx Suspect MSK or cough related chest pain.  Pneumonia is considered.  Favoring bronchitis.  Low concern for ACS.  Highly doubt PE.  PERC negative.  Past medical/surgical history that increases complexity of ED encounter: Diabetes and hypertension  Interpretation of Diagnostics I personally reviewed the EKG and my interpretation is as follows: Sinus rhythm, nonspecific ST segment findings similar to prior  Labs reassuring with no significant blood count or electrolyte disturbance.  Troponin negative x 2.  Chest x-ray normal  Patient Reassessment and Ultimate Disposition/Management     Discharge  Patient management required discussion with the following services or consulting groups:  None  Complexity of Problems Addressed Acute illness or injury that poses threat of life of bodily function  Additional Data Reviewed and Analyzed Further history obtained from: Prior labs/imaging results  Additional Factors Impacting ED Encounter Risk Prescriptions  Elmer Sow. Pilar Plate, MD [facility] Emergency Medicine [facility] [email or web address]  Final Clinical Impressions(s) / ED Diagnoses     ICD-10-CM   1. Chest pain, unspecified type  R07.9     2. Acute cough  R05.1       ED Discharge Orders          Ordered    naproxen (NAPROSYN) 500 MG tablet  2 times daily        03/26/23 0214    benzonatate (TESSALON) 100 MG capsule  Every 8 hours        03/26/23 0214             Discharge Instructions Discussed with and  Provided to Patient:    Discharge Instructions      You were evaluated in the Emergency Department and after careful evaluation, we did not find any emergent condition requiring admission or further testing in the hospital.  Your exam/testing today is overall reassuring.  Symptoms likely due to bronchitis.  Take the Naprosyn twice daily for pain.  Use the Tessalon to help with your cough.  Plenty of fluids and rest at home.  Please return to the Emergency Department if you experience any worsening of your condition.   Thank you for allowing  [place] to be a part of your care.      Sabas Sous, MD 03/26/23 [phone]

## 2023-04-20 ENCOUNTER — Ambulatory Visit (HOSPITAL_COMMUNITY)
Admission: EM | Admit: 2023-04-20 | Discharge: 2023-04-20 | Disposition: A | Discharge status: Home / Self Care | Payer: Medicaid Other | Attending: Family Medicine | Admitting: Family Medicine

## 2023-04-20 ENCOUNTER — Encounter (HOSPITAL_COMMUNITY): Payer: Self-pay | Admitting: *Deleted

## 2023-04-20 ENCOUNTER — Other Ambulatory Visit: Payer: Self-pay

## 2023-04-20 DIAGNOSIS — B354 Tinea corporis: Secondary | ICD-10-CM

## 2023-04-20 DIAGNOSIS — K047 Periapical abscess without sinus: Secondary | ICD-10-CM

## 2023-04-20 MED ORDER — CLOTRIMAZOLE-BETAMETHASONE 1-0.05 % EX CREA: TOPICAL_CREAM | CUTANEOUS | 0 refills | Status: DC

## 2023-04-20 MED ORDER — AMOXICILLIN-POT CLAVULANATE 875-125 MG PO TABS: 1.0000 | ORAL_TABLET | Freq: Two times a day (BID) | ORAL | 0 refills | Status: DC

## 2023-04-20 NOTE — ED Triage Notes (Signed)
Pt reports a lump on upper chest ,dental swelling and Rash on Rt buttocks.

## 2023-04-20 NOTE — Discharge Instructions (Signed)
Follow-up with PCP regarding concern of diabetes.  Treating for dental infection take antibiotics as prescribed.  For the rash on your buttocks Lotrisone cream twice daily for 5 days.  Then as needed.  Did not see feel any mass or abnormality of the upper chest wall.  If you continue to have concerns follow-up with your primary care provider.

## 2023-04-20 NOTE — ED Provider Notes (Signed)
[facility]    CSN: [id number] Arrival date & time: 04/20/23  1750      History   Chief Complaint Chief Complaint  Patient presents with   Rash   Mass   Oral Swelling    HPI Terri Bradford is a 42 y.o. female.   Patient presents today with concern of a enlarged area on her clavicle.  She denies any tenderness with palpation but reports she felt that it has swollen after she had been sleeping on the couch.  She is also concerned for a rash on her bilateral buttocks.  Fears that she may have caught an infection from using her friend's toilet.  Also endorses oral pain.  She reports that the left side of her face is tender and she has pain in the lower posterior area of her mouth where her wisdom tooth is present.  Symptoms have been present for a few days. Past Medical History:  Diagnosis Date   Anxiety    Blood transfusion without reported diagnosis    Diabetes mellitus without complication (HCC)    Gestational diabetes    Hypertension    Pregnancy induced hypertension    Thrombocytopenia, unspecified (HCC)    Thrombocytopenia    Patient Active Problem List   Diagnosis Date Noted   S/P emergency cesarean hysterectomy 07/04/2020   Thrombocytopenia (HCC) 01/04/2020   History of intrauterine fetal death in previous pregnancy 01-20-2020   Chronic hypertension in pregnancy 2020/01/20   History of cesarean section 2020/01/20   AMA (advanced maternal age) multigravida 35+ 20-Jan-2020   Supervision of high risk pregnancy, antepartum 12/26/2019   Type 2 diabetes mellitus in pregnancy, second trimester 12/12/2014    Past Surgical History:  Procedure Laterality Date   ABDOMINAL HYSTERECTOMY Bilateral 06/30/2020   Procedure: HYSTERECTOMY ABDOMINAL;  Surgeon: [place] Bing, MD;  Location: [facility];  Service: Obstetrics;  Laterality: Bilateral;   CESAREAN SECTION     CESAREAN SECTION N/A 06/30/2020   Procedure: CESAREAN SECTION;  Surgeon: [place] Bing, MD;   Location[facility];  Service: Obstetrics;  Laterality: N/A;   CYSTOSCOPY N/A 06/30/2020   Procedure: CYSTOSCOPY;  Surgeon: [place] Bing, MD;  Location: [facility];  Service: Obstetrics;  Laterality: N/A;    OB History     Gravida  4   Para  4   Term  4   Preterm  0   AB  0   Living  3      SAB  0   IAB  0   Ectopic  0   Multiple  0   Live Births  3            Home Medications    Prior to Admission medications   Medication Sig Start Date End Date Taking? Authorizing Provider  amoxicillin-clavulanate (AUGMENTIN) 875-125 MG tablet Take 1 tablet by mouth every 12 (twelve) hours. 04/20/23  Yes Bing Neighbors, NP  clotrimazole-betamethasone (LOTRISONE) cream Apply to affected area 2 times daily prn x5 day. Repeat as needed 04/20/23  Yes Bing Neighbors, NP  amLODipine-benazepril (LOTREL) 5-20 MG capsule Take 1 capsule by mouth daily. 02/17/22   Rising, Lurena Joiner, PA-C  benzonatate (TESSALON) 100 MG capsule Take 1 capsule (100 mg total) by mouth every 8 (eight) hours. 03/26/23   Sabas Sous, MD  Blood Pressure Monitoring (BLOOD PRESSURE KIT) DEVI 1 kit by Does not apply route once a week. Check Blood Pressure regularly and record readings into the Babys[employer] App.  Large Cuff.  DX O90.0 02/17/22   Rising, Lurena Joiner, PA-C  meclizine (ANTIVERT) 12.5 MG tablet Take 1 tablet (12.5 mg total) by mouth 3 (three) times daily as needed for dizziness. 02/17/22   Rising, Lurena Joiner, PA-C  naproxen (NAPROSYN) 500 MG tablet Take 1 tablet (500 mg total) by mouth 2 (two) times daily. 03/26/23   Sabas Sous, MD  potassium chloride SA (KLOR-CON M) 20 MEQ tablet Take 2 tablets (40 mEq total) by mouth daily. 01/17/22   Arby Barrette, MD  metFORMIN (GLUCOPHAGE) 500 MG tablet Take 1 tablet (500 mg total) by mouth 2 (two) times daily with a meal. Patient not taking: Reported on 08/13/2020 01/31/20 09/28/20  Joselyn Arrow, MD  NIFEdipine (ADALAT CC) 60 MG 24 hr tablet Take 1 tablet (60 mg  total) by mouth 2 (two) times daily. 07/04/20 09/28/20  Reva Bores, MD    Family History Family History  Problem Relation Age of Onset   Hypertension Mother    COPD Mother    Anxiety disorder Sister    Diabetes Sister     Social History Social History   Tobacco Use   Smoking status: Every Day    Current packs/day: 0.25    Types: Cigarettes   Smokeless tobacco: Never  Vaping Use   Vaping status: Never Used  Substance Use Topics   Alcohol use: Yes    Comment: occ   Drug use: No     Allergies   Patient has no known allergies.   Review of Systems Review of Systems  Skin:  Positive for rash.     Physical Exam Triage Vital Signs ED Triage Vitals  Encounter Vitals Group     BP 04/20/23 1905 134/87     Systolic BP Percentile --      Diastolic BP Percentile --      Pulse Rate 04/20/23 1905 71     Resp 04/20/23 1905 18     Temp 04/20/23 1905 98.3 F (36.8 C)     Temp src --      SpO2 04/20/23 1905 97 %     Weight --      Height --      Head Circumference --      Peak Flow --      Pain Score 04/20/23 1903 6     Pain Loc --      Pain Education --      Exclude from Growth Chart --    No data found.  Updated Vital Signs BP 134/87   Pulse 71   Temp 98.3 F (36.8 C)   Resp 18   LMP 10/13/2019   SpO2 97%   Visual Acuity Right Eye Distance:   Left Eye Distance:   Bilateral Distance:    Right Eye Near:   Left Eye Near:    Bilateral Near:     Physical Exam Exam conducted with a chaperone present.  Constitutional:      Appearance: Normal appearance.  HENT:     Head: Normocephalic and atraumatic.     Mouth/Throat:     Dentition: Abnormal dentition. Dental tenderness and gingival swelling present.   Eyes:     Extraocular Movements: Extraocular movements intact.     Pupils: Pupils are equal, round, and reactive to light.  Cardiovascular:     Rate and Rhythm: Normal rate and regular rhythm.  Pulmonary:     Effort: Pulmonary effort is normal.      Breath sounds: Normal breath sounds.  Chest:    Genitourinary:    Comments: External buttocks and dry scaly rash bilaterally Skin:    General: Skin is warm and dry.  Neurological:     General: No focal deficit present.     Mental Status: She is alert.      UC Treatments / Results  Labs (all labs ordered are listed, but only abnormal results are displayed) Labs Reviewed - No data to display  EKG   Radiology No results found.  Procedures Procedures (including critical care time)  Medications Ordered in UC Medications - No data to display  Initial Impression / Assessment and Plan / UC Course  I have reviewed the triage vital signs and the nursing notes.  Pertinent labs & imaging results that were available during my care of the patient were reviewed by me and considered in my medical decision making (see chart for details).    Tinea corporis buttocks, Lotrisone cream twice daily x 5 days then as needed. Dental infection treatment with Augmentin twice daily for 7 days.  Encourage patient to follow-up with a dentist as that wisdom tooth will need to be removed.  On evaluation of chest there is no mass, swelling or abnormality.  Patient encouraged to follow-up with primary care provider if she continues to have concerns. Final Clinical Impressions(s) / UC Diagnoses   Final diagnoses:  Tinea corporis buttocks  Dental infection     Discharge Instructions      Follow-up with PCP regarding concern of diabetes.  Treating for dental infection take antibiotics as prescribed.  For the rash on your buttocks Lotrisone cream twice daily for 5 days.  Then as needed.  Did not see feel any mass or abnormality of the upper chest wall.  If you continue to have concerns follow-up with your primary care provider.     ED Prescriptions     Medication Sig Dispense Auth. Provider   amoxicillin-clavulanate (AUGMENTIN) 875-125 MG tablet Take 1 tablet by mouth every 12 (twelve) hours.  14 tablet Bing Neighbors, NP   clotrimazole-betamethasone (LOTRISONE) cream Apply to affected area 2 times daily prn x5 day. Repeat as needed 90 g Bing Neighbors, NP      PDMP not reviewed this encounter.   Bing Neighbors, NP 04/20/23 [phone]

## 2023-05-22 ENCOUNTER — Encounter (HOSPITAL_COMMUNITY): Payer: Self-pay

## 2023-05-22 ENCOUNTER — Ambulatory Visit (HOSPITAL_COMMUNITY)
Admission: EM | Admit: 2023-05-22 | Discharge: 2023-05-22 | Disposition: A | Discharge status: Home / Self Care | Payer: Medicaid Other | Attending: Internal Medicine | Admitting: Internal Medicine

## 2023-05-22 DIAGNOSIS — T7840XA Allergy, unspecified, initial encounter: Secondary | ICD-10-CM

## 2023-05-22 NOTE — ED Provider Notes (Signed)
[facility]    CSN: [id number] Arrival date & time: 05/22/23  1244      History   Chief Complaint Chief Complaint  Patient presents with   Facial Swelling    HPI Andie MELONIE WENZLER is a 42 y.o. female.   HPI While eating a spicy beef stick 2 hours ago developed a burning sensation in her throat, spit out the beef stick and did not finish eating it.  She then developed swollen area under her chin. Denies change in voice, swelling of lips or tongue, wheezing, shortness of breath, hives or other rash.  Denies history of prior allergic reaction.  She did drink a Sprite after the incident.  States her symptoms have improved still has a little burning in the lump under her jaw.  Past Medical History:  Diagnosis Date   Anxiety    Blood transfusion without reported diagnosis    Diabetes mellitus without complication (HCC)    Gestational diabetes    Hypertension    Pregnancy induced hypertension    Thrombocytopenia, unspecified (HCC)    Thrombocytopenia    Patient Active Problem List   Diagnosis Date Noted   S/P emergency cesarean hysterectomy 07/04/2020   Thrombocytopenia (HCC) 01/04/2020   History of intrauterine fetal death in previous pregnancy 01/18/20   Chronic hypertension in pregnancy 01/18/2020   History of cesarean section 01/18/2020   AMA (advanced maternal age) multigravida 35+ January 18, 2020   Supervision of high risk pregnancy, antepartum 12/26/2019   Type 2 diabetes mellitus in pregnancy, second trimester 12/12/2014    Past Surgical History:  Procedure Laterality Date   ABDOMINAL HYSTERECTOMY Bilateral 06/30/2020   Procedure: HYSTERECTOMY ABDOMINAL;  Surgeon: [place] Bing, MD;  Location[facility];  Service: Obstetrics;  Laterality: Bilateral;   CESAREAN SECTION     CESAREAN SECTION N/A 06/30/2020   Procedure: CESAREAN SECTION;  Surgeon: [place] Bing, MD;  Location: [facility];  Service: Obstetrics;  Laterality: N/A;   CYSTOSCOPY N/A 06/30/2020    Procedure: CYSTOSCOPY;  Surgeon: [place] Bing, MD;  Location: [facility];  Service: Obstetrics;  Laterality: N/A;    OB History     Gravida  4   Para  4   Term  4   Preterm  0   AB  0   Living  3      SAB  0   IAB  0   Ectopic  0   Multiple  0   Live Births  3            Home Medications    Prior to Admission medications   Medication Sig Start Date End Date Taking? Authorizing Provider  amLODipine-benazepril (LOTREL) 5-20 MG capsule Take 1 capsule by mouth daily. 02/17/22   Rising, Lurena Joiner, PA-C  Blood Pressure Monitoring (BLOOD PRESSURE KIT) DEVI 1 kit by Does not apply route once a week. Check Blood Pressure regularly and record readings into the [employer] App.  Large Cuff.  DX O90.0 02/17/22   Rising, Lurena Joiner, PA-C  metFORMIN (GLUCOPHAGE) 500 MG tablet Take 1 tablet (500 mg total) by mouth 2 (two) times daily with a meal. Patient not taking: Reported on 08/13/2020 01/31/20 09/28/20  Joselyn Arrow, MD  NIFEdipine (ADALAT CC) 60 MG 24 hr tablet Take 1 tablet (60 mg total) by mouth 2 (two) times daily. 07/04/20 09/28/20  Reva Bores, MD    Family History Family History  Problem Relation Age of Onset   Hypertension Mother    COPD  Mother    Anxiety disorder Sister    Diabetes Sister     Social History Social History   Tobacco Use   Smoking status: Every Day    Current packs/day: 0.25    Types: Cigarettes   Smokeless tobacco: Never  Vaping Use   Vaping status: Never Used  Substance Use Topics   Alcohol use: Yes    Comment: occ   Drug use: No     Allergies   Patient has no known allergies.   Review of Systems Review of Systems  Constitutional:  Negative for appetite change.  HENT:  Negative for trouble swallowing and voice change.   Respiratory:  Negative for chest tightness, shortness of breath and wheezing.   Gastrointestinal:  Negative for abdominal pain, diarrhea and vomiting.     Physical Exam Triage Vital Signs ED Triage  Vitals [05/22/23 1259]  Encounter Vitals Group     BP 128/67     Systolic BP Percentile      Diastolic BP Percentile      Pulse Rate 79     Resp 18     Temp 98.2 F (36.8 C)     Temp Source Oral     SpO2 98 %     Weight      Height      Head Circumference      Peak Flow      Pain Score 0     Pain Loc      Pain Education      Exclude from Growth Chart    No data found.  Updated Vital Signs BP 128/67 (BP Location: Left Arm)   Pulse 79   Temp 98.2 F (36.8 C) (Oral)   Resp 18   LMP 10/13/2019   SpO2 98%   Visual Acuity Right Eye Distance:   Left Eye Distance:   Bilateral Distance:    Right Eye Near:   Left Eye Near:    Bilateral Near:     Physical Exam Vitals and nursing note reviewed.  Constitutional:      Appearance: She is not ill-appearing.  HENT:     Head: Normocephalic and atraumatic.     Right Ear: Tympanic membrane and ear canal normal.     Left Ear: Tympanic membrane and ear canal normal.  Cardiovascular:     Rate and Rhythm: Normal rate and regular rhythm.  Pulmonary:     Effort: Pulmonary effort is normal. No respiratory distress.     Breath sounds: Normal breath sounds.  Musculoskeletal:     Cervical back: Neck supple. No tenderness.  Lymphadenopathy:     Cervical: Cervical adenopathy present.  Skin:    General: Skin is warm and dry.  Neurological:     Mental Status: She is alert and oriented to person, place, and time.      UC Treatments / Results  Labs (all labs ordered are listed, but only abnormal results are displayed) Labs Reviewed - No data to display  EKG   Radiology No results found.  Procedures Procedures (including critical care time)  Medications Ordered in UC Medications - No data to display  Initial Impression / Assessment and Plan / UC Course  I have reviewed the triage vital signs and the nursing notes.  Pertinent labs & imaging results that were available during my care of the patient were reviewed by me  and considered in my medical decision making (see chart for details).     42 year old female and itching  in throat after eating a beef stick associated with swelling under her jaw.  She is well-appearing, clear voice, normal swallowing, no swelling of the uvula tongue or lips, lungs are clear to auscultation she does not have any wheezing.  No rashes or hives.  Mild anterior cervical enlargement without exudate.  No visible swelling on neck.  Discussed with patient possible allergic reaction recommend over-the-counter antihistamine such as Claritin, Allegra or Zyrtec, strict ED precautions reviewed with patient Final Clinical Impressions(s) / UC Diagnoses   Final diagnoses:  None   Discharge Instructions   None    ED Prescriptions   None    PDMP not reviewed this encounter.   Meliton Rattan, [place] 05/22/23 1329

## 2023-05-22 NOTE — Discharge Instructions (Signed)
Take an over-the-counter antihistamine such as Claritin, Allegra or Zyrtec as directed on the package for the next 24 hours If your symptoms become severe you develop swelling of the lips, tongue, occultly swallowing or difficulty breathing call 911

## 2023-05-22 NOTE — ED Triage Notes (Signed)
Pt states she ate a "beef stick" this morning and her throat started itching. States she now has swelling to the right side of her neck. Denies any difficulty breathing.

## 2023-10-09 ENCOUNTER — Ambulatory Visit (HOSPITAL_COMMUNITY): Admission: EM | Admit: 2023-10-09 | Discharge: 2023-10-09 | Disposition: A | Discharge status: Home / Self Care

## 2023-10-09 ENCOUNTER — Encounter (HOSPITAL_COMMUNITY): Payer: Self-pay

## 2023-10-09 ENCOUNTER — Telehealth (HOSPITAL_COMMUNITY): Payer: Self-pay | Admitting: Emergency Medicine

## 2023-10-09 DIAGNOSIS — I10 Essential (primary) hypertension: Secondary | ICD-10-CM

## 2023-10-09 DIAGNOSIS — Z760 Encounter for issue of repeat prescription: Secondary | ICD-10-CM

## 2023-10-09 DIAGNOSIS — H60501 Unspecified acute noninfective otitis externa, right ear: Secondary | ICD-10-CM | POA: Diagnosis not present

## 2023-10-09 DIAGNOSIS — H65191 Other acute nonsuppurative otitis media, right ear: Secondary | ICD-10-CM | POA: Diagnosis not present

## 2023-10-09 MED ORDER — OFLOXACIN 0.3 % OT SOLN: 3.0000 [drp] | Freq: Two times a day (BID) | OTIC | 0 refills | Status: AC

## 2023-10-09 MED ORDER — AMOXICILLIN-POT CLAVULANATE 875-125 MG PO TABS: 1.0000 | ORAL_TABLET | Freq: Two times a day (BID) | ORAL | 0 refills | Status: DC

## 2023-10-09 MED ORDER — LISINOPRIL 20 MG PO TABS: 20.0000 mg | ORAL_TABLET | Freq: Every day | ORAL | 0 refills | Status: DC

## 2023-10-09 MED ORDER — AMOXICILLIN-POT CLAVULANATE 875-125 MG PO TABS: 1.0000 | ORAL_TABLET | Freq: Two times a day (BID) | ORAL | 0 refills | Status: AC

## 2023-10-09 MED ORDER — OFLOXACIN 0.3 % OT SOLN
3.0000 [drp] | Freq: Two times a day (BID) | OTIC | 0 refills | Status: DC
Start: 2023-10-09 — End: 2023-10-09

## 2023-10-09 MED ORDER — OFLOXACIN 0.3 % OT SOLN: 3.0000 [drp] | Freq: Two times a day (BID) | OTIC | 0 refills | Status: DC

## 2023-10-09 NOTE — ED Notes (Signed)
 Work note and patient requested the medications be sent to a different pharmacy

## 2023-10-09 NOTE — Discharge Instructions (Addendum)
 Augmentin -- 1 tablet twice daily for 5 days Take with food to avoid upset stomach. This is to treat internal ear infection  Ofloxacin - 3 drops in the right ear twice daily for 5 days This is to treat external ear infection (canal)  I have refilled your lisinopril, 30 day supply. Please call primary care provider for follow up appointment!

## 2023-10-09 NOTE — ED Triage Notes (Addendum)
 Patient c/o right ear pain, nasal congestion, and a non productive cough since yesterday.  Patient has not taken any medications for her symptoms.   Patient is also requesting a refill of Lisinopril 20 mg

## 2023-10-09 NOTE — ED Provider Notes (Addendum)
 [facility]    CSN: [id number] Arrival date & time: 10/09/23  1715      History   Chief Complaint Chief Complaint  Patient presents with   Otalgia   Nasal Congestion   Cough   Medication Refill    HPI Terri Bradford is a 43 y.o. female.  Yesterday developed runny nose, right ear pain, slight dry cough Ear pain rated 9/10 pain No fevers No interventions attempted   Also requests refill of lisinopril. Hx HTN Has a PCP, saw 3 months ago. Scheduling follow up soon  Past Medical History:  Diagnosis Date   Anxiety    Blood transfusion without reported diagnosis    Diabetes mellitus without complication (HCC)    Gestational diabetes    Hypertension    Pregnancy induced hypertension    Thrombocytopenia, unspecified (HCC)    Thrombocytopenia    Patient Active Problem List   Diagnosis Date Noted   S/P emergency cesarean hysterectomy 07/04/2020   Thrombocytopenia (HCC) 01/04/2020   History of intrauterine fetal death in previous pregnancy 01-19-2020   Chronic hypertension in pregnancy Jan 19, 2020   History of cesarean section 01-19-2020   AMA (advanced maternal age) multigravida 35+ 01-19-2020   Supervision of high risk pregnancy, antepartum 12/26/2019   Type 2 diabetes mellitus in pregnancy, second trimester 12/12/2014    Past Surgical History:  Procedure Laterality Date   ABDOMINAL HYSTERECTOMY Bilateral 06/30/2020   Procedure: HYSTERECTOMY ABDOMINAL;  Surgeon: Keota Bing, MD;  [facility]: [facility];  Service: Obstetrics;  Laterality: Bilateral;   CESAREAN SECTION     CESAREAN SECTION N/A 06/30/2020   Procedure: CESAREAN SECTION;  Surgeon: [place] Bing, MD;  Location: [facility];  Service: Obstetrics;  Laterality: N/A;   CYSTOSCOPY N/A 06/30/2020   Procedure: CYSTOSCOPY;  Surgeon: [place] Bing, MD;  [facility]: [facility];  Service: Obstetrics;  Laterality: N/A;    OB History     Gravida  4   Para  4   Term  4   Preterm  0   AB  0    Living  3      SAB  0   IAB  0   Ectopic  0   Multiple  0   Live Births  3            Home Medications    Prior to Admission medications   Medication Sig Start Date End Date Taking? Authorizing Provider  ofloxacin (FLOXIN) 0.3 % OTIC solution Place 3 drops into the right ear 2 (two) times daily for 5 days. 10/09/23 10/14/23 Yes Kahleb Mcclane, Lurena Joiner, PA-C  amoxicillin-clavulanate (AUGMENTIN) 875-125 MG tablet Take 1 tablet by mouth every 12 (twelve) hours for 5 days. 10/09/23 10/14/23  Jeron Grahn, Lurena Joiner, PA-C  Blood Pressure Monitoring (BLOOD PRESSURE KIT) DEVI 1 kit by Does not apply route once a week. Check Blood Pressure regularly and record readings into the [employer] App.  Large Cuff.  DX O90.0 02/17/22   Adana Marik, Lurena Joiner, PA-C  lisinopril (ZESTRIL) 20 MG tablet Take 1 tablet (20 mg total) by mouth daily. 10/09/23   Jonet Mathies, Lurena Joiner, PA-C  metFORMIN (GLUCOPHAGE) 500 MG tablet Take 1 tablet (500 mg total) by mouth 2 (two) times daily with a meal. Patient not taking: Reported on 08/13/2020 01/31/20 09/28/20  Joselyn Arrow, MD  NIFEdipine (ADALAT CC) 60 MG 24 hr tablet Take 1 tablet (60 mg total) by mouth 2 (two) times daily. 07/04/20 09/28/20  Reva Bores, MD    Family History  Family History  Problem Relation Age of Onset   Hypertension Mother    COPD Mother    Anxiety disorder Sister    Diabetes Sister     Social History Social History   Tobacco Use   Smoking status: Every Day    Current packs/day: 0.25    Types: Cigarettes   Smokeless tobacco: Never  Vaping Use   Vaping status: Never Used  Substance Use Topics   Alcohol use: Yes    Comment: occ   Drug use: No     Allergies   Patient has no known allergies.   Review of Systems Review of Systems Per HPI  Physical Exam Triage Vital Signs ED Triage Vitals [10/09/23 1819]  Encounter Vitals Group     BP (!) 131/92     Systolic BP Percentile      Diastolic BP Percentile      Pulse Rate 85     Resp 16     Temp  98 F (36.7 C)     Temp Source Oral     SpO2 99 %     Weight      Height      Head Circumference      Peak Flow      Pain Score 9     Pain Loc      Pain Education      Exclude from Growth Chart    No data found.  Updated Vital Signs BP (!) 131/92 (BP Location: Right Arm)   Pulse 85   Temp 98 F (36.7 C) (Oral)   Resp 16   LMP 10/13/2019   SpO2 99%    Physical Exam Vitals and nursing note reviewed.  Constitutional:      General: She is not in acute distress.    Appearance: She is not ill-appearing.  HENT:     Right Ear: Swelling and tenderness (tragal) present. Tympanic membrane is erythematous.     Left Ear: Tympanic membrane and ear canal normal.     Ears:     Comments: Canal swollen and irritated, TM is erythematous     Nose: No rhinorrhea.     Mouth/Throat:     Mouth: Mucous membranes are moist.     Pharynx: Oropharynx is clear. No posterior oropharyngeal erythema.  Eyes:     Extraocular Movements: Extraocular movements intact.     Conjunctiva/sclera: Conjunctivae normal.     Pupils: Pupils are equal, round, and reactive to light.  Cardiovascular:     Rate and Rhythm: Normal rate and regular rhythm.     Pulses: Normal pulses.     Heart sounds: Normal heart sounds.  Pulmonary:     Effort: Pulmonary effort is normal. No respiratory distress.     Breath sounds: No wheezing, rhonchi or rales.  Musculoskeletal:     Cervical back: Normal range of motion.  Skin:    General: Skin is warm and dry.  Neurological:     Mental Status: She is alert and oriented to person, place, and time.     UC Treatments / Results  Labs (all labs ordered are listed, but only abnormal results are displayed) Labs Reviewed - No data to display  EKG  Radiology No results found.  Procedures Procedures   Medications Ordered in UC Medications - No data to display  Initial Impression / Assessment and Plan / UC Course  I have reviewed the triage vital signs and the nursing  notes.  Pertinent labs & imaging results  that were available during my care of the patient were reviewed by me and considered in my medical decision making (see chart for details).  Afebrile, clear lungs. Right otitis media and externa Augmentin BID x 5 days (unilateral, non-severe) Ofloxacin Bid x 5 days   HTN BP today 131/92 Refilled lisinopril 20 mg daily Will follow with PCP  Return precautions discussed. Patient agrees to plan Note for work provided  Final Clinical Impressions(s) / UC Diagnoses   Final diagnoses:  Other non-recurrent acute nonsuppurative otitis media of right ear  Acute otitis externa of right ear, unspecified type  Medication refill  Hypertension, unspecified type     Discharge Instructions      Augmentin -- 1 tablet twice daily for 5 days Take with food to avoid upset stomach. This is to treat internal ear infection  Ofloxacin - 3 drops in the right ear twice daily for 5 days This is to treat external ear infection (canal)  I have refilled your lisinopril, 30 day supply. Please call primary care provider for follow up appointment!      ED Prescriptions     Medication Sig Dispense Auth. Provider   amoxicillin-clavulanate (AUGMENTIN) 875-125 MG tablet  (Status: Discontinued) Take 1 tablet by mouth every 12 (twelve) hours for 5 days. 10 tablet Kathee Tumlin, PA-C   ofloxacin (FLOXIN) 0.3 % OTIC solution  (Status: Discontinued) Place 3 drops into the right ear 2 (two) times daily for 5 days. 5 mL Yamilka Lopiccolo, PA-C   lisinopril (ZESTRIL) 20 MG tablet  (Status: Discontinued) Take 1 tablet (20 mg total) by mouth daily. 30 tablet Jacek Colson, PA-C   amoxicillin-clavulanate (AUGMENTIN) 875-125 MG tablet Take 1 tablet by mouth every 12 (twelve) hours for 5 days. 10 tablet Kayloni Rocco, PA-C   lisinopril (ZESTRIL) 20 MG tablet Take 1 tablet (20 mg total) by mouth daily. 30 tablet Felicity Penix, PA-C   ofloxacin (FLOXIN) 0.3 % OTIC solution   (Status: Discontinued) Place 3 drops into the right ear 2 (two) times daily for 5 days. 5 mL Katelyne Galster, PA-C   ofloxacin (FLOXIN) 0.3 % OTIC solution  (Status: Discontinued) Place 3 drops into the right ear 2 (two) times daily for 5 days. 5 mL Wyndi Northrup, PA-C   ofloxacin (FLOXIN) 0.3 % OTIC solution Place 3 drops into the right ear 2 (two) times daily for 5 days. 5 mL Kathaleen Dudziak, Lurena Joiner, PA-C      PDMP not reviewed this encounter.   Cressida Milford, Ray Church 10/09/23 1854  Original ofloxacin prescription transmission to pharmacy had failed. I have called as a verbal order and pharmacy verifies.     Marlow Baars, [place] 10/09/23 1900

## 2023-10-10 ENCOUNTER — Emergency Department (HOSPITAL_COMMUNITY)
Admission: EM | Admit: 2023-10-10 | Discharge: 2023-10-10 | Discharge status: Against Medical Advice | Attending: Emergency Medicine | Admitting: Emergency Medicine

## 2023-10-10 ENCOUNTER — Other Ambulatory Visit: Payer: Self-pay

## 2023-10-10 DIAGNOSIS — Z5321 Procedure and treatment not carried out due to patient leaving prior to being seen by health care provider: Secondary | ICD-10-CM | POA: Diagnosis not present

## 2023-10-10 DIAGNOSIS — H9201 Otalgia, right ear: Secondary | ICD-10-CM | POA: Insufficient documentation

## 2023-10-10 MED ORDER — IBUPROFEN 800 MG PO TABS
800.0000 mg | ORAL_TABLET | Freq: Once | ORAL | Status: AC
  Administered 2023-10-10: 800 mg via ORAL
  Filled 2023-10-10: qty 1

## 2023-10-10 NOTE — ED Triage Notes (Addendum)
 Pt. Stated, Ive had rt. Ear pain for 2 days. I went o UC and given ear drops and an antibiotic yesterday. Im no better. Pt requesting something for pain

## 2023-10-10 NOTE — ED Notes (Signed)
 Pt called no answer

## 2023-10-10 NOTE — ED Notes (Signed)
 Called pt no answer

## 2024-02-17 ENCOUNTER — Encounter: Payer: Self-pay | Admitting: Advanced Practice Midwife

## 2024-07-27 ENCOUNTER — Encounter (HOSPITAL_COMMUNITY): Payer: Self-pay

## 2024-07-27 ENCOUNTER — Ambulatory Visit (HOSPITAL_COMMUNITY)
Admission: EM | Admit: 2024-07-27 | Discharge: 2024-07-27 | Disposition: A | Discharge status: Home / Self Care | Attending: Internal Medicine | Admitting: Internal Medicine

## 2024-07-27 DIAGNOSIS — I10 Essential (primary) hypertension: Secondary | ICD-10-CM

## 2024-07-27 DIAGNOSIS — Z760 Encounter for issue of repeat prescription: Secondary | ICD-10-CM

## 2024-07-27 MED ORDER — LISINOPRIL-HYDROCHLOROTHIAZIDE 20-25 MG PO TABS: 1.0000 | ORAL_TABLET | Freq: Every day | ORAL | 0 refills | Status: AC

## 2024-07-27 NOTE — ED Triage Notes (Signed)
 Patient states her blood pressure has been elevated. States she ran out of her Bp meds. Has been without medication for a few days.   Needs refills of Lisinopril .

## 2024-07-27 NOTE — Discharge Instructions (Addendum)
 Your blood pressure was elevated in the clinic today. Continue taking blood pressure medication(s) as prescribed. Your BP medication has been refilled for 30 days.  Continue exercising and eating low salt diet to reduce BP naturally.  If you develop chest pain, shortness of breath, weakness on one side of your body, severe headache, dizziness, etc, please go to the ER.  Follow-up with PCP for ongoing evaluation and management of your high blood pressure.    If you develop any new or worsening symptoms or if your symptoms do not start to improve, please return here or follow-up with your primary care provider. If your symptoms are severe, please go to the emergency room.

## 2024-07-27 NOTE — ED Provider Notes (Signed)
 " [facility]    CSN: [id number] Arrival date & time: 07/27/24  1425      History   Chief Complaint Chief Complaint  Patient presents with   Medication Refill    HPI Terri Bradford is a 43 y.o. female.   Terri Bradford is a 43 y.o. female presenting for chief complaint of medication refill. History of HTN, she takes lisinopril -HCTZ 20-25 daily as prescribed by her PCP ([facility] Alan Amy). She ran out of her prescription a few days ago before the holiday and is here to have it refilled. BP mildly elevated today at 145/87. She is without complaint today and denies cough, CP, SOB, palpitations, dizziness, extremity weakness, headache, vision changes, and paresthesias. No leg swelling or orthopnea.    Medication Refill   Past Medical History:  Diagnosis Date   Anxiety    Blood transfusion without reported diagnosis    Diabetes mellitus without complication (HCC)    Gestational diabetes    Hypertension    Pregnancy induced hypertension    Thrombocytopenia, unspecified    Thrombocytopenia    Patient Active Problem List   Diagnosis Date Noted   S/P emergency cesarean hysterectomy 07/04/2020   Thrombocytopenia 01/04/2020   History of intrauterine fetal death in previous pregnancy 27-Jan-2020   Chronic hypertension in pregnancy 27-Jan-2020   History of cesarean section 01/27/20   AMA (advanced maternal age) multigravida 35+ 2020-01-27   Supervision of high risk pregnancy, antepartum 12/26/2019   Type 2 diabetes mellitus in pregnancy, second trimester 12/12/2014    Past Surgical History:  Procedure Laterality Date   ABDOMINAL HYSTERECTOMY Bilateral 06/30/2020   Procedure: HYSTERECTOMY ABDOMINAL;  Surgeon: Izell Harari, MD;  Location: [facility];  Service: Obstetrics;  Laterality: Bilateral;   CESAREAN SECTION     CESAREAN SECTION N/A 06/30/2020   Procedure: CESAREAN SECTION;  Surgeon: Izell Harari, MD;  [facility];  Service:  Obstetrics;  Laterality: N/A;   CYSTOSCOPY N/A 06/30/2020   Procedure: CYSTOSCOPY;  Surgeon: Izell Harari, MD;  [facility]: [facility];  Service: Obstetrics;  Laterality: N/A;    OB History     Gravida  4   Para  4   Term  4   Preterm  0   AB  0   Living  3      SAB  0   IAB  0   Ectopic  0   Multiple  0   Live Births  3            Home Medications    Prior to Admission medications  Medication Sig Start Date End Date Taking? Authorizing Provider  lisinopril -hydrochlorothiazide  (ZESTORETIC ) 20-25 MG tablet Take 1 tablet by mouth daily. 07/27/24  Yes Enedelia Dorna CHRISTELLA, FNP  Blood Pressure Monitoring (BLOOD PRESSURE KIT) DEVI 1 kit by Does not apply route once a week. Check Blood Pressure regularly and record readings into the [employer] App.  Large Cuff.  DX O90.0 02/17/22   Rising, [place], PA-C  metFORMIN  (GLUCOPHAGE ) 500 MG tablet Take 1 tablet (500 mg total) by mouth 2 (two) times daily with a meal. Patient not taking: Reported on 08/13/2020 01/31/20 09/28/20  Dotti Mitzie SAILOR, MD  NIFEdipine  (ADALAT  CC) 60 MG 24 hr tablet Take 1 tablet (60 mg total) by mouth 2 (two) times daily. 07/04/20 09/28/20  Fredirick Glenys RAMAN, MD    Family History Family History  Problem Relation Age of Onset   Hypertension Mother  COPD Mother    Anxiety disorder Sister    Diabetes Sister     Social History Social History[1]   Allergies   Patient has no known allergies.   Review of Systems Review of Systems Per HPI  Physical Exam Triage Vital Signs ED Triage Vitals [07/27/24 1722]  Encounter Vitals Group     BP (!) 145/87     Girls Systolic BP Percentile      Girls Diastolic BP Percentile      Boys Systolic BP Percentile      Boys Diastolic BP Percentile      Pulse Rate 64     Resp 17     Temp 98.7 F (37.1 C)     Temp Source Oral     SpO2 98 %     Weight      Height 5' 2 (1.575 m)     Head Circumference      Peak Flow      Pain Score 0     Pain Loc       Pain Education      Exclude from Growth Chart    No data found.  Updated Vital Signs BP (!) 145/87 (BP Location: Right Arm)   Pulse 64   Temp 98.7 F (37.1 C) (Oral)   Resp 17   Ht 5' 2 (1.575 m)   LMP 10/13/2019   SpO2 98%   BMI 25.61 kg/m   Visual Acuity Right Eye Distance:   Left Eye Distance:   Bilateral Distance:    Right Eye Near:   Left Eye Near:    Bilateral Near:     Physical Exam Vitals and nursing note reviewed.  Constitutional:      Appearance: She is not ill-appearing or toxic-appearing.  HENT:     Head: Normocephalic and atraumatic.     Right Ear: Hearing and external ear normal.     Left Ear: Hearing and external ear normal.     Nose: Nose normal.     Mouth/Throat:     Lips: Pink.  Eyes:     General: Lids are normal. Vision grossly intact. Gaze aligned appropriately.     Extraocular Movements: Extraocular movements intact.     Conjunctiva/sclera: Conjunctivae normal.  Cardiovascular:     Rate and Rhythm: Normal rate and regular rhythm.     Heart sounds: Normal heart sounds, S1 normal and S2 normal.  Pulmonary:     Effort: Pulmonary effort is normal. No respiratory distress.     Breath sounds: Normal breath sounds and air entry.  Musculoskeletal:     Cervical back: Neck supple.  Skin:    General: Skin is warm and dry.     Capillary Refill: Capillary refill takes less than 2 seconds.     Findings: No rash.  Neurological:     General: No focal deficit present.     Mental Status: She is alert and oriented to person, place, and time. Mental status is at baseline.     Cranial Nerves: No dysarthria or facial asymmetry.  Psychiatric:        Mood and Affect: Mood normal.        Speech: Speech normal.        Behavior: Behavior normal.        Thought Content: Thought content normal.        Judgment: Judgment normal.      UC Treatments / Results  Labs (all labs ordered are listed, but only abnormal results  are displayed) Labs Reviewed - No  data to display  EKG   Radiology No results found.  Procedures Procedures (including critical care time)  Medications Ordered in UC Medications - No data to display  Initial Impression / Assessment and Plan / UC Course  I have reviewed the triage vital signs and the nursing notes.  Pertinent labs & imaging results that were available during my care of the patient were reviewed by me and considered in my medical decision making (see chart for details).   1. Essential hypertension, medication refill Reviewed recent PCP note from 06/13/2024 showing prescription for lisinopril -HCTZ 20-25mg  tablets and labs showing intact renal function.  Medication refilled today with 30 days worth. Encouraged further follow-up with primary care provider for future refills. Discussed monitoring BP at home and lifestyle changes to reduce BP further.   Counseled patient on potential for adverse effects with medications prescribed/recommended today, strict ER and return-to-clinic precautions discussed, patient verbalized understanding.    Final Clinical Impressions(s) / UC Diagnoses   Final diagnoses:  Medication refill  Essential hypertension     Discharge Instructions      Your blood pressure was elevated in the clinic today. Continue taking blood pressure medication(s) as prescribed. Your BP medication has been refilled for 30 days.  Continue exercising and eating low salt diet to reduce BP naturally.  If you develop chest pain, shortness of breath, weakness on one side of your body, severe headache, dizziness, etc, please go to the ER.  Follow-up with PCP for ongoing evaluation and management of your high blood pressure.    If you develop any new or worsening symptoms or if your symptoms do not start to improve, please return here or follow-up with your primary care provider. If your symptoms are severe, please go to the emergency room.    ED Prescriptions     Medication Sig Dispense  Auth. Provider   lisinopril -hydrochlorothiazide  (ZESTORETIC ) 20-25 MG tablet Take 1 tablet by mouth daily. 30 tablet Enedelia Dorna HERO, FNP      PDMP not reviewed this encounter.    [1]  Social History Tobacco Use   Smoking status: Every Day    Current packs/day: 0.25    Types: Cigarettes   Smokeless tobacco: Never  Vaping Use   Vaping status: Never Used  Substance Use Topics   Alcohol use: Yes    Comment: occ   Drug use: No     Enedelia Dorna HERO, FNP 07/27/24 1801  "

## 2024-08-09 ENCOUNTER — Telehealth: Admitting: Emergency Medicine

## 2024-08-09 DIAGNOSIS — R3989 Other symptoms and signs involving the genitourinary system: Secondary | ICD-10-CM

## 2024-08-09 MED ORDER — CEPHALEXIN 500 MG PO CAPS: 500.0000 mg | ORAL_CAPSULE | Freq: Two times a day (BID) | ORAL | 0 refills | Status: AC

## 2024-08-09 NOTE — Patient Instructions (Signed)
" °  Brynne M Stormes, thank you for joining Jon CHRISTELLA Belt, NP for today's virtual visit.  While this provider is not your primary care provider (PCP), if your PCP is located in our provider database this encounter information will be shared with them immediately following your visit.   A [facility] MyChart account gives you access to today's visit and all your visits, tests, and labs performed at [facility]  click here if you don't have a [facility] MyChart account or go to [email or web address]  Consent: (Patient) Terri Bradford provided verbal consent for this virtual visit at the beginning of the encounter.  Current Medications:  Current Outpatient Medications:    cephALEXin  (KEFLEX ) 500 MG capsule, Take 1 capsule (500 mg total) by mouth 2 (two) times daily for 7 days., Disp: 14 capsule, Rfl: 0   Blood Pressure Monitoring (BLOOD PRESSURE KIT) DEVI, 1 kit by Does not apply route once a week. Check Blood Pressure regularly and record readings into the Babys[employer]s App.  Large Cuff.  DX O90.0, Disp: 1 each, Rfl: 0   lisinopril -hydrochlorothiazide  (ZESTORETIC ) 20-25 MG tablet, Take 1 tablet by mouth daily., Disp: 30 tablet, Rfl: 0   Medications ordered in this encounter:  Meds ordered this encounter  Medications   cephALEXin  (KEFLEX ) 500 MG capsule    Sig: Take 1 capsule (500 mg total) by mouth 2 (two) times daily for 7 days.    Dispense:  14 capsule    Refill:  0     *If you need refills on other medications prior to your next appointment, please contact your pharmacy*  Follow-Up: Call back or seek an in-person evaluation if the symptoms worsen or if the condition fails to improve as anticipated.  [facility] [phone]  Other Instructions  Continue drinking lots of fluids.   If this treatment does not help fix your symptoms or if you begin feeling worse, please be checked in person   If you have been instructed to have an in-person evaluation today  at a local Urgent Care facility, please use the link below. It will take you to a list of all of our available [facility], including address, phone number and hours of operation. Please do not delay care.  [facility]  If you or a family member do not have a primary care provider, use the link below to schedule a visit and establish care. When you choose a [facility] primary care physician or advanced practice provider, you gain a long-term partner in health. Find a Primary Care Provider  Learn more about [facility] in-office and virtual care options: [facility] - Get Care Now  "

## 2024-08-09 NOTE — Progress Notes (Signed)
 " Virtual Visit Consent   Julietta M Byrns, you are scheduled for a virtual visit with a [facility] provider today. Just as with appointments in the office, your consent must be obtained to participate. Your consent will be active for this visit and any virtual visit you may have with one of our providers in the next 365 days. If you have a MyChart account, a copy of this consent can be sent to you electronically.  As this is a virtual visit, video technology does not allow for your provider to perform a traditional examination. This may limit your provider's ability to fully assess your condition. If your provider identifies any concerns that need to be evaluated in person or the need to arrange testing (such as labs, EKG, etc.), we will make arrangements to do so. Although advances in technology are sophisticated, we cannot ensure that it will always work on either your end or our end. If the connection with a video visit is poor, the visit may have to be switched to a telephone visit. With either a video or telephone visit, we are not always able to ensure that we have a secure connection.  By engaging in this virtual visit, you consent to the provision of healthcare and authorize for your insurance to be billed (if applicable) for the services provided during this visit. Depending on your insurance coverage, you may receive a charge related to this service.  I need to obtain your verbal consent now. Are you willing to proceed with your visit today? Jamaris JONNIE KUBLY has provided verbal consent on 08/09/2024 for a virtual visit (video or telephone). Jon CHRISTELLA Belt, NP  Date: 08/09/2024 10:20 AM   Virtual Visit via Video Note   I, Jon CHRISTELLA Belt, connected with  Nyoka CHRISTELLA Broach  ([id number], May 28, 1981) on 08/09/2024 at 10:15 AM EST by a video-enabled telemedicine application and verified that I am speaking with the correct person using two identifiers.  Location: Patient: Virtual Visit Location Patient:  Home Provider: Virtual Visit Location Provider: Home Office   I discussed the limitations of evaluation and management by telemedicine and the availability of in person appointments. The patient expressed understanding and agreed to proceed.    History of Present Illness: Terri Bradford is a 44 y.o. who identifies as a female who was assigned female at birth, and is being seen today for Has a UTI. Urgency, frequency. Hurts when she urinates x2-3 days. Some back and abd pain, pain is mild. No fever or chills, no n/v. No vaginal discharge  Last UTI probably 4 years ago.   TReating sx by drinking water  and cranberry juice  HPI: HPI  Problems:  Patient Active Problem List   Diagnosis Date Noted   S/P emergency cesarean hysterectomy 07/04/2020   Thrombocytopenia 01/04/2020   History of intrauterine fetal death in previous pregnancy Jan 14, 2020   Chronic hypertension in pregnancy 01-14-2020   History of cesarean section 01-14-2020   AMA (advanced maternal age) multigravida 35+ 2020/01/14   Supervision of high risk pregnancy, antepartum 12/26/2019   Type 2 diabetes mellitus in pregnancy, second trimester 12/12/2014    Allergies: Allergies[1] Medications: Current Medications[2]  Observations/Objective: Patient is well-developed, well-nourished in no acute distress.  Resting comfortably  at home.  Head is normocephalic, atraumatic.  No labored breathing.  Speech is clear and coherent with logical content.  Patient is alert and oriented at baseline.    Assessment and Plan: 1. Suspected UTI (Primary) - cephALEXin  (KEFLEX ) 500 MG capsule;  Take 1 capsule (500 mg total) by mouth 2 (two) times daily for 7 days.  Dispense: 14 capsule; Refill: 0    Follow Up Instructions: I discussed the assessment and treatment plan with the patient. The patient was provided an opportunity to ask questions and all were answered. The patient agreed with the plan and demonstrated an understanding of the  instructions.  A copy of instructions were sent to the patient via MyChart unless otherwise noted below.   The patient was advised to call back or seek an in-person evaluation if the symptoms worsen or if the condition fails to improve as anticipated.    Jon CHRISTELLA Belt, NP    [1] No Known Allergies [2]  Current Outpatient Medications:    cephALEXin  (KEFLEX ) 500 MG capsule, Take 1 capsule (500 mg total) by mouth 2 (two) times daily for 7 days., Disp: 14 capsule, Rfl: 0   Blood Pressure Monitoring (BLOOD PRESSURE KIT) DEVI, 1 kit by Does not apply route once a week. Check Blood Pressure regularly and record readings into the [employer] App.  Large Cuff.  DX O90.0, Disp: 1 each, Rfl: 0   lisinopril -hydrochlorothiazide  (ZESTORETIC ) 20-25 MG tablet, Take 1 tablet by mouth daily., Disp: 30 tablet, Rfl: 0  "
